# Patient Record
Sex: Male | Born: 1935 | Race: White | Hispanic: No | Marital: Married | Smoking: Former smoker
Health system: Southern US, Community
[De-identification: ages and names within clinical notes are randomized; demographics above are authoritative.]

## PROBLEM LIST (undated history)

## (undated) DIAGNOSIS — K219 Gastro-esophageal reflux disease without esophagitis: Secondary | ICD-10-CM

## (undated) DIAGNOSIS — E7800 Pure hypercholesterolemia, unspecified: Secondary | ICD-10-CM

## (undated) DIAGNOSIS — N429 Disorder of prostate, unspecified: Secondary | ICD-10-CM

## (undated) DIAGNOSIS — N400 Enlarged prostate without lower urinary tract symptoms: Secondary | ICD-10-CM

## (undated) DIAGNOSIS — M419 Scoliosis, unspecified: Secondary | ICD-10-CM

## (undated) DIAGNOSIS — B341 Enterovirus infection, unspecified: Secondary | ICD-10-CM

## (undated) DIAGNOSIS — R61 Generalized hyperhidrosis: Secondary | ICD-10-CM

## (undated) DIAGNOSIS — H409 Unspecified glaucoma: Secondary | ICD-10-CM

## (undated) DIAGNOSIS — C801 Malignant (primary) neoplasm, unspecified: Secondary | ICD-10-CM

## (undated) DIAGNOSIS — M1990 Unspecified osteoarthritis, unspecified site: Secondary | ICD-10-CM

## (undated) DIAGNOSIS — J189 Pneumonia, unspecified organism: Secondary | ICD-10-CM

## (undated) DIAGNOSIS — M549 Dorsalgia, unspecified: Secondary | ICD-10-CM

## (undated) DIAGNOSIS — M1711 Unilateral primary osteoarthritis, right knee: Secondary | ICD-10-CM

## (undated) HISTORY — DX: Unspecified osteoarthritis, unspecified site: M19.90

## (undated) HISTORY — DX: Benign prostatic hyperplasia without lower urinary tract symptoms: N40.0

## (undated) HISTORY — PX: TUMOR EXCISION: SHX421

## (undated) HISTORY — PX: KNEE ARTHROSCOPY W/ MENISCECTOMY: SHX1879

## (undated) HISTORY — DX: Enterovirus infection, unspecified: B34.1

## (undated) HISTORY — DX: Generalized hyperhidrosis: R61

## (undated) HISTORY — DX: Gastro-esophageal reflux disease without esophagitis: K21.9

## (undated) HISTORY — PX: EYE SURGERY: SHX253

## (undated) HISTORY — PX: TONSILLECTOMY: SUR1361

## (undated) HISTORY — DX: Pure hypercholesterolemia, unspecified: E78.00

---

## 1967-09-17 HISTORY — PX: APPENDECTOMY: SHX54

## 2014-04-21 ENCOUNTER — Encounter (INDEPENDENT_AMBULATORY_CARE_PROVIDER_SITE_OTHER): Payer: Self-pay | Admitting: *Deleted

## 2014-05-19 ENCOUNTER — Ambulatory Visit (INDEPENDENT_AMBULATORY_CARE_PROVIDER_SITE_OTHER): Payer: Self-pay | Admitting: Internal Medicine

## 2014-08-31 ENCOUNTER — Encounter (HOSPITAL_COMMUNITY): Payer: Self-pay | Admitting: Physician Assistant

## 2014-08-31 DIAGNOSIS — M549 Dorsalgia, unspecified: Secondary | ICD-10-CM | POA: Diagnosis present

## 2014-08-31 DIAGNOSIS — H409 Unspecified glaucoma: Secondary | ICD-10-CM | POA: Diagnosis present

## 2014-08-31 DIAGNOSIS — M1711 Unilateral primary osteoarthritis, right knee: Secondary | ICD-10-CM | POA: Diagnosis present

## 2014-08-31 DIAGNOSIS — N429 Disorder of prostate, unspecified: Secondary | ICD-10-CM | POA: Diagnosis present

## 2014-08-31 DIAGNOSIS — K219 Gastro-esophageal reflux disease without esophagitis: Secondary | ICD-10-CM | POA: Diagnosis present

## 2014-08-31 NOTE — H&P (Signed)
TOTAL KNEE ADMISSION H&P  Patient is being admitted for right total knee arthroplasty.  Subjective:  Chief Complaint:right knee pain.  HPI: Jeffrey Wheeler, 78 y.o. male, has a history of pain and functional disability in the right knee due to arthritis and has failed non-surgical conservative treatments for greater than 12 weeks to includeNSAID's and/or analgesics, corticosteriod injections, viscosupplementation injections, flexibility and strengthening excercises, supervised PT with diminished ADL's post treatment, use of assistive devices and activity modification.  Onset of symptoms was gradual, starting 10 years ago with gradually worsening course since that time. The patient noted prior procedures on the knee to include  arthroscopy and menisectomy on the right knee(s).  Patient currently rates pain in the right knee(s) at 10 out of 10 with activity. Patient has night pain, worsening of pain with activity and weight bearing, pain that interferes with activities of daily living, crepitus and joint swelling.  Patient has evidence of subchondral sclerosis, periarticular osteophytes and joint space narrowing by imaging studies. There is no active infection.  Patient Active Problem List   Diagnosis Date Noted  . Primary localized osteoarthritis of right knee   . Glaucoma   . Prostate disease   . Gastroesophageal reflux disease   . Back pain    Past Medical History  Diagnosis Date  . Back pain   . Gastroesophageal reflux disease   . Prostate disease   . Glaucoma   . Primary localized osteoarthritis of right knee   . Scoliosis     Past Surgical History  Procedure Laterality Date  . Tumor excision Right 40 yrs ago    calf  . Knee arthroscopy w/ meniscectomy Right   . Appendectomy  1969    No prescriptions prior to admission  No Known Allergies   No current facility-administered medications for this encounter. Current outpatient prescriptions: acetaminophen (TYLENOL) 325 MG tablet,  Take 650 mg by mouth every 6 (six) hours as needed., Disp: , Rfl: ;  aspirin EC 81 MG tablet, Take 81 mg by mouth daily., Disp: , Rfl: ;  doxazosin (CARDURA) 8 MG tablet, Take 8 mg by mouth daily., Disp: , Rfl: ;  etodolac (LODINE) 400 MG tablet, Take 400 mg by mouth 2 (two) times daily., Disp: , Rfl:  finasteride (PROSCAR) 5 MG tablet, Take 5 mg by mouth daily., Disp: , Rfl: ;  HYDROcodone-acetaminophen (NORCO/VICODIN) 5-325 MG per tablet, Take 1 tablet by mouth every 6 (six) hours as needed for moderate pain., Disp: , Rfl: ;  latanoprost (XALATAN) 0.005 % ophthalmic solution, Place 1 drop into both eyes at bedtime., Disp: , Rfl: ;  mirabegron ER (MYRBETRIQ) 25 MG TB24 tablet, Take 25 mg by mouth daily., Disp: , Rfl:  pantoprazole (PROTONIX) 40 MG tablet, Take 40 mg by mouth daily., Disp: , Rfl: ;  senna (SENOKOT) 8.6 MG tablet, Take 1 tablet by mouth daily., Disp: , Rfl: ;  dorzolamide-timolol (COSOPT) 22.3-6.8 MG/ML ophthalmic solution, , Disp: , Rfl: 12 History  Substance Use Topics  . Smoking status: Never Smoker   . Smokeless tobacco: Former Systems developer    Quit date: 08/31/2000  . Alcohol Use: 0.0 oz/week    0 Not specified per week    Family History  Problem Relation Age of Onset  . Diabetes    . CAD    . Hypertension    . CVA       Review of Systems  Constitutional: Negative.   HENT: Negative.   Eyes: Negative.   Gastrointestinal: Positive for nausea,  abdominal pain and constipation. Negative for diarrhea.       Left lower quadrant  Genitourinary: Negative for dysuria, urgency, frequency, hematuria and flank pain.  Musculoskeletal: Positive for back pain and joint pain.  Skin: Negative.   Neurological: Negative for dizziness, tingling, tremors, sensory change, speech change, focal weakness, seizures and loss of consciousness.  Endo/Heme/Allergies: Negative for environmental allergies and polydipsia. Does not bruise/bleed easily.  Psychiatric/Behavioral: Negative.      Objective:  Physical Exam  Constitutional: He is oriented to person, place, and time. He appears well-developed and well-nourished.  GI: Soft. There is tenderness.  Left lower quadrant pain  Musculoskeletal:  Examination of his right knee reveals pain medially and laterally, 1+ crepitation 1+ synovitis full range of motion knee is stable with normal patella tracking. Exam of the left knee reveals full range of motion without pain swelling weakness or instability. Vascular exam: pulses 2+ and symmetric.   Neurological: He is alert and oriented to person, place, and time.  Skin: Skin is warm and dry.  Psychiatric: He has a normal mood and affect. His behavior is normal.    Vital signs in last 24 hours: Pulse Rate:  [69] 69 (12/16 1500) BP: (155)/(88) 155/88 mmHg (12/16 1500) SpO2:  [96 %] 96 % (12/16 1500) Weight:  [94.802 kg (209 lb)] 94.802 kg (209 lb) (12/16 1500)  Labs:   Estimated body mass index is 31.79 kg/(m^2) as calculated from the following:   Height as of this encounter: 5\' 8"  (1.727 m).   Weight as of this encounter: 94.802 kg (209 lb).   Imaging Review Plain radiographs demonstrate severe degenerative joint disease of the right knee(s). The overall alignment issignificant varus. The bone quality appears to be good for age and reported activity level.  Assessment/Plan:  End stage arthritis, right knee  Active Problems:   Primary localized osteoarthritis of right knee   Glaucoma   Prostate disease   Gastroesophageal reflux disease   Back pain   The patient history, physical examination, clinical judgment of the provider and imaging studies are consistent with end stage degenerative joint disease of the right knee(s) and total knee arthroplasty is deemed medically necessary. The treatment options including medical management, injection therapy arthroscopy and arthroplasty were discussed at length. The risks and benefits of total knee arthroplasty were presented  and reviewed. The risks due to aseptic loosening, infection, stiffness, patella tracking problems, thromboembolic complications and other imponderables were discussed. The patient acknowledged the explanation, agreed to proceed with the plan and consent was signed. Patient is being admitted for inpatient treatment for surgery, pain control, PT, OT, prophylactic antibiotics, VTE prophylaxis, progressive ambulation and ADL's and discharge planning. The patient is planning to be discharged home with home health services  Lendy Dittrich A. Kaleen Mask Physician Assistant [facility] [phone]  08/31/2014, 3:26 PM

## 2014-09-02 ENCOUNTER — Encounter (HOSPITAL_COMMUNITY): Payer: Self-pay

## 2014-09-02 ENCOUNTER — Encounter (HOSPITAL_COMMUNITY)
Admission: RE | Admit: 2014-09-02 | Discharge: 2014-09-02 | Disposition: A | Discharge status: Home / Self Care | Payer: Medicare HMO | Source: Ambulatory Visit | Attending: Orthopedic Surgery | Admitting: Orthopedic Surgery

## 2014-09-02 DIAGNOSIS — Z01818 Encounter for other preprocedural examination: Secondary | ICD-10-CM | POA: Insufficient documentation

## 2014-09-02 LAB — APTT: aPTT: 31 seconds (ref 24–37)

## 2014-09-02 LAB — COMPREHENSIVE METABOLIC PANEL
ALT: 14 U/L (ref 0–53)
ANION GAP: 10 (ref 5–15)
AST: 16 U/L (ref 0–37)
Albumin: 3.4 g/dL — ABNORMAL LOW (ref 3.5–5.2)
Alkaline Phosphatase: 111 U/L (ref 39–117)
BUN: 18 mg/dL (ref 6–23)
CALCIUM: 9.7 mg/dL (ref 8.4–10.5)
CO2: 27 meq/L (ref 19–32)
Chloride: 101 mEq/L (ref 96–112)
Creatinine, Ser: 1.01 mg/dL (ref 0.50–1.35)
GFR calc Af Amer: 82 mL/min — ABNORMAL LOW (ref >90)
GFR, EST NON AFRICAN AMERICAN: 71 mL/min — AB (ref >90)
Glucose, Bld: 85 mg/dL (ref 70–99)
Potassium: 4.7 mEq/L (ref 3.7–5.3)
Sodium: 138 mEq/L (ref 137–147)
TOTAL PROTEIN: 6.6 g/dL (ref 6.0–8.3)
Total Bilirubin: 0.3 mg/dL (ref 0.3–1.2)

## 2014-09-02 LAB — CBC WITH DIFFERENTIAL/PLATELET
Basophils Absolute: 0 10*3/uL (ref 0.0–0.1)
Basophils Relative: 0 % (ref 0–1)
EOS ABS: 0.2 10*3/uL (ref 0.0–0.7)
EOS PCT: 3 % (ref 0–5)
HCT: 44.1 % (ref 39.0–52.0)
HEMOGLOBIN: 14.8 g/dL (ref 13.0–17.0)
LYMPHS ABS: 1.8 10*3/uL (ref 0.7–4.0)
Lymphocytes Relative: 22 % (ref 12–46)
MCH: 29.5 pg (ref 26.0–34.0)
MCHC: 33.6 g/dL (ref 30.0–36.0)
MCV: 88 fL (ref 78.0–100.0)
Monocytes Absolute: 0.7 10*3/uL (ref 0.1–1.0)
Monocytes Relative: 8 % (ref 3–12)
Neutro Abs: 5.3 10*3/uL (ref 1.7–7.7)
Neutrophils Relative %: 67 % (ref 43–77)
Platelets: 234 10*3/uL (ref 150–400)
RBC: 5.01 MIL/uL (ref 4.22–5.81)
RDW: 13.8 % (ref 11.5–15.5)
WBC: 8 10*3/uL (ref 4.0–10.5)

## 2014-09-02 LAB — URINALYSIS, ROUTINE W REFLEX MICROSCOPIC
Glucose, UA: NEGATIVE mg/dL
HGB URINE DIPSTICK: NEGATIVE
Ketones, ur: NEGATIVE mg/dL
Leukocytes, UA: NEGATIVE
Nitrite: NEGATIVE
PROTEIN: NEGATIVE mg/dL
SPECIFIC GRAVITY, URINE: 1.026 (ref 1.005–1.030)
Urobilinogen, UA: 0.2 mg/dL (ref 0.0–1.0)
pH: 5 (ref 5.0–8.0)

## 2014-09-02 LAB — TYPE AND SCREEN
ABO/RH(D): O POS
Antibody Screen: NEGATIVE

## 2014-09-02 LAB — SURGICAL PCR SCREEN
MRSA, PCR: NEGATIVE
Staphylococcus aureus: NEGATIVE

## 2014-09-02 LAB — PROTIME-INR
INR: 1 (ref 0.00–1.49)
Prothrombin Time: 13.3 seconds (ref 11.6–15.2)

## 2014-09-02 LAB — ABO/RH: ABO/RH(D): O POS

## 2014-09-02 NOTE — Pre-Procedure Instructions (Signed)
Birney Belshe  09/02/2014   Your procedure is scheduled on:  Monday, Dec. 28th   Report to [facility] Admitting at  8:30 AM.  Call this number if you have problems the morning of surgery: [phone]   Remember:   Do not eat food or drink liquids after midnight Sunday.   Take these medicines the morning of surgery with A SIP OF WATER: Cardura, Proscar, Hydrocodone, Protonix   Do not wear jewelry - no rings or watches.  Do not wear lotions or colognes.   You may NOT wear deodorant the morning of surgery.   Men may shave face and neck.   Do not bring valuables to the hospital.  [facility] is not responsible  for any belongings or valuables.               Contacts, dentures or bridgework may not be worn into surgery.  Leave suitcase in the car. After surgery it may be brought to your room.  For patients admitted to the hospital, discharge time is determined by your treatment team.               Name and phone number of your driver:    Special Instructions: "Preparing for Surgery" Instruction Sheet.   Please read over the following fact sheets that you were given: Pain Booklet, Coughing and Deep Breathing, Blood Transfusion Information, MRSA Information and Surgical Site Infection Prevention

## 2014-09-02 NOTE — Progress Notes (Signed)
Cardura is taken for his prostate per his wife.

## 2014-09-03 LAB — URINE CULTURE: Colony Count: 4000

## 2014-09-11 MED ORDER — CEFAZOLIN SODIUM-DEXTROSE 2-3 GM-% IV SOLR
2.0000 g | INTRAVENOUS | Status: AC
  Administered 2014-09-12: 2 g via INTRAVENOUS
  Filled 2014-09-11: qty 50

## 2014-09-11 MED ORDER — CHLORHEXIDINE GLUCONATE 4 % EX LIQD
60.0000 mL | Freq: Once | CUTANEOUS | Status: DC
  Filled 2014-09-11: qty 60

## 2014-09-11 MED ORDER — LACTATED RINGERS IV SOLN
INTRAVENOUS | Status: DC
  Administered 2014-09-12: 09:00:00 via INTRAVENOUS

## 2014-09-11 MED ORDER — POVIDONE-IODINE 7.5 % EX SOLN
Freq: Once | CUTANEOUS | Status: DC
  Filled 2014-09-11: qty 118

## 2014-09-12 ENCOUNTER — Encounter (HOSPITAL_COMMUNITY)
Admission: RE | Disposition: A | Discharge status: Home / Self Care | Payer: Self-pay | Source: Ambulatory Visit | Attending: Orthopedic Surgery

## 2014-09-12 ENCOUNTER — Inpatient Hospital Stay (HOSPITAL_COMMUNITY)
Admission: RE | Admit: 2014-09-12 | Discharge: 2014-09-14 | DRG: 470 | Disposition: A | Discharge status: Home / Self Care | Payer: Medicare HMO | Source: Ambulatory Visit | Attending: Orthopedic Surgery | Admitting: Orthopedic Surgery

## 2014-09-12 ENCOUNTER — Inpatient Hospital Stay (HOSPITAL_COMMUNITY): Payer: Medicare HMO | Admitting: Certified Registered Nurse Anesthetist

## 2014-09-12 ENCOUNTER — Encounter (HOSPITAL_COMMUNITY): Payer: Self-pay | Admitting: Certified Registered Nurse Anesthetist

## 2014-09-12 DIAGNOSIS — M1711 Unilateral primary osteoarthritis, right knee: Principal | ICD-10-CM | POA: Diagnosis present

## 2014-09-12 DIAGNOSIS — H409 Unspecified glaucoma: Secondary | ICD-10-CM | POA: Diagnosis present

## 2014-09-12 DIAGNOSIS — M1710 Unilateral primary osteoarthritis, unspecified knee: Secondary | ICD-10-CM | POA: Diagnosis present

## 2014-09-12 DIAGNOSIS — Z87891 Personal history of nicotine dependence: Secondary | ICD-10-CM | POA: Diagnosis not present

## 2014-09-12 DIAGNOSIS — N429 Disorder of prostate, unspecified: Secondary | ICD-10-CM | POA: Diagnosis present

## 2014-09-12 DIAGNOSIS — R1032 Left lower quadrant pain: Secondary | ICD-10-CM | POA: Diagnosis present

## 2014-09-12 DIAGNOSIS — M25561 Pain in right knee: Secondary | ICD-10-CM | POA: Diagnosis present

## 2014-09-12 DIAGNOSIS — M179 Osteoarthritis of knee, unspecified: Secondary | ICD-10-CM | POA: Diagnosis present

## 2014-09-12 DIAGNOSIS — K219 Gastro-esophageal reflux disease without esophagitis: Secondary | ICD-10-CM | POA: Diagnosis present

## 2014-09-12 DIAGNOSIS — R822 Biliuria: Secondary | ICD-10-CM | POA: Diagnosis present

## 2014-09-12 DIAGNOSIS — R198 Other specified symptoms and signs involving the digestive system and abdomen: Secondary | ICD-10-CM | POA: Diagnosis present

## 2014-09-12 DIAGNOSIS — M549 Dorsalgia, unspecified: Secondary | ICD-10-CM | POA: Diagnosis present

## 2014-09-12 HISTORY — DX: Gastro-esophageal reflux disease without esophagitis: K21.9

## 2014-09-12 HISTORY — DX: Unspecified glaucoma: H40.9

## 2014-09-12 HISTORY — DX: Dorsalgia, unspecified: M54.9

## 2014-09-12 HISTORY — DX: Disorder of prostate, unspecified: N42.9

## 2014-09-12 HISTORY — DX: Scoliosis, unspecified: M41.9

## 2014-09-12 HISTORY — DX: Unilateral primary osteoarthritis, right knee: M17.11

## 2014-09-12 HISTORY — PX: TOTAL KNEE ARTHROPLASTY: SHX125

## 2014-09-12 SURGERY — ARTHROPLASTY, KNEE, TOTAL
Anesthesia: Monitor Anesthesia Care | Site: Knee | Laterality: Right

## 2014-09-12 MED ORDER — ONDANSETRON HCL 4 MG/2ML IJ SOLN
INTRAMUSCULAR | Status: DC | PRN
  Administered 2014-09-12: 4 mg via INTRAVENOUS

## 2014-09-12 MED ORDER — MIDAZOLAM HCL 5 MG/5ML IJ SOLN
INTRAMUSCULAR | Status: DC | PRN
  Administered 2014-09-12 (×2): 1 mg via INTRAVENOUS

## 2014-09-12 MED ORDER — OXYCODONE HCL 5 MG PO TABS: 5.0000 mg | ORAL_TABLET | Freq: Once | ORAL | Status: DC | PRN

## 2014-09-12 MED ORDER — DORZOLAMIDE HCL-TIMOLOL MAL 2-0.5 % OP SOLN
1.0000 [drp] | Freq: Every day | OPHTHALMIC | Status: DC
  Administered 2014-09-12 – 2014-09-13 (×2): 1 [drp] via OPHTHALMIC
  Filled 2014-09-12: qty 10

## 2014-09-12 MED ORDER — ONDANSETRON HCL 4 MG/2ML IJ SOLN
INTRAMUSCULAR | Status: AC
  Filled 2014-09-12: qty 2

## 2014-09-12 MED ORDER — PANTOPRAZOLE SODIUM 40 MG PO TBEC
40.0000 mg | DELAYED_RELEASE_TABLET | Freq: Every day | ORAL | Status: DC
  Administered 2014-09-12 – 2014-09-14 (×3): 40 mg via ORAL
  Filled 2014-09-12 (×3): qty 1

## 2014-09-12 MED ORDER — CEFAZOLIN SODIUM-DEXTROSE 2-3 GM-% IV SOLR
INTRAVENOUS | Status: AC
  Filled 2014-09-12: qty 50

## 2014-09-12 MED ORDER — DIPHENHYDRAMINE HCL 12.5 MG/5ML PO ELIX: 12.5000 mg | ORAL_SOLUTION | ORAL | Status: DC | PRN

## 2014-09-12 MED ORDER — CEFAZOLIN SODIUM-DEXTROSE 2-3 GM-% IV SOLR
2.0000 g | Freq: Four times a day (QID) | INTRAVENOUS | Status: AC
  Administered 2014-09-12 (×2): 2 g via INTRAVENOUS
  Filled 2014-09-12: qty 50

## 2014-09-12 MED ORDER — FINASTERIDE 5 MG PO TABS
5.0000 mg | ORAL_TABLET | Freq: Every day | ORAL | Status: DC
  Administered 2014-09-12 – 2014-09-14 (×3): 5 mg via ORAL
  Filled 2014-09-12 (×3): qty 1

## 2014-09-12 MED ORDER — LACTATED RINGERS IV SOLN
INTRAVENOUS | Status: DC | PRN
  Administered 2014-09-12 (×3): via INTRAVENOUS

## 2014-09-12 MED ORDER — METOCLOPRAMIDE HCL 5 MG/ML IJ SOLN: 5.0000 mg | Freq: Three times a day (TID) | INTRAMUSCULAR | Status: DC | PRN

## 2014-09-12 MED ORDER — SENNA 8.6 MG PO TABS
1.0000 | ORAL_TABLET | Freq: Two times a day (BID) | ORAL | Status: DC
  Administered 2014-09-12 – 2014-09-13 (×3): 8.6 mg via ORAL
  Filled 2014-09-12 (×5): qty 1

## 2014-09-12 MED ORDER — FENTANYL CITRATE 0.05 MG/ML IJ SOLN
INTRAMUSCULAR | Status: DC | PRN
  Administered 2014-09-12 (×2): 50 ug via INTRAVENOUS
  Administered 2014-09-12: 25 ug via INTRAVENOUS
  Administered 2014-09-12 (×2): 50 ug via INTRAVENOUS
  Administered 2014-09-12: 25 ug via INTRAVENOUS

## 2014-09-12 MED ORDER — MIDAZOLAM HCL 2 MG/2ML IJ SOLN
INTRAMUSCULAR | Status: AC
  Filled 2014-09-12: qty 2

## 2014-09-12 MED ORDER — HYDROMORPHONE HCL 1 MG/ML IJ SOLN
INTRAMUSCULAR | Status: AC
  Filled 2014-09-12: qty 1

## 2014-09-12 MED ORDER — MIRABEGRON ER 25 MG PO TB24
25.0000 mg | ORAL_TABLET | Freq: Every day | ORAL | Status: DC
  Administered 2014-09-12 – 2014-09-14 (×3): 25 mg via ORAL
  Filled 2014-09-12 (×4): qty 1

## 2014-09-12 MED ORDER — ALUM & MAG HYDROXIDE-SIMETH 200-200-20 MG/5ML PO SUSP
30.0000 mL | ORAL | Status: DC | PRN
  Administered 2014-09-13: 30 mL via ORAL
  Filled 2014-09-12: qty 30

## 2014-09-12 MED ORDER — OXYCODONE HCL 5 MG PO TABS
ORAL_TABLET | ORAL | Status: AC
  Filled 2014-09-12: qty 2

## 2014-09-12 MED ORDER — LATANOPROST 0.005 % OP SOLN
1.0000 [drp] | Freq: Every day | OPHTHALMIC | Status: DC
  Administered 2014-09-12 – 2014-09-13 (×2): 1 [drp] via OPHTHALMIC
  Filled 2014-09-12: qty 2.5

## 2014-09-12 MED ORDER — EPHEDRINE SULFATE 50 MG/ML IJ SOLN
INTRAMUSCULAR | Status: AC
  Filled 2014-09-12: qty 1

## 2014-09-12 MED ORDER — MENTHOL 3 MG MT LOZG: 1.0000 | LOZENGE | OROMUCOSAL | Status: DC | PRN

## 2014-09-12 MED ORDER — ONDANSETRON HCL 4 MG PO TABS: 4.0000 mg | ORAL_TABLET | Freq: Four times a day (QID) | ORAL | Status: DC | PRN

## 2014-09-12 MED ORDER — ACETAMINOPHEN 325 MG PO TABS: 325.0000 mg | ORAL_TABLET | ORAL | Status: DC | PRN

## 2014-09-12 MED ORDER — PROPOFOL 10 MG/ML IV BOLUS
INTRAVENOUS | Status: DC | PRN
  Administered 2014-09-12 (×2): 20 mg via INTRAVENOUS
  Administered 2014-09-12: 300 mg via INTRAVENOUS
  Administered 2014-09-12: 20 mg via INTRAVENOUS

## 2014-09-12 MED ORDER — SODIUM CHLORIDE 0.9 % IR SOLN
Status: DC | PRN
  Administered 2014-09-12: 1000 mL

## 2014-09-12 MED ORDER — METOCLOPRAMIDE HCL 10 MG PO TABS: 5.0000 mg | ORAL_TABLET | Freq: Three times a day (TID) | ORAL | Status: DC | PRN

## 2014-09-12 MED ORDER — RIVAROXABAN 10 MG PO TABS
10.0000 mg | ORAL_TABLET | Freq: Every day | ORAL | Status: DC
  Administered 2014-09-13 – 2014-09-14 (×2): 10 mg via ORAL
  Filled 2014-09-12 (×3): qty 1

## 2014-09-12 MED ORDER — ONDANSETRON HCL 4 MG/2ML IJ SOLN: 4.0000 mg | Freq: Once | INTRAMUSCULAR | Status: DC | PRN

## 2014-09-12 MED ORDER — FENTANYL CITRATE 0.05 MG/ML IJ SOLN
INTRAMUSCULAR | Status: AC
  Filled 2014-09-12: qty 2

## 2014-09-12 MED ORDER — OXYCODONE HCL 5 MG/5ML PO SOLN: 5.0000 mg | Freq: Once | ORAL | Status: DC | PRN

## 2014-09-12 MED ORDER — PHENYLEPHRINE HCL 10 MG/ML IJ SOLN
INTRAMUSCULAR | Status: DC | PRN
  Administered 2014-09-12 (×2): 80 ug via INTRAVENOUS

## 2014-09-12 MED ORDER — OXYCODONE HCL 5 MG PO TABS
5.0000 mg | ORAL_TABLET | ORAL | Status: DC | PRN
  Administered 2014-09-12 – 2014-09-14 (×10): 10 mg via ORAL
  Filled 2014-09-12 (×9): qty 2

## 2014-09-12 MED ORDER — HYDROMORPHONE HCL 1 MG/ML IJ SOLN
0.2500 mg | INTRAMUSCULAR | Status: DC | PRN
  Administered 2014-09-12 (×4): 0.5 mg via INTRAVENOUS

## 2014-09-12 MED ORDER — EPHEDRINE SULFATE 50 MG/ML IJ SOLN
INTRAMUSCULAR | Status: DC | PRN
  Administered 2014-09-12 (×2): 10 mg via INTRAVENOUS
  Administered 2014-09-12: 5 mg via INTRAVENOUS

## 2014-09-12 MED ORDER — PROPOFOL 10 MG/ML IV BOLUS
INTRAVENOUS | Status: AC
  Filled 2014-09-12: qty 20

## 2014-09-12 MED ORDER — SODIUM CHLORIDE 0.9 % IR SOLN
Status: DC | PRN
  Administered 2014-09-12: 3000 mL

## 2014-09-12 MED ORDER — FENTANYL CITRATE 0.05 MG/ML IJ SOLN
INTRAMUSCULAR | Status: AC
  Filled 2014-09-12: qty 5

## 2014-09-12 MED ORDER — POTASSIUM CHLORIDE IN NACL 20-0.9 MEQ/L-% IV SOLN
INTRAVENOUS | Status: DC
  Administered 2014-09-13: 10:00:00 via INTRAVENOUS
  Filled 2014-09-12 (×8): qty 1000

## 2014-09-12 MED ORDER — BUPIVACAINE-EPINEPHRINE 0.25% -1:200000 IJ SOLN
INTRAMUSCULAR | Status: DC | PRN
  Administered 2014-09-12: 30 mL

## 2014-09-12 MED ORDER — DOXAZOSIN MESYLATE 8 MG PO TABS
8.0000 mg | ORAL_TABLET | Freq: Every day | ORAL | Status: DC
  Administered 2014-09-12 – 2014-09-14 (×3): 8 mg via ORAL
  Filled 2014-09-12 (×3): qty 1

## 2014-09-12 MED ORDER — ACETAMINOPHEN 160 MG/5ML PO SOLN
325.0000 mg | ORAL | Status: DC | PRN
  Filled 2014-09-12: qty 20.3

## 2014-09-12 MED ORDER — DEXAMETHASONE SODIUM PHOSPHATE 10 MG/ML IJ SOLN
10.0000 mg | Freq: Three times a day (TID) | INTRAMUSCULAR | Status: DC
  Administered 2014-09-12 – 2014-09-14 (×6): 10 mg via INTRAVENOUS
  Filled 2014-09-12 (×8): qty 1

## 2014-09-12 MED ORDER — ACETAMINOPHEN 325 MG PO TABS: 650.0000 mg | ORAL_TABLET | Freq: Four times a day (QID) | ORAL | Status: DC | PRN

## 2014-09-12 MED ORDER — HYDROMORPHONE HCL 1 MG/ML IJ SOLN
1.0000 mg | INTRAMUSCULAR | Status: DC | PRN
  Administered 2014-09-12 – 2014-09-13 (×3): 1 mg via INTRAVENOUS
  Filled 2014-09-12 (×3): qty 1

## 2014-09-12 MED ORDER — CELECOXIB 200 MG PO CAPS
200.0000 mg | ORAL_CAPSULE | Freq: Two times a day (BID) | ORAL | Status: DC
  Administered 2014-09-12 – 2014-09-14 (×4): 200 mg via ORAL
  Filled 2014-09-12 (×5): qty 1

## 2014-09-12 MED ORDER — PHENOL 1.4 % MT LIQD: 1.0000 | OROMUCOSAL | Status: DC | PRN

## 2014-09-12 MED ORDER — SODIUM CHLORIDE 0.9 % IJ SOLN
INTRAMUSCULAR | Status: AC
  Filled 2014-09-12: qty 10

## 2014-09-12 MED ORDER — BUPIVACAINE-EPINEPHRINE (PF) 0.25% -1:200000 IJ SOLN
INTRAMUSCULAR | Status: AC
  Filled 2014-09-12: qty 30

## 2014-09-12 MED ORDER — ACETAMINOPHEN 650 MG RE SUPP: 650.0000 mg | Freq: Four times a day (QID) | RECTAL | Status: DC | PRN

## 2014-09-12 MED ORDER — WHITE PETROLATUM GEL
Status: AC
  Filled 2014-09-12: qty 5

## 2014-09-12 MED ORDER — ONDANSETRON HCL 4 MG/2ML IJ SOLN
4.0000 mg | Freq: Four times a day (QID) | INTRAMUSCULAR | Status: DC | PRN
  Administered 2014-09-12: 4 mg via INTRAVENOUS

## 2014-09-12 SURGICAL SUPPLY — 66 items
BANDAGE ESMARK 6X9 LF (GAUZE/BANDAGES/DRESSINGS) ×1 IMPLANT
BENZOIN TINCTURE PRP APPL 2/3 (GAUZE/BANDAGES/DRESSINGS) ×2 IMPLANT
BLADE SAGITTAL 25.0X1.19X90 (BLADE) ×2 IMPLANT
BLADE SAW SGTL 11.0X1.19X90.0M (BLADE) IMPLANT
BLADE SAW SGTL 13.0X1.19X90.0M (BLADE) ×2 IMPLANT
BLADE SURG 10 STRL SS (BLADE) ×2 IMPLANT
BNDG ELASTIC 6X15 VLCR STRL LF (GAUZE/BANDAGES/DRESSINGS) ×2 IMPLANT
BNDG ESMARK 6X9 LF (GAUZE/BANDAGES/DRESSINGS) ×2
BOWL SMART MIX CTS (DISPOSABLE) ×2 IMPLANT
CAP KNEE TOTAL 3 SIGMA ×2 IMPLANT
CEMENT HV SMART SET (Cement) ×4 IMPLANT
COVER SURGICAL LIGHT HANDLE (MISCELLANEOUS) ×2 IMPLANT
CUFF TOURNIQUET SINGLE 34IN LL (TOURNIQUET CUFF) ×2 IMPLANT
CUFF TOURNIQUET SINGLE 44IN (TOURNIQUET CUFF) IMPLANT
DRAPE EXTREMITY T 121X128X90 (DRAPE) ×2 IMPLANT
DRAPE IMP U-DRAPE 54X76 (DRAPES) ×2 IMPLANT
DRAPE INCISE IOBAN 66X45 STRL (DRAPES) ×2 IMPLANT
DRAPE PROXIMA HALF (DRAPES) ×2 IMPLANT
DRAPE U-SHAPE 47X51 STRL (DRAPES) ×2 IMPLANT
DRSG AQUACEL AG ADV 3.5X14 (GAUZE/BANDAGES/DRESSINGS) ×2 IMPLANT
DRSG PAD ABDOMINAL 8X10 ST (GAUZE/BANDAGES/DRESSINGS) ×4 IMPLANT
DURAPREP 26ML APPLICATOR (WOUND CARE) ×4 IMPLANT
ELECT CAUTERY BLADE 6.4 (BLADE) ×2 IMPLANT
ELECT REM PT RETURN 9FT ADLT (ELECTROSURGICAL) ×2
ELECTRODE REM PT RTRN 9FT ADLT (ELECTROSURGICAL) ×1 IMPLANT
EVACUATOR 1/8 PVC DRAIN (DRAIN) ×2 IMPLANT
FACESHIELD WRAPAROUND (MASK) ×4 IMPLANT
GAUZE SPONGE 4X4 12PLY STRL (GAUZE/BANDAGES/DRESSINGS) ×2 IMPLANT
GLOVE BIO SURGEON STRL SZ7 (GLOVE) ×2 IMPLANT
GLOVE BIOGEL PI IND STRL 7.0 (GLOVE) ×1 IMPLANT
GLOVE BIOGEL PI IND STRL 7.5 (GLOVE) ×1 IMPLANT
GLOVE BIOGEL PI INDICATOR 7.0 (GLOVE) ×1
GLOVE BIOGEL PI INDICATOR 7.5 (GLOVE) ×1
GLOVE SS BIOGEL STRL SZ 7.5 (GLOVE) ×1 IMPLANT
GLOVE SUPERSENSE BIOGEL SZ 7.5 (GLOVE) ×1
GOWN STRL REUS W/ TWL LRG LVL3 (GOWN DISPOSABLE) ×2 IMPLANT
GOWN STRL REUS W/ TWL XL LVL3 (GOWN DISPOSABLE) ×2 IMPLANT
GOWN STRL REUS W/TWL LRG LVL3 (GOWN DISPOSABLE) ×2
GOWN STRL REUS W/TWL XL LVL3 (GOWN DISPOSABLE) ×2
HANDPIECE INTERPULSE COAX TIP (DISPOSABLE) ×1
HOOD PEEL AWAY FACE SHEILD DIS (HOOD) ×4 IMPLANT
IMMOBILIZER KNEE 22 UNIV (SOFTGOODS) ×2 IMPLANT
KIT BASIN OR (CUSTOM PROCEDURE TRAY) ×2 IMPLANT
KIT ROOM TURNOVER OR (KITS) ×2 IMPLANT
MANIFOLD NEPTUNE II (INSTRUMENTS) ×2 IMPLANT
MARKER SKIN DUAL TIP RULER LAB (MISCELLANEOUS) ×2 IMPLANT
NS IRRIG 1000ML POUR BTL (IV SOLUTION) ×2 IMPLANT
PACK TOTAL JOINT (CUSTOM PROCEDURE TRAY) ×2 IMPLANT
PACK UNIVERSAL I (CUSTOM PROCEDURE TRAY) ×2 IMPLANT
PAD ARMBOARD 7.5X6 YLW CONV (MISCELLANEOUS) ×4 IMPLANT
PADDING CAST COTTON 6X4 STRL (CAST SUPPLIES) ×2 IMPLANT
RUBBERBAND STERILE (MISCELLANEOUS) ×2 IMPLANT
SET HNDPC FAN SPRY TIP SCT (DISPOSABLE) ×1 IMPLANT
STRIP CLOSURE SKIN 1/2X4 (GAUZE/BANDAGES/DRESSINGS) ×4 IMPLANT
SUCTION FRAZIER TIP 10 FR DISP (SUCTIONS) ×2 IMPLANT
SUT ETHIBOND NAB CT1 #1 30IN (SUTURE) ×2 IMPLANT
SUT MNCRL AB 3-0 PS2 18 (SUTURE) ×2 IMPLANT
SUT VIC AB 0 CT1 27 (SUTURE) ×2
SUT VIC AB 0 CT1 27XBRD ANBCTR (SUTURE) ×2 IMPLANT
SUT VIC AB 2-0 CT1 27 (SUTURE) ×2
SUT VIC AB 2-0 CT1 TAPERPNT 27 (SUTURE) ×2 IMPLANT
SYR 30ML SLIP (SYRINGE) ×2 IMPLANT
TOWEL OR 17X24 6PK STRL BLUE (TOWEL DISPOSABLE) ×2 IMPLANT
TOWEL OR 17X26 10 PK STRL BLUE (TOWEL DISPOSABLE) ×2 IMPLANT
TRAY FOLEY CATH 16FR SILVER (SET/KITS/TRAYS/PACK) ×2 IMPLANT
WATER STERILE IRR 1000ML POUR (IV SOLUTION) ×2 IMPLANT

## 2014-09-12 NOTE — Progress Notes (Signed)
Utilization review completed.  

## 2014-09-12 NOTE — Transfer of Care (Signed)
Immediate Anesthesia Transfer of Care Note  Patient: Jeffrey Wheeler  Procedure(s) Performed: Procedure(s): RIGHT TOTAL KNEE ARTHROPLASTY (Right)  Patient Location: PACU  Anesthesia Type:General  Level of Consciousness: awake, alert , oriented and patient cooperative  Airway & Oxygen Therapy: Patient Spontanous Breathing and Patient connected to face mask oxygen  Post-op Assessment: Report given to PACU RN, Post -op Vital signs reviewed and stable and Patient moving all extremities  Post vital signs: Reviewed and stable  Complications: No apparent anesthesia complications

## 2014-09-12 NOTE — Anesthesia Postprocedure Evaluation (Signed)
  Anesthesia Post-op Note  Patient: Jeffrey Wheeler  Procedure(s) Performed: Procedure(s): RIGHT TOTAL KNEE ARTHROPLASTY (Right)  Patient Location: PACU  Anesthesia Type:General and GA combined with regional for post-op pain  Level of Consciousness: awake, alert  and oriented  Airway and Oxygen Therapy: Patient Spontanous Breathing and Patient connected to nasal cannula oxygen  Post-op Pain: none  Post-op Assessment: Post-op Vital signs reviewed, Patient's Cardiovascular Status Stable, Respiratory Function Stable and Patent Airway  Post-op Vital Signs: stable  Last Vitals:  Filed Vitals:   09/12/14 1500  BP: 150/92  Pulse: 67  Temp: 36.8 C  Resp: 17    Complications: No apparent anesthesia complications

## 2014-09-12 NOTE — Anesthesia Procedure Notes (Signed)
Anesthesia Regional Block:  Adductor canal block  Pre-Anesthetic Checklist: ,, timeout performed, Correct Patient, Correct Site, Correct Laterality, Correct Procedure, Correct Position, site marked, Risks and benefits discussed,  Surgical consent,  Pre-op evaluation,  At surgeon's request and post-op pain management  Laterality: Right  Prep: chloraprep       Needles:  Injection technique: Single-shot     Needle Length: 9cm 9 cm Needle Gauge: 22 and 22 G    Additional Needles:  Procedures: ultrasound guided (picture in chart) Adductor canal block Narrative:  Start time: 09/12/2014 10:05 AM End time: 09/12/2014 10:10 AM Injection made incrementally with aspirations every 5 mL.  Performed by: Personally   Additional Notes: 30 cc 0.5% Marcaine with 1:200 Epi injected easily

## 2014-09-12 NOTE — Interval H&P Note (Signed)
History and Physical Interval Note:  09/12/2014 10:27 AM  Jeffrey Wheeler  has presented today for surgery, with the diagnosis of PRIMARY LOCALIZED OA RIGHT KNEE  The various methods of treatment have been discussed with the patient and family. After consideration of risks, benefits and other options for treatment, the patient has consented to  Procedure(s): RIGHT TOTAL KNEE ARTHROPLASTY (Right) as a surgical intervention .  The patient's history has been reviewed, patient examined, no change in status, stable for surgery.  I have reviewed the patient's chart and labs.  Questions were answered to the patient's satisfaction.     Elsie Saas A

## 2014-09-12 NOTE — Op Note (Signed)
MRN:     [id number] DOB/AGE:    [birth year] / 78 y.o.       OPERATIVE REPORT    DATE OF PROCEDURE:  09/12/2014       PREOPERATIVE DIAGNOSIS:   PRIMARY LOCALIZED OA RIGHT KNEE      Estimated body mass index is 31.63 kg/(m^2) as calculated from the following:   Height as of this encounter: 5\' 8"  (1.727 m).   Weight as of this encounter: 94.348 kg (208 lb).                                                        POSTOPERATIVE DIAGNOSIS:   SAME                                                                     PROCEDURE:  Procedure(s): RIGHT TOTAL KNEE ARTHROPLASTY Using Depuy Sigma RP implants #4 Femur, #5Tibia, 12.6mm sigma RP bearing, 35 Patella     SURGEON: Alizon Schmeling A    ASSISTANT:  Kirstin Shepperson PA-C   (Present and scrubbed throughout the case, critical for assistance with exposure, retraction, instrumentation, and closure.)         ANESTHESIA: GET with Femoral Nerve Block  DRAINS: foley, 2 medium hemovac in knee   TOURNIQUET TIME: 40NUU   COMPLICATIONS:  None     SPECIMENS: None   INDICATIONS FOR PROCEDURE: The patient has  DJD RIGHT KNEE, varus deformities, XR shows bone on bone arthritis. Patient has failed all conservative measures including anti-inflammatory medicines, narcotics, attempts at  exercise and weight loss, cortisone injections and viscosupplementation.  Risks and benefits of surgery have been discussed, questions answered.   DESCRIPTION OF PROCEDURE: The patient identified by armband, received  right femoral nerve block and IV antibiotics, in the holding area at [facility]. Patient taken to the operating room, appropriate anesthetic  monitors were attached General endotracheal anesthesia induced with  the patient in supine position, Foley catheter was inserted. Tourniquet  applied high to the operative thigh. Lateral post and foot positioner  applied to the table, the lower extremity was then prepped and draped  in usual sterile fashion from  the ankle to the tourniquet. Time-out procedure was performed. The limb was wrapped with an Esmarch bandage and the tourniquet inflated to 365 mmHg. We began the operation by making the anterior midline incision starting at handbreadth above the patella going over the patella 1 cm medial to and  4 cm distal to the tibial tubercle. Small bleeders in the skin and the  subcutaneous tissue identified and cauterized. Transverse retinaculum was incised and reflected medially and a medial parapatellar arthrotomy was accomplished. the patella was everted and theprepatellar fat pad resected. The superficial medial collateral  ligament was then elevated from anterior to posterior along the proximal  flare of the tibia and anterior half of the menisci resected. The knee was hyperflexed exposing bone on bone arthritis. Peripheral and notch osteophytes as well as the cruciate ligaments were then resected. We continued to  work our way around posteriorly along the proximal tibia, and externally  rotated the tibia subluxing it out from underneath the femur. A McHale  retractor was placed through the notch and a lateral Hohmann retractor  placed, and we then drilled through the proximal tibia in line with the  axis of the tibia followed by an intramedullary guide rod and 2-degree  posterior slope cutting guide. The tibial cutting guide was pinned into place  allowing resection of 4 mm of bone medially and about 6 mm of bone  laterally because of her varus deformity. Satisfied with the tibial resection, we then  entered the distal femur 2 mm anterior to the PCL origin with the  intramedullary guide rod and applied the distal femoral cutting guide  set at 61mm, with 5 degrees of valgus. This was pinned along the  epicondylar axis. At this point, the distal femoral cut was accomplished without difficulty. We then sized for a #4 femoral component and pinned the guide in 3 degrees of external rotation.The chamfer cutting  guide was pinned into place. The anterior, posterior, and chamfer cuts were accomplished without difficulty followed by  the Sigma RP box cutting guide and the box cut. We also removed posterior osteophytes from the posterior femoral condyles. At this  time, the knee was brought into full extension. We checked our  extension and flexion gaps and found them symmetric at 12.53mm.  The patella thickness measured at 26 mm. We set the cutting guide at 17 and removed the posterior 9.5-10 mm  of the patella sized for 35 button and drilled the lollipop. The knee  was then once again hyperflexed exposing the proximal tibia. We sized for a #5 tibial base plate, applied the smokestack and the conical reamer followed by the the Delta fin keel punch. We then hammered into place the Sigma RP trial femoral component, inserted a 12.5-mm trial bearing, trial patellar button, and took the knee through range of motion from 0-130 degrees. No thumb pressure was required for patellar  tracking. At this point, all trial components were removed, a double batch of DePuy HV cement with  was mixed and applied to all bony metallic mating surfaces except for the posterior condyles of the femur itself. In order, we  hammered into place the tibial tray and removed excess cement, the femoral component and removed excess cement, a 12.5-mm Sigma RP bearing  was inserted, and the knee brought to full extension with compression.  The patellar button was clamped into place, and excess cement  removed. While the cement cured the wound was irrigated out with normal saline solution pulse lavage, and medium Hemovac drains were placed.. Ligament stability and patellar tracking were checked and found to be excellent. The tourniquet was then released and hemostasis was obtained with cautery. The parapatellar arthrotomy was closed with  #1 ethibond suture. The subcutaneous tissue with 0 and 2-0 undyed  Vicryl suture, and 4-0 Monocryl.. A dressing of  Xeroform,  4 x 4, dressing sponges, Webril, and Ace wrap applied. Needle and sponge count were correct times 2.The patient awakened, extubated, and taken to recovery room without difficulty. Vascular status was normal, pulses 2+ and symmetric.   Treyson Axel A 09/12/2014, 12:14 PM

## 2014-09-12 NOTE — Anesthesia Preprocedure Evaluation (Addendum)
Anesthesia Evaluation  Patient identified by MRN, date of birth, ID band Patient awake and Patient confused    Reviewed: Allergy & Precautions, H&P , NPO status , Patient's Chart, lab work & pertinent test results  Airway Mallampati: II  TM Distance: >3 FB Neck ROM: Full    Dental  (+) Teeth Intact, Dental Advisory Given   Pulmonary  breath sounds clear to auscultation        Cardiovascular Rhythm:Regular Rate:Normal     Neuro/Psych    GI/Hepatic   Endo/Other    Renal/GU      Musculoskeletal   Abdominal   Peds  Hematology   Anesthesia Other Findings   Reproductive/Obstetrics                             Anesthesia Physical Anesthesia Plan  ASA: II  Anesthesia Plan: MAC and Spinal   Post-op Pain Management:    Induction: Intravenous  Airway Management Planned: Natural Airway and Simple Face Mask  Additional Equipment:   Intra-op Plan:   Post-operative Plan:   Informed Consent: I have reviewed the patients History and Physical, chart, labs and discussed the procedure including the risks, benefits and alternatives for the proposed anesthesia with the patient or authorized representative who has indicated his/her understanding and acceptance.   Dental advisory given  Plan Discussed with: CRNA and Anesthesiologist  Anesthesia Plan Comments:         Anesthesia Quick Evaluation

## 2014-09-12 NOTE — Progress Notes (Signed)
Orthopedic Tech Progress Note Patient Details:  Jeffrey Wheeler 1936/05/06 [id number] CPM applied to RLE with appropriate settings. OHF applied to bed. Footsie roll provided.  CPM Right Knee CPM Right Knee: On Right Knee Flexion (Degrees): 90 Right Knee Extension (Degrees): 0   Asia R Thompson 09/12/2014, 1:38 PM

## 2014-09-13 ENCOUNTER — Inpatient Hospital Stay (HOSPITAL_COMMUNITY): Payer: Medicare HMO

## 2014-09-13 ENCOUNTER — Encounter (HOSPITAL_COMMUNITY): Payer: Self-pay | Admitting: Orthopedic Surgery

## 2014-09-13 DIAGNOSIS — R198 Other specified symptoms and signs involving the digestive system and abdomen: Secondary | ICD-10-CM | POA: Diagnosis present

## 2014-09-13 DIAGNOSIS — R1032 Left lower quadrant pain: Secondary | ICD-10-CM | POA: Diagnosis present

## 2014-09-13 LAB — CBC
HCT: 33.5 % — ABNORMAL LOW (ref 39.0–52.0)
Hemoglobin: 11.3 g/dL — ABNORMAL LOW (ref 13.0–17.0)
MCH: 29 pg (ref 26.0–34.0)
MCHC: 33.7 g/dL (ref 30.0–36.0)
MCV: 86.1 fL (ref 78.0–100.0)
Platelets: 160 10*3/uL (ref 150–400)
RBC: 3.89 MIL/uL — AB (ref 4.22–5.81)
RDW: 13.4 % (ref 11.5–15.5)
WBC: 8.7 10*3/uL (ref 4.0–10.5)

## 2014-09-13 LAB — COMPREHENSIVE METABOLIC PANEL
ALBUMIN: 2.8 g/dL — AB (ref 3.5–5.2)
ALT: 14 U/L (ref 0–53)
AST: 20 U/L (ref 0–37)
Alkaline Phosphatase: 62 U/L (ref 39–117)
Anion gap: 6 (ref 5–15)
BILIRUBIN TOTAL: 0.5 mg/dL (ref 0.3–1.2)
BUN: 11 mg/dL (ref 6–23)
CO2: 25 mmol/L (ref 19–32)
Calcium: 8.9 mg/dL (ref 8.4–10.5)
Chloride: 105 mEq/L (ref 96–112)
Creatinine, Ser: 0.81 mg/dL (ref 0.50–1.35)
GFR calc Af Amer: >90 mL/min (ref >90)
GFR, EST NON AFRICAN AMERICAN: 84 mL/min — AB (ref >90)
Glucose, Bld: 143 mg/dL — ABNORMAL HIGH (ref 70–99)
POTASSIUM: 4.1 mmol/L (ref 3.5–5.1)
SODIUM: 136 mmol/L (ref 135–145)
Total Protein: 5.2 g/dL — ABNORMAL LOW (ref 6.0–8.3)

## 2014-09-13 MED ORDER — POLYETHYLENE GLYCOL 3350 17 G PO PACK
17.0000 g | PACK | Freq: Two times a day (BID) | ORAL | Status: DC
  Administered 2014-09-13 – 2014-09-14 (×2): 17 g via ORAL
  Filled 2014-09-13 (×3): qty 1

## 2014-09-13 NOTE — Discharge Instructions (Signed)
Information on my medicine - XARELTO® (Rivaroxaban) ° °This medication education was reviewed with me or my healthcare representative as part of my discharge preparation.  The pharmacist that spoke with me during my hospital stay was:  Ethleen Lormand Stillinger, RPH ° °Why was Xarelto® prescribed for you? °Xarelto® was prescribed for you to reduce the risk of blood clots forming after orthopedic surgery. The medical term for these abnormal blood clots is venous thromboembolism (VTE). ° °What do you need to know about xarelto® ? °Take your Xarelto® ONCE DAILY at the same time every day. °You may take it either with or without food. ° °If you have difficulty swallowing the tablet whole, you may crush it and mix in applesauce just prior to taking your dose. ° °Take Xarelto® exactly as prescribed by your doctor and DO NOT stop taking Xarelto® without talking to the doctor who prescribed the medication.  Stopping without other VTE prevention medication to take the place of Xarelto® may increase your risk of developing a clot. ° °After discharge, you should have regular check-up appointments with your healthcare provider that is prescribing your Xarelto®.   ° °What do you do if you miss a dose? °If you miss a dose, take it as soon as you remember on the same day then continue your regularly scheduled once daily regimen the next day. Do not take two doses of Xarelto® on the same day.  ° °Important Safety Information °A possible side effect of Xarelto® is bleeding. You should call your healthcare provider right away if you experience any of the following: °? Bleeding from an injury or your nose that does not stop. °? Unusual colored urine (red or dark brown) or unusual colored stools (red or black). °? Unusual bruising for unknown reasons. °? A serious fall or if you hit your head (even if there is no bleeding). ° °Some medicines may interact with Xarelto® and might increase your risk of bleeding while on Xarelto®. To  help avoid this, consult your healthcare provider or pharmacist prior to using any new prescription or non-prescription medications, including herbals, vitamins, non-steroidal anti-inflammatory drugs (NSAIDs) and supplements. ° °This website has more information on Xarelto®: www.xarelto.com. ° ° ° °

## 2014-09-13 NOTE — Progress Notes (Signed)
Orthopedic Tech Progress Note Patient Details:  Jeffrey Wheeler 15-Jan-1936 [id number] On cpm at 7:30 pm RLE 0-60 Patient ID: Jeffrey Wheeler, male   DOB: 1936-02-02, 78 y.o.   MRN: [id number]   Jeffrey Wheeler 09/13/2014, 7:30 PM

## 2014-09-13 NOTE — Evaluation (Signed)
Physical Therapy Evaluation Patient Details Name: Jeffrey Wheeler MRN: [id number] DOB: 26-Jul-1936 Today's Date: 09/13/2014   History of Present Illness  Pt. was 78 y.o. male who underwent R TKA secondary to OA. Pt. PMH: arthroscopy, menisectomy of R knee, glaucoma, scoliosis, calf tumor  Clinical Impression  Pt is s/p TKA resulting in the deficits listed below (see PT Problem List). Pt tolerated OOB mobility well for first time. Pt will benefit from skilled PT to increase their independence and safety with mobility to allow discharge to the venue listed below.      Follow Up Recommendations Home health PT;Supervision/Assistance - 24 hour    Equipment Recommendations  None recommended by PT    Recommendations for Other Services       Precautions / Restrictions Precautions Precautions: Knee Restrictions Weight Bearing Restrictions: Yes RLE Weight Bearing: Weight bearing as tolerated      Mobility  Bed Mobility Overal bed mobility: Needs Assistance Bed Mobility: Supine to Sit     Supine to sit: Min guard     General bed mobility comments: pt used bed rail and HOB elevated  Transfers Overall transfer level: Needs assistance Equipment used: Rolling walker (2 wheeled) Transfers: Sit to/from Stand Sit to Stand: Min assist         General transfer comment: v/c's to push up from bed  Ambulation/Gait Ambulation/Gait assistance: Min assist Ambulation Distance (Feet): 60 Feet Assistive device: Rolling walker (2 wheeled) Gait Pattern/deviations: Step-to pattern;Decreased step length - right;Decreased stance time - right;Antalgic Gait velocity: slow   General Gait Details: v/c's for walker mangement and to increase R LE Wbing with knee extension and decreased bilat UE WBing  Stairs            Wheelchair Mobility    Modified Rankin (Stroke Patients Only)       Balance Overall balance assessment:  (needs RW for standing due to recent surgery)                                            Pertinent Vitals/Pain Pain Assessment: 0-10 Pain Score: 7  Pain Location: R knee Pain Intervention(s): Monitored during session    Home Living Family/patient expects to be discharged to:: Private residence Living Arrangements: Spouse/significant other Available Help at Discharge: Family;Available 24 hours/day Type of Home: House Home Access: Ramped entrance     Home Layout: One level Home Equipment: Walker - 2 wheels;Cane - single point;Grab bars - toilet;Grab bars - tub/shower;Hand held shower head      Prior Function Level of Independence: Independent               Hand Dominance   Dominant Hand: Right    Extremity/Trunk Assessment   Upper Extremity Assessment: Defer to OT evaluation           Lower Extremity Assessment: RLE deficits/detail RLE Deficits / Details: knee rom limited by pain, able to initiate quad set    Cervical / Trunk Assessment: Normal  Communication   Communication: No difficulties  Cognition Arousal/Alertness: Awake/alert Behavior During Therapy: WFL for tasks assessed/performed Overall Cognitive Status: Within Functional Limits for tasks assessed                      General Comments      Exercises Total Joint Exercises Ankle Circles/Pumps: AROM;Both;10 reps;Supine Quad Sets: AROM;Right;10 reps;Supine Knee Flexion: AROM;Right;10 reps;Supine  Goniometric ROM: 40 deg knee flexion      Assessment/Plan    PT Assessment Patient needs continued PT services  PT Diagnosis Difficulty walking;Abnormality of gait;Generalized weakness;Acute pain   PT Problem List Decreased strength;Decreased range of motion;Decreased activity tolerance;Decreased balance;Decreased mobility  PT Treatment Interventions DME instruction;Gait training;Functional mobility training;Therapeutic activities;Therapeutic exercise   PT Goals (Current goals can be found in the Care Plan section) Acute Rehab PT  Goals Patient Stated Goal: go home PT Goal Formulation: With patient Time For Goal Achievement: 09/20/14 Potential to Achieve Goals: Good    Frequency 7X/week   Barriers to discharge        Co-evaluation               End of Session Equipment Utilized During Treatment: Gait belt Activity Tolerance: Patient tolerated treatment well Patient left: in chair;with call bell/phone within reach;with family/visitor present Nurse Communication: Mobility status         Time: 6950-7225 PT Time Calculation (min) (ACUTE ONLY): 23 min   Charges:   PT Evaluation $Initial PT Evaluation Tier I: 1 Procedure PT Treatments $Gait Training: 8-22 mins   PT G CodesKingsley Callander 09/13/2014, 1:41 PM  Kittie Plater, PT, DPT Pager #: [phone] Office #: [phone]

## 2014-09-13 NOTE — Evaluation (Signed)
Occupational Therapy Evaluation Patient Details Name: Jeffrey Wheeler MRN: [id number] DOB: 07-12-1936 Today's Date: 09/13/2014    History of Present Illness Pt. was 78 y.o. male who underwent R TKA secondary to OA. Pt. PMH: arthroscopy, menisectomy of R knee, glaucoma, scoliosis, calf tumor   Clinical Impression   Pt. Was able to perform ADLs with A and will need further OT to maximize performance with use of AE. Pt. Will need further OT to maximize functional transfers. Pt. Is motivated to d/c home with wife who is an Therapist, sports. OT to follow.    Follow Up Recommendations  No OT follow up    Equipment Recommendations       Recommendations for Other Services       Precautions / Restrictions Precautions Precautions: Knee Restrictions Weight Bearing Restrictions: Yes RLE Weight Bearing: Weight bearing as tolerated      Mobility Bed Mobility Overal bed mobility: Needs Assistance Bed Mobility: Supine to Sit     Supine to sit: Min assist        Transfers Overall transfer level: Needs assistance Equipment used: Rolling walker (2 wheeled) Transfers: Stand Pivot Transfers   Stand pivot transfers: Min assist;Mod assist            Balance                                            ADL Overall ADL's : Needs assistance/impaired Eating/Feeding: Independent   Grooming: Wash/dry hands;Wash/dry face;Oral care;Brushing hair;Set up;Sitting   Upper Body Bathing: Set up;Sitting   Lower Body Bathing: Set up;Moderate assistance;Sit to/from stand   Upper Body Dressing : Set up;Sitting   Lower Body Dressing: Moderate assistance;Maximal assistance;Sit to/from stand   Toilet Transfer: Minimal assistance;Moderate assistance;Stand-pivot           Functional mobility during ADLs: Moderate assistance General ADL Comments: Pt. needs ed. on AE for LE ADLs     Vision                     Perception     Praxis      Pertinent Vitals/Pain Pain  Assessment: 0-10 Pain Score: 2  Pain Location:  (knee) Pain Descriptors / Indicators: Constant Pain Intervention(s): Monitored during session     Hand Dominance Right   Extremity/Trunk Assessment Upper Extremity Assessment Upper Extremity Assessment: Overall WFL for tasks assessed           Communication Communication Communication: No difficulties   Cognition Arousal/Alertness: Awake/alert Behavior During Therapy: WFL for tasks assessed/performed Overall Cognitive Status: Within Functional Limits for tasks assessed                     General Comments       Exercises       Shoulder Instructions      Home Living Family/patient expects to be discharged to:: Private residence Living Arrangements: Spouse/significant other Available Help at Discharge: Family;Available 24 hours/day Type of Home: House Home Access: Ramped entrance     Home Layout: One level     Bathroom Shower/Tub: Walk-in shower;Curtain   Bathroom Toilet: Handicapped height Bathroom Accessibility: Yes How Accessible: Accessible via walker Home Equipment: [place] - 2 wheels;Cane - single point;Grab bars - toilet;Grab bars - tub/shower;Hand held shower head          Prior Functioning/Environment Level of Independence: Independent  OT Diagnosis: Acute pain   OT Problem List: Decreased activity tolerance;Decreased knowledge of use of DME or AE   OT Treatment/Interventions: Self-care/ADL training;DME and/or AE instruction;Therapeutic activities    OT Goals(Current goals can be found in the care plan section) Acute Rehab OT Goals Patient Stated Goal: go home OT Goal Formulation: With patient Time For Goal Achievement: 09/26/15 Potential to Achieve Goals: Good ADL Goals Pt Will Perform Lower Body Bathing: with min assist;sit to/from stand;with adaptive equipment Pt Will Perform Lower Body Dressing: with min assist;with adaptive equipment;sit to/from stand Pt Will  Transfer to Toilet: with supervision;ambulating;regular height toilet;grab bars Pt Will Perform Toileting - Clothing Manipulation and hygiene: with min assist;sit to/from stand Pt Will Perform Tub/Shower Transfer: Shower transfer;grab bars;rolling walker;shower seat  OT Frequency: Min 2X/week   Barriers to D/C:            Co-evaluation              End of Session Equipment Utilized During Treatment: Gait belt;Rolling walker CPM Right Knee CPM Right Knee: On Right Knee Flexion (Degrees): 90 Right Knee Extension (Degrees): 0  Activity Tolerance: Patient tolerated treatment well Patient left: in chair;with call bell/phone within reach   Time: 0825-0906 OT Time Calculation (min): 41 min Charges:  OT General Charges $OT Visit: 1 Procedure OT Evaluation $Initial OT Evaluation Tier I: 1 Procedure OT Treatments $Self Care/Home Management : 23-37 mins G-Codes:    Irwin Toran 09/16/14, 9:05 AM

## 2014-09-13 NOTE — Progress Notes (Signed)
09/13/14 Spoke with patient about HHC, wife stated patient had been set up by MD office. Zola Button at MD office, patient set up with [facility]. Contacted Tim with [facility], he verified that [facility] would not be able to work with patient. Contacted several [facility] agencies in patient's area, soonest could set up [place] will be 09/19/13 with [facility]. Faxed HHPT order, demographics, H and P, op note and PT note to [place] at [phone].Received confirmation. Left message at office for Eliseo Gum PA about delay in start date for HHPT. Informed patient and his wife about change in [facility] agency. They are agreeable to the change in agency and start date.  [employer] providing CPM, patient already has rolling walker and 3N1. Will continue to follow. Fuller Plan RN, BSN, CCM

## 2014-09-13 NOTE — Progress Notes (Signed)
Physical therapy Treatment  Clinical impression: Pt progressing well. Pt with improved R knee flexion and ambulation tolerance this date. V/c's for safe walker management but suspect pt to be able to d/c home once medically stable.    09/13/14 1426  PT Visit Information  Last PT Received On 09/13/14  Assistance Needed +1  History of Present Illness Pt. was 78 y.o. male who underwent R TKA secondary to OA. Pt. PMH: arthroscopy, menisectomy of R knee, glaucoma, scoliosis, calf tumor  PT Time Calculation  PT Start Time (ACUTE ONLY) 1624  PT Stop Time (ACUTE ONLY) 1647  PT Time Calculation (min) (ACUTE ONLY) 23 min  Subjective Data  Subjective pt in CPM upon arrival  Precautions  Precautions Knee  Restrictions  Weight Bearing Restrictions Yes  RLE Weight Bearing WBAT  Pain Assessment  Pain Assessment Faces  Faces Pain Scale 4  Pain Intervention(s) Monitored during session  Cognition  Arousal/Alertness Awake/alert  Behavior During Therapy WFL for tasks assessed/performed  Overall Cognitive Status Within Functional Limits for tasks assessed  Bed Mobility  Overal bed mobility Modified Independent  Bed Mobility Supine to Sit;Sit to Supine  Supine to sit Min guard  Sit to supine Min guard  General bed mobility comments pt used trapeze, instructed on long sit and using elbows to assist trunk elevation  Transfers  Overall transfer level Needs assistance  Equipment used Rolling walker (2 wheeled)  Transfers Sit to/from Stand  Sit to Stand Min guard  General transfer comment v/c's to push up from bed  Ambulation/Gait  Ambulation/Gait assistance Min guard  Ambulation Distance (Feet) 100 Feet  Assistive device Rolling walker (2 wheeled)  Gait Pattern/deviations Step-to pattern  Gait velocity slow  General Gait Details v/c's to stay inside walker and complete step to gait pattern. pt with tendency to push walker far out in front and inc wbing on UEs  Exercises  Exercises Total Joint   Total Joint Exercises  Heel Slides AROM;Right;10 reps;Seated  Long Arc Quad AROM;Right;10 reps;Seated  PT - End of Session  Equipment Utilized During Treatment Gait belt  Activity Tolerance Patient tolerated treatment well  Patient left in bed;in CPM;with call bell/phone within reach;with family/visitor present  Nurse Communication Mobility status  PT - Assessment/Plan  PT Plan Current plan remains appropriate  PT Frequency (ACUTE ONLY) 7X/week  Follow Up Recommendations Home health PT;Supervision/Assistance - 24 hour  PT equipment None recommended by PT  PT Goal Progression  Progress towards PT goals Progressing toward goals  PT General Charges  $$ ACUTE PT VISIT 1 Procedure  PT Treatments  $Gait Training 8-22 mins  $Therapeutic Exercise 8-22 mins    Kittie Plater, PT, DPT Pager #: [phone] Office #: [phone]

## 2014-09-13 NOTE — Progress Notes (Addendum)
Subjective: Patients knee is doing well.  Continues to struggle with left sided abdominal pain.  Also has left flank swelling   Objective: Vital signs in last 24 hours: Temp:  [98 F (36.7 C)-98.4 F (36.9 C)] 98.4 F (36.9 C) (12/29 0615) Pulse Rate:  [64-92] 69 (12/29 0615) Resp:  [11-18] 18 (12/29 0615) BP: (123-188)/(59-106) 132/68 mmHg (12/29 0615) SpO2:  [93 %-100 %] 98 % (12/29 0615)  Intake/Output from previous day: 12/28 0701 - 12/29 0700 In: 4100 [P.O.:900; I.V.:3200] Out: 1975 [Urine:1350; Drains:575; Blood:50] Intake/Output this shift: Total I/O In: -  Out: 70 [Urine:70]   Recent Labs  09/13/14 0515  HGB 11.3*    Recent Labs  09/13/14 0515  WBC 8.7  RBC 3.89*  HCT 33.5*  PLT 160    Recent Labs  09/13/14 0515  NA 136  K 4.1  CL 105  CO2 25  BUN 11  CREATININE 0.81  GLUCOSE 143*  CALCIUM 8.9   No results for input(s): LABPT, INR in the last 72 hours.  ABD soft Neurovascular intact Sensation intact distally Intact pulses distally Incision: moderate drainage  Left sided abdominal tenderness and left flank swelling that is remarkable  Assessment/ Principal Problem:   Primary localized osteoarthritis of right knee Active Problems:   Glaucoma   Prostate disease   Gastroesophageal reflux disease   Back pain   Bilirubinuria   DJD (degenerative joint disease) of knee   Abdominal pain, left lower quadrant   Abdominal fullness in left flank   Plan: KUB and follow up Continue PT Plan on DC to home tomorrow   [place] J 09/13/2014, 10:15 AM

## 2014-09-14 LAB — URINALYSIS, ROUTINE W REFLEX MICROSCOPIC
Bilirubin Urine: NEGATIVE
Glucose, UA: NEGATIVE mg/dL
HGB URINE DIPSTICK: NEGATIVE
KETONES UR: NEGATIVE mg/dL
Leukocytes, UA: NEGATIVE
NITRITE: NEGATIVE
PH: 5.5 (ref 5.0–8.0)
PROTEIN: NEGATIVE mg/dL
Specific Gravity, Urine: 1.027 (ref 1.005–1.030)
Urobilinogen, UA: 0.2 mg/dL (ref 0.0–1.0)

## 2014-09-14 LAB — COMPREHENSIVE METABOLIC PANEL
ALT: 16 U/L (ref 0–53)
ANION GAP: 8 (ref 5–15)
AST: 27 U/L (ref 0–37)
Albumin: 2.9 g/dL — ABNORMAL LOW (ref 3.5–5.2)
Alkaline Phosphatase: 58 U/L (ref 39–117)
BILIRUBIN TOTAL: 0.3 mg/dL (ref 0.3–1.2)
BUN: 16 mg/dL (ref 6–23)
CALCIUM: 9.3 mg/dL (ref 8.4–10.5)
CO2: 25 mmol/L (ref 19–32)
CREATININE: 0.78 mg/dL (ref 0.50–1.35)
Chloride: 105 mEq/L (ref 96–112)
GFR calc Af Amer: >90 mL/min (ref >90)
GFR, EST NON AFRICAN AMERICAN: 85 mL/min — AB (ref >90)
Glucose, Bld: 137 mg/dL — ABNORMAL HIGH (ref 70–99)
Potassium: 4.3 mmol/L (ref 3.5–5.1)
Sodium: 138 mmol/L (ref 135–145)
Total Protein: 5.3 g/dL — ABNORMAL LOW (ref 6.0–8.3)

## 2014-09-14 LAB — CBC WITH DIFFERENTIAL/PLATELET
BASOS PCT: 0 % (ref 0–1)
Basophils Absolute: 0 10*3/uL (ref 0.0–0.1)
EOS PCT: 0 % (ref 0–5)
Eosinophils Absolute: 0 10*3/uL (ref 0.0–0.7)
HCT: 28.1 % — ABNORMAL LOW (ref 39.0–52.0)
Hemoglobin: 9.8 g/dL — ABNORMAL LOW (ref 13.0–17.0)
LYMPHS ABS: 0.9 10*3/uL (ref 0.7–4.0)
Lymphocytes Relative: 6 % — ABNORMAL LOW (ref 12–46)
MCH: 30.7 pg (ref 26.0–34.0)
MCHC: 34.9 g/dL (ref 30.0–36.0)
MCV: 88.1 fL (ref 78.0–100.0)
MONO ABS: 1.1 10*3/uL — AB (ref 0.1–1.0)
Monocytes Relative: 8 % (ref 3–12)
Neutro Abs: 12.9 10*3/uL — ABNORMAL HIGH (ref 1.7–7.7)
Neutrophils Relative %: 86 % — ABNORMAL HIGH (ref 43–77)
PLATELETS: 157 10*3/uL (ref 150–400)
RBC: 3.19 MIL/uL — ABNORMAL LOW (ref 4.22–5.81)
RDW: 13.7 % (ref 11.5–15.5)
WBC: 15 10*3/uL — ABNORMAL HIGH (ref 4.0–10.5)

## 2014-09-14 LAB — CBC
HCT: 29.4 % — ABNORMAL LOW (ref 39.0–52.0)
Hemoglobin: 10.1 g/dL — ABNORMAL LOW (ref 13.0–17.0)
MCH: 30.1 pg (ref 26.0–34.0)
MCHC: 34.4 g/dL (ref 30.0–36.0)
MCV: 87.8 fL (ref 78.0–100.0)
PLATELETS: 162 10*3/uL (ref 150–400)
RBC: 3.35 MIL/uL — AB (ref 4.22–5.81)
RDW: 13.6 % (ref 11.5–15.5)
WBC: 14.4 10*3/uL — ABNORMAL HIGH (ref 4.0–10.5)

## 2014-09-14 MED ORDER — POLYETHYLENE GLYCOL 3350 17 G PO PACK: PACK | ORAL | Status: DC

## 2014-09-14 MED ORDER — CELECOXIB 200 MG PO CAPS: 200.0000 mg | ORAL_CAPSULE | Freq: Every day | ORAL | Status: DC

## 2014-09-14 MED ORDER — BISACODYL 10 MG RE SUPP
10.0000 mg | Freq: Once | RECTAL | Status: AC
  Administered 2014-09-14: 10 mg via RECTAL
  Filled 2014-09-14: qty 1

## 2014-09-14 MED ORDER — RIVAROXABAN 10 MG PO TABS: 10.0000 mg | ORAL_TABLET | Freq: Every day | ORAL | Status: DC

## 2014-09-14 MED ORDER — OXYCODONE HCL 5 MG PO TABS: ORAL_TABLET | ORAL | Status: DC

## 2014-09-14 NOTE — Progress Notes (Signed)
Occupational Therapy Treatment Patient Details Name: Jeffrey Wheeler MRN: [id number] DOB: [birth year] Today's Date: 09/14/2014    History of present illness Pt. was 78 y.o. male who underwent R TKA secondary to OA. Pt. PMH: arthroscopy, menisectomy of R knee, glaucoma, scoliosis, calf tumor   OT comments  Pt making excellent progress. Completed all education regarding ADL, compensatory techniques and use of DME for ADL. OT signing off. Thanks  Follow Up Recommendations  No OT follow up;Supervision - Intermittent    Equipment Recommendations  None recommended by OT    Recommendations for Other Services      Precautions / Restrictions Precautions Precautions: Knee Restrictions RLE Weight Bearing: Weight bearing as tolerated       Mobility Bed Mobility Overal bed mobility: Modified Independent                Transfers Overall transfer level: Needs assistance Equipment used: Rolling walker (2 wheeled) Transfers: Sit to/from [employer] Transfers Sit to Stand: Supervision Stand pivot transfers: Supervision            Balance                                   ADL               Lower Body Bathing: Minimal assistance;Sit to/from stand       Lower Body Dressing: Minimal assistance;Sit to/from stand   Toilet Transfer: Supervision/safety;Ambulation;RW             General ADL Comments: Educated pt on transfer technique for walk in shower with use of 3 in 1. discussed use of 3 in 1 and encouraged pt to incorporate use of RLE in ADL tasks. Pt able to return demonstrate. Ambulated to bathroom and completed toilet transfer with S only.       Vision                     Perception     Praxis      Cognition   Behavior During Therapy: WFL for tasks assessed/performed Overall Cognitive Status: Within Functional Limits for tasks assessed                       Extremity/Trunk Assessment                Exercises     Shoulder Instructions       General Comments      Pertinent Vitals/ Pain       Pain Assessment: 0-10 Pain Score: 3  Pain Location: R knee Pain Descriptors / Indicators: Aching;Constant Pain Intervention(s): Limited activity within patient's tolerance;Monitored during session;Repositioned  Home Living                                          Prior Functioning/Environment              Frequency       Progress Toward Goals  OT Goals(current goals can now be found in the care plan section)  Progress towards OT goals: Goals met/education completed, patient discharged from OT  Acute Rehab OT Goals Patient Stated Goal: go home OT Goal Formulation: With patient Time For Goal Achievement: 09/26/15 Potential to Achieve Goals: Good ADL Goals Pt Will Perform Lower Body Bathing: with min  assist;sit to/from stand;with adaptive equipment Pt Will Perform Lower Body Dressing: with min assist;with adaptive equipment;sit to/from stand Pt Will Transfer to Toilet: with supervision;ambulating;regular height toilet;grab bars Pt Will Perform Toileting - Clothing Manipulation and hygiene: with min assist;sit to/from stand Pt Will Perform Tub/Shower Transfer: Shower transfer;grab bars;rolling walker;shower seat  Plan Discharge plan remains appropriate    Co-evaluation                 End of Session Equipment Utilized During Treatment: Gait belt;Rolling walker   Activity Tolerance Patient tolerated treatment well   Patient Left in bed;with call bell/phone within reach;with family/visitor present   Nurse Communication Mobility status        Time: 9774-1423 OT Time Calculation (min): 25 min  Charges: OT General Charges $OT Visit: 1 Procedure OT Treatments $Self Care/Home Management : 23-37 mins  Andrina Locken,HILLARY 09/14/2014, 12:49 PM   [facility], OTR/L  [phone] 09/14/2014

## 2014-09-14 NOTE — Discharge Summary (Signed)
Patient ID: Jeffrey Wheeler MRN: [id number] DOB/AGE: September 07, 1936 78 y.o.  Admit date: 09/12/2014 Discharge date: 09/14/2014  Admission Diagnoses:  Principal Problem:   Primary localized osteoarthritis of right knee Active Problems:   Glaucoma   Prostate disease   Gastroesophageal reflux disease   Back pain   Bilirubinuria   DJD (degenerative joint disease) of knee   Abdominal pain, left lower quadrant   Abdominal fullness in left flank   Discharge Diagnoses:  Same  Past Medical History  Diagnosis Date  . Back pain   . Gastroesophageal reflux disease   . Prostate disease   . Glaucoma   . Primary localized osteoarthritis of right knee   . Scoliosis     Surgeries: Procedure(s): RIGHT TOTAL KNEE ARTHROPLASTY on 09/12/2014   Consultants:    Discharged Condition: Improved  Hospital Course: Jeffrey Wheeler is an 78 y.o. male who was admitted 09/12/2014 for operative treatment ofPrimary localized osteoarthritis of right knee. Patient has severe unremitting pain that affects sleep, daily activities, and work/hobbies. After pre-op clearance the patient was taken to the operating room on 09/12/2014 and underwent  Procedure(s): RIGHT TOTAL KNEE ARTHROPLASTY.    Patient was given perioperative antibiotics: Anti-infectives    Start     Dose/Rate Route Frequency Ordered Stop   09/12/14 1645  ceFAZolin (ANCEF) IVPB 2 g/50 mL premix     2 g100 mL/hr over 30 Minutes Intravenous Every 6 hours 09/12/14 1640 09/12/14 2145   09/12/14 1620  ceFAZolin (ANCEF) 2-3 GM-% IVPB SOLR    Comments:  Reynolds, Lauren   : cabinet override      09/12/14 1620 09/13/14 0429   09/12/14 0600  ceFAZolin (ANCEF) IVPB 2 g/50 mL premix     2 g100 mL/hr over 30 Minutes Intravenous On call to O.R. 09/11/14 1412 09/12/14 1057       Patient was given sequential compression devices, early ambulation, and chemoprophylaxis to prevent DVT.  KUB and H Pylori screen was ordered for intermittant chronic abdominal  pain  Patient benefited maximally from hospital stay and there were no complications.    Recent vital signs: Patient Vitals for the past 24 hrs:  BP Temp Temp src Pulse Resp SpO2  09/14/14 0615 (!) 158/71 mmHg 98.1 F (36.7 C) Oral 89 18 97 %  09/13/14 2130 120/61 mmHg 98.2 F (36.8 C) Oral 87 18 95 %  09/13/14 1400 121/69 mmHg 98 F (36.7 C) - 89 16 99 %     Recent laboratory studies:  Recent Labs  09/13/14 0515 09/14/14 0632  WBC 8.7 14.4*  HGB 11.3* 10.1*  HCT 33.5* 29.4*  PLT 160 162  NA 136 138  K 4.1 4.3  CL 105 105  CO2 25 25  BUN 11 16  CREATININE 0.81 0.78  GLUCOSE 143* 137*  CALCIUM 8.9 9.3     Discharge Medications:     Medication List    STOP taking these medications        aspirin EC 81 MG tablet     etodolac 400 MG tablet  Commonly known as:  LODINE     HYDROcodone-acetaminophen 5-325 MG per tablet  Commonly known as:  NORCO/VICODIN      TAKE these medications        acetaminophen 325 MG tablet  Commonly known as:  TYLENOL  Take 650 mg by mouth every 6 (six) hours as needed.     celecoxib 200 MG capsule  Commonly known as:  CELEBREX  Take 1 capsule (200 mg  total) by mouth daily.     dorzolamide-timolol 22.3-6.8 MG/ML ophthalmic solution  Commonly known as:  COSOPT  Place 1 drop into both eyes at bedtime.     doxazosin 8 MG tablet  Commonly known as:  CARDURA  Take 8 mg by mouth daily.     finasteride 5 MG tablet  Commonly known as:  PROSCAR  Take 5 mg by mouth daily.     latanoprost 0.005 % ophthalmic solution  Commonly known as:  XALATAN  Place 1 drop into both eyes at bedtime.     mirabegron ER 25 MG Tb24 tablet  Commonly known as:  MYRBETRIQ  Take 25 mg by mouth daily.     oxyCODONE 5 MG immediate release tablet  Commonly known as:  Oxy IR/ROXICODONE  1-2 tablets every 4-6 hrs as needed for pain     pantoprazole 40 MG tablet  Commonly known as:  PROTONIX  Take 40 mg by mouth daily.     polyethylene glycol packet   Commonly known as:  MIRALAX / GLYCOLAX  Use twice a day until bowel movement  LAXITIVE.  Restart if two days since last bowel movement     rivaroxaban 10 MG Tabs tablet  Commonly known as:  XARELTO  Take 1 tablet (10 mg total) by mouth daily with breakfast.     senna 8.6 MG tablet  Commonly known as:  SENOKOT  Take 1 tablet by mouth daily as needed for constipation.        Diagnostic Studies: Dg Abd 1 View  09/13/2014   CLINICAL DATA:  Abdominal fullness in the left flank region. Generalized abdominal pain and nausea. Vomiting yesterday. Recent knee surgery.  EXAM: ABDOMEN - 1 VIEW  COMPARISON:  None.  FINDINGS: Bowel gas pattern is normal without evidence of ileus, obstruction or free air. Stool volume is normal. No worrisome calcifications. Multiple phleboliths in the pelvis. Curvature in degenerative change of the spine.  IMPRESSION: No acute or significant radiographic finding.   Electronically Signed   By: Nelson Chimes M.D.   On: 09/13/2014 11:05    Disposition: Final discharge disposition not confirmed      Discharge Instructions    CPM    Complete by:  As directed   Continuous passive motion machine (CPM):      Use the CPM from 0 to 90 for 6 hours per day.       You may break it up into 2 or 3 sessions per day.      Use CPM for 2 weeks or until you are told to stop.     Call MD / Call 911    Complete by:  As directed   If you experience chest pain or shortness of breath, CALL 911 and be transported to the hospital emergency room.  If you develope a fever above 101 F, pus (white drainage) or increased drainage or redness at the wound, or calf pain, call your surgeon's office.     Change dressing    Complete by:  As directed   DO NOT REMOVE BANDAGE OVER SURGICAL INCISION.  [place] WHOLE LEG INCLUDING OVER THE WATERPROOF BANDAGE WITH SOAP AND WATER EVERY DAY.     Constipation Prevention    Complete by:  As directed   Drink plenty of fluids.  Prune juice may be helpful.  You  may use a stool softener, such as Colace (over the counter) 100 mg twice a day.  Use MiraLax (over the counter) for constipation  as needed.     Diet - low sodium heart healthy    Complete by:  As directed      Discharge instructions    Complete by:  As directed   Total Knee Replacement Care After Refer to this sheet in the next few weeks. These discharge instructions provide you with general information on caring for yourself after you leave the hospital. Your caregiver may also give you specific instructions. Your treatment has been planned according to the most current medical practices available, but unavoidable complications sometimes occur. If you have any problems or questions after discharge, please call your caregiver. Regaining a near full range of motion of your knee within the first 3 to 6 weeks after surgery is critical. [place] may resume a normal diet and activities as directed.  Perform exercises as directed.  Place gray foam block, curve side up under heel at all times except when in CPM or when walking.  DO NOT modify, tear, cut, or change in any way the gray foam block. You will receive physical therapy daily  Take showers instead of baths until informed otherwise.  You may shower on when there is no drainage from the drain holes.  Please wash whole leg including aquacel dressing with soap and water  It is OK to take over-the-counter tylenol in addition to the oxycodone for pain, discomfort, or fever. Oxycodone is VERY constipating.  Please take stool softener twice a day and laxatives daily until bowels are regular Eat a well-balanced diet.  Avoid lifting or driving until you are instructed otherwise.  Make an appointment to see your caregiver for stitches (suture) or staple removal as directed.  If you have been sent home with a continuous passive motion machine (CPM machine), 0-90 degrees 6 hrs a day   2 hrs a shift SEEK MEDICAL CARE IF: You have swelling  of your calf or leg.  You develop shortness of breath or chest pain.  You have redness, swelling, or increasing pain in the wound.  There is pus or any unusual drainage coming from the surgical site.  You notice a bad smell coming from the surgical site or dressing.  The surgical site breaks open after sutures or staples have been removed.  There is persistent bleeding from the suture or staple line.  You are getting worse or are not improving.  You have any other questions or concerns.  SEEK IMMEDIATE MEDICAL CARE IF:  You have a fever.  You develop a rash.  You have difficulty breathing.  You develop any reaction or side effects to medicines given.  Your knee motion is decreasing rather than improving.  MAKE SURE YOU:  Understand these instructions.  Will watch your condition.  Will get help right away if you are not doing well or get worse.     Do not put a pillow under the knee. Place it under the heel.    Complete by:  As directed   Place gray foam block, curve side up under heel at all times except when in CPM or when walking.  DO NOT modify, tear, cut, or change in any way the gray foam block.     Increase activity slowly as tolerated    Complete by:  As directed      TED hose    Complete by:  As directed   Use stockings (TED hose) for 2 weeks on both leg(s).  You may remove them at night for sleeping.  Follow-up Information    Follow up with Lorn Junes, MD On 09/27/2014.   Specialty:  Orthopedic Surgery   Why:  appt time at the [facility] office at 3:20 pm   Contact information:   [address] [place] [address] [phone]        Signed: Linda Hedges 09/14/2014, 9:54 AM

## 2014-09-14 NOTE — Progress Notes (Addendum)
Physical Therapy Treatment Patient Details Name: Jeffrey Wheeler MRN: [id number] DOB: 04-23-1936 Today's Date: 09/14/2014    History of Present Illness Pt. was 78 y.o. male who underwent R TKA secondary to OA. Pt. PMH: arthroscopy, menisectomy of R knee, glaucoma, scoliosis, calf tumor    PT Comments    Pt mobilizing well today. Reviewed HEP handout with pt and wife. Pt safe from mobility standpoint to D/C home today. Reviewed car transfer technique with pt and wife.   Follow Up Recommendations  Home health PT;Supervision/Assistance - 24 hour     Equipment Recommendations  None recommended by PT    Recommendations for Other Services       Precautions / Restrictions Precautions Precautions: Knee Restrictions Weight Bearing Restrictions: Yes RLE Weight Bearing: Weight bearing as tolerated    Mobility  Bed Mobility Overal bed mobility: Modified Independent             General bed mobility comments: pt up in chair  Transfers Overall transfer level: Modified independent Equipment used: Rolling walker (2 wheeled) Transfers: Sit to/from Stand Sit to Stand: Supervision Stand pivot transfers: Supervision       General transfer comment: performed sit to stand from recliner and from lower surface toilet without difficulty; use of UEs to lower descent   Ambulation/Gait Ambulation/Gait assistance: Supervision Ambulation Distance (Feet): 150 Feet Assistive device: Rolling walker (2 wheeled) Gait Pattern/deviations: Step-to pattern;Step-through pattern;Decreased step length - left;Decreased stance time - right;Antalgic   Gait velocity interpretation: Below normal speed for age/gender General Gait Details: initially ambulating with antalgic gt; multimodal cues for step through gt and RW safety    Stairs            Wheelchair Mobility    Modified Rankin (Stroke Patients Only)       Balance Overall balance assessment: No apparent balance deficits (not formally  assessed)                                  Cognition Arousal/Alertness: Awake/alert Behavior During Therapy: WFL for tasks assessed/performed Overall Cognitive Status: Within Functional Limits for tasks assessed                      Exercises Total Joint Exercises Ankle Circles/Pumps: AROM;Both;10 reps;Seated Short Arc Quad: AROM;Strengthening;Right;10 reps;Seated;Other (comment) (hold 5 sec count) Heel Slides: AROM;Right;10 reps;Seated Hip ABduction/ADduction: Strengthening;AAROM;Right;10 reps;Seated Straight Leg Raises: AROM;Right;10 reps;Seated Long Arc Quad: AROM;Right;10 reps;Seated Goniometric ROM: 66 degrees of flexion    General Comments General comments (skin integrity, edema, etc.): reviewed HEP for TE with pt and wife       Pertinent Vitals/Pain Pain Assessment: 0-10 Pain Score: 4  Pain Location: Rt knee with movement  Pain Descriptors / Indicators: Aching;Sore Pain Intervention(s): Monitored during session;Premedicated before session;Repositioned;Ice applied    Home Living                      Prior Function            PT Goals (current goals can now be found in the care plan section) Acute Rehab PT Goals Patient Stated Goal: home today PT Goal Formulation: With patient Time For Goal Achievement: 09/20/14 Potential to Achieve Goals: Good Progress towards PT goals: Progressing toward goals    Frequency  7X/week    PT Plan Current plan remains appropriate    Co-evaluation  End of Session Equipment Utilized During Treatment: Gait belt Activity Tolerance: Patient tolerated treatment well Patient left: in chair;with call bell/phone within reach;with family/visitor present     Time: 2297-9892 PT Time Calculation (min) (ACUTE ONLY): 28 min  Charges:  $Gait Training: 8-22 mins $Therapeutic Exercise: 8-22 mins                    G CodesElie Confer [place], [place] 09/14/2014, 2:01 PM

## 2014-09-14 NOTE — Progress Notes (Signed)
CARE MANAGEMENT NOTE 09/14/2014  Patient:  Jeffrey Wheeler, Jeffrey Wheeler   Account Number:  [id number]  Date Initiated:  09/13/2014  Documentation initiated by:  [facility]  Subjective/Objective Assessment:   s/p rt TKA     Action/Plan:   PT/OT evals-recommended HHPT   Anticipated DC Date:  09/14/2014   Anticipated DC Plan:  [place]  CM consult      [facility] Choice  [place]   Choice offered to / List presented to:  C-1 Patient   DME arranged  CPM      DME agency  [employer]     [place] arranged  HH-2 PT      [place] agency  [facility]   Status of service:  Completed, signed off Medicare Important Message given?   (If response is "NO", the following Medicare IM given date fields will be blank) Date Medicare IM given:   Medicare IM given by:   Date Additional Medicare IM given:   Additional Medicare IM given by:    Discharge Disposition:  [place]  Per UR Regulation:  Reviewed for med. necessity/level of care/duration of stay  If discussed at [place] of Stay Meetings, dates discussed:    Comments:  09/14/14 Faxed Face to Face and d/c summary to [facility] at [place], received confirmation. Fuller Plan RN, BSN, CCM  09/13/14 Spoke with patient about HHC, wife stated patient had been set up by MD office. Zola Button at MD office, patient set up with [facility]. Contacted Tim with [facility], he verified that [facility] would not be able to work with patient. Contacted several [place] agencies in patient's area, soonest could set up [place] will be 09/19/13 with [facility]. Faxed HHPT order, demographics, H and P, op note and PT note to [place] at [phone].Received confirmation. Left message at office for Eliseo Gum PA about delay in start date for HHPT. Informed patient and his wife about change in [facility] agency. They are agreeable to the change in agency and start date.  [employer] providing CPM, patient already has rolling walker and 3N1. Will continue to follow. Fuller Plan RN, BSN, CCM

## 2014-09-15 LAB — H. PYLORI ANTIBODY, IGG: H Pylori IgG: <0.4 {ISR}

## 2014-12-07 DIAGNOSIS — H4011X2 Primary open-angle glaucoma, moderate stage: Secondary | ICD-10-CM | POA: Diagnosis not present

## 2015-01-20 DIAGNOSIS — M47896 Other spondylosis, lumbar region: Secondary | ICD-10-CM | POA: Diagnosis not present

## 2015-01-20 DIAGNOSIS — M4806 Spinal stenosis, lumbar region: Secondary | ICD-10-CM | POA: Diagnosis not present

## 2015-01-20 DIAGNOSIS — M545 Low back pain: Secondary | ICD-10-CM | POA: Diagnosis not present

## 2015-04-03 DIAGNOSIS — M1990 Unspecified osteoarthritis, unspecified site: Secondary | ICD-10-CM | POA: Diagnosis not present

## 2015-04-03 DIAGNOSIS — Z125 Encounter for screening for malignant neoplasm of prostate: Secondary | ICD-10-CM | POA: Diagnosis not present

## 2015-04-03 DIAGNOSIS — Z79899 Other long term (current) drug therapy: Secondary | ICD-10-CM | POA: Diagnosis not present

## 2015-04-03 DIAGNOSIS — K219 Gastro-esophageal reflux disease without esophagitis: Secondary | ICD-10-CM | POA: Diagnosis not present

## 2015-04-03 DIAGNOSIS — E785 Hyperlipidemia, unspecified: Secondary | ICD-10-CM | POA: Diagnosis not present

## 2015-04-14 DIAGNOSIS — Z23 Encounter for immunization: Secondary | ICD-10-CM | POA: Diagnosis not present

## 2015-04-14 DIAGNOSIS — R001 Bradycardia, unspecified: Secondary | ICD-10-CM | POA: Diagnosis not present

## 2015-04-14 DIAGNOSIS — Z6833 Body mass index (BMI) 33.0-33.9, adult: Secondary | ICD-10-CM | POA: Diagnosis not present

## 2015-04-14 DIAGNOSIS — M1990 Unspecified osteoarthritis, unspecified site: Secondary | ICD-10-CM | POA: Diagnosis not present

## 2015-04-14 DIAGNOSIS — N400 Enlarged prostate without lower urinary tract symptoms: Secondary | ICD-10-CM | POA: Diagnosis not present

## 2015-05-01 DIAGNOSIS — M5136 Other intervertebral disc degeneration, lumbar region: Secondary | ICD-10-CM | POA: Diagnosis not present

## 2015-05-08 DIAGNOSIS — H1089 Other conjunctivitis: Secondary | ICD-10-CM | POA: Diagnosis not present

## 2015-05-08 DIAGNOSIS — H4011X2 Primary open-angle glaucoma, moderate stage: Secondary | ICD-10-CM | POA: Diagnosis not present

## 2015-07-21 DIAGNOSIS — L570 Actinic keratosis: Secondary | ICD-10-CM | POA: Diagnosis not present

## 2016-04-04 ENCOUNTER — Other Ambulatory Visit (HOSPITAL_COMMUNITY): Payer: Self-pay | Admitting: Internal Medicine

## 2016-04-04 DIAGNOSIS — J329 Chronic sinusitis, unspecified: Secondary | ICD-10-CM

## 2016-04-11 ENCOUNTER — Ambulatory Visit (HOSPITAL_COMMUNITY)
Admission: RE | Admit: 2016-04-11 | Discharge: 2016-04-11 | Disposition: A | Discharge status: Home / Self Care | Payer: Medicare Other | Source: Ambulatory Visit | Attending: Internal Medicine | Admitting: Internal Medicine

## 2016-04-11 DIAGNOSIS — J320 Chronic maxillary sinusitis: Secondary | ICD-10-CM | POA: Insufficient documentation

## 2016-04-11 DIAGNOSIS — J329 Chronic sinusitis, unspecified: Secondary | ICD-10-CM | POA: Diagnosis present

## 2016-04-11 DIAGNOSIS — R609 Edema, unspecified: Secondary | ICD-10-CM | POA: Insufficient documentation

## 2016-04-11 DIAGNOSIS — J3489 Other specified disorders of nose and nasal sinuses: Secondary | ICD-10-CM | POA: Insufficient documentation

## 2016-04-11 DIAGNOSIS — J342 Deviated nasal septum: Secondary | ICD-10-CM | POA: Insufficient documentation

## 2019-02-05 ENCOUNTER — Other Ambulatory Visit: Payer: Self-pay | Admitting: Neurological Surgery

## 2019-02-05 DIAGNOSIS — M48062 Spinal stenosis, lumbar region with neurogenic claudication: Secondary | ICD-10-CM

## 2019-02-06 ENCOUNTER — Other Ambulatory Visit: Payer: Self-pay

## 2019-02-06 ENCOUNTER — Ambulatory Visit
Admission: RE | Admit: 2019-02-06 | Discharge: 2019-02-06 | Disposition: A | Discharge status: Home / Self Care | Payer: Medicare HMO | Source: Ambulatory Visit | Attending: Neurological Surgery | Admitting: Neurological Surgery

## 2019-02-06 DIAGNOSIS — M48062 Spinal stenosis, lumbar region with neurogenic claudication: Secondary | ICD-10-CM

## 2019-11-23 DIAGNOSIS — R350 Frequency of micturition: Secondary | ICD-10-CM | POA: Diagnosis not present

## 2019-11-23 DIAGNOSIS — N401 Enlarged prostate with lower urinary tract symptoms: Secondary | ICD-10-CM | POA: Diagnosis not present

## 2019-11-23 DIAGNOSIS — H6123 Impacted cerumen, bilateral: Secondary | ICD-10-CM | POA: Diagnosis not present

## 2019-12-06 DIAGNOSIS — L570 Actinic keratosis: Secondary | ICD-10-CM | POA: Diagnosis not present

## 2020-01-11 DIAGNOSIS — H401132 Primary open-angle glaucoma, bilateral, moderate stage: Secondary | ICD-10-CM | POA: Diagnosis not present

## 2020-01-13 DIAGNOSIS — Z6830 Body mass index (BMI) 30.0-30.9, adult: Secondary | ICD-10-CM | POA: Diagnosis not present

## 2020-01-13 DIAGNOSIS — M4156 Other secondary scoliosis, lumbar region: Secondary | ICD-10-CM | POA: Diagnosis not present

## 2020-01-13 DIAGNOSIS — R030 Elevated blood-pressure reading, without diagnosis of hypertension: Secondary | ICD-10-CM | POA: Diagnosis not present

## 2020-01-19 ENCOUNTER — Other Ambulatory Visit: Payer: Self-pay | Admitting: Neurological Surgery

## 2020-01-19 DIAGNOSIS — M4156 Other secondary scoliosis, lumbar region: Secondary | ICD-10-CM

## 2020-01-19 DIAGNOSIS — M4186 Other forms of scoliosis, lumbar region: Secondary | ICD-10-CM

## 2020-02-08 DIAGNOSIS — H6123 Impacted cerumen, bilateral: Secondary | ICD-10-CM | POA: Diagnosis not present

## 2020-02-10 DIAGNOSIS — B0052 Herpesviral keratitis: Secondary | ICD-10-CM | POA: Diagnosis not present

## 2020-02-23 ENCOUNTER — Other Ambulatory Visit: Payer: Medicare HMO

## 2020-02-24 ENCOUNTER — Other Ambulatory Visit: Payer: Medicare PPO

## 2020-02-24 ENCOUNTER — Other Ambulatory Visit: Payer: Self-pay

## 2020-02-24 ENCOUNTER — Ambulatory Visit
Admission: RE | Admit: 2020-02-24 | Discharge: 2020-02-24 | Disposition: A | Discharge status: Home / Self Care | Payer: Medicare PPO | Source: Ambulatory Visit | Attending: Neurological Surgery | Admitting: Neurological Surgery

## 2020-02-24 DIAGNOSIS — M48061 Spinal stenosis, lumbar region without neurogenic claudication: Secondary | ICD-10-CM | POA: Diagnosis not present

## 2020-02-24 DIAGNOSIS — M4186 Other forms of scoliosis, lumbar region: Secondary | ICD-10-CM

## 2020-02-24 DIAGNOSIS — M4156 Other secondary scoliosis, lumbar region: Secondary | ICD-10-CM

## 2020-02-25 DIAGNOSIS — M48062 Spinal stenosis, lumbar region with neurogenic claudication: Secondary | ICD-10-CM | POA: Diagnosis not present

## 2020-04-14 DIAGNOSIS — K219 Gastro-esophageal reflux disease without esophagitis: Secondary | ICD-10-CM | POA: Diagnosis not present

## 2020-04-14 DIAGNOSIS — R32 Unspecified urinary incontinence: Secondary | ICD-10-CM | POA: Diagnosis not present

## 2020-04-14 DIAGNOSIS — G8929 Other chronic pain: Secondary | ICD-10-CM | POA: Diagnosis not present

## 2020-04-14 DIAGNOSIS — N529 Male erectile dysfunction, unspecified: Secondary | ICD-10-CM | POA: Diagnosis not present

## 2020-04-14 DIAGNOSIS — G3184 Mild cognitive impairment, so stated: Secondary | ICD-10-CM | POA: Diagnosis not present

## 2020-04-14 DIAGNOSIS — H04129 Dry eye syndrome of unspecified lacrimal gland: Secondary | ICD-10-CM | POA: Diagnosis not present

## 2020-04-14 DIAGNOSIS — H409 Unspecified glaucoma: Secondary | ICD-10-CM | POA: Diagnosis not present

## 2020-04-14 DIAGNOSIS — Z9181 History of falling: Secondary | ICD-10-CM | POA: Diagnosis not present

## 2020-04-14 DIAGNOSIS — I10 Essential (primary) hypertension: Secondary | ICD-10-CM | POA: Diagnosis not present

## 2020-05-08 DIAGNOSIS — M79671 Pain in right foot: Secondary | ICD-10-CM | POA: Diagnosis not present

## 2020-05-12 DIAGNOSIS — H6123 Impacted cerumen, bilateral: Secondary | ICD-10-CM | POA: Diagnosis not present

## 2020-05-16 DIAGNOSIS — D485 Neoplasm of uncertain behavior of skin: Secondary | ICD-10-CM | POA: Diagnosis not present

## 2020-05-16 DIAGNOSIS — L570 Actinic keratosis: Secondary | ICD-10-CM | POA: Diagnosis not present

## 2020-05-16 DIAGNOSIS — L988 Other specified disorders of the skin and subcutaneous tissue: Secondary | ICD-10-CM | POA: Diagnosis not present

## 2020-05-17 DIAGNOSIS — M5410 Radiculopathy, site unspecified: Secondary | ICD-10-CM | POA: Diagnosis not present

## 2020-05-18 ENCOUNTER — Other Ambulatory Visit: Payer: Self-pay | Admitting: Internal Medicine

## 2020-05-18 ENCOUNTER — Other Ambulatory Visit (HOSPITAL_COMMUNITY): Payer: Self-pay | Admitting: Internal Medicine

## 2020-05-18 ENCOUNTER — Ambulatory Visit (HOSPITAL_COMMUNITY)
Admission: RE | Admit: 2020-05-18 | Discharge: 2020-05-18 | Disposition: A | Discharge status: Home / Self Care | Payer: Medicare PPO | Source: Ambulatory Visit | Attending: Internal Medicine | Admitting: Internal Medicine

## 2020-05-18 ENCOUNTER — Other Ambulatory Visit: Payer: Self-pay

## 2020-05-18 DIAGNOSIS — R519 Headache, unspecified: Secondary | ICD-10-CM | POA: Diagnosis not present

## 2020-05-31 DIAGNOSIS — M5481 Occipital neuralgia: Secondary | ICD-10-CM | POA: Diagnosis not present

## 2020-06-19 ENCOUNTER — Encounter: Payer: Self-pay | Admitting: Neurology

## 2020-06-27 DIAGNOSIS — Z125 Encounter for screening for malignant neoplasm of prostate: Secondary | ICD-10-CM | POA: Diagnosis not present

## 2020-06-27 DIAGNOSIS — E785 Hyperlipidemia, unspecified: Secondary | ICD-10-CM | POA: Diagnosis not present

## 2020-06-27 DIAGNOSIS — Z79899 Other long term (current) drug therapy: Secondary | ICD-10-CM | POA: Diagnosis not present

## 2020-06-27 DIAGNOSIS — K219 Gastro-esophageal reflux disease without esophagitis: Secondary | ICD-10-CM | POA: Diagnosis not present

## 2020-06-27 DIAGNOSIS — M1990 Unspecified osteoarthritis, unspecified site: Secondary | ICD-10-CM | POA: Diagnosis not present

## 2020-06-27 DIAGNOSIS — R7301 Impaired fasting glucose: Secondary | ICD-10-CM | POA: Diagnosis not present

## 2020-07-04 DIAGNOSIS — R7301 Impaired fasting glucose: Secondary | ICD-10-CM | POA: Diagnosis not present

## 2020-07-04 DIAGNOSIS — M5481 Occipital neuralgia: Secondary | ICD-10-CM | POA: Diagnosis not present

## 2020-07-04 DIAGNOSIS — Z0001 Encounter for general adult medical examination with abnormal findings: Secondary | ICD-10-CM | POA: Diagnosis not present

## 2020-07-04 DIAGNOSIS — Z0000 Encounter for general adult medical examination without abnormal findings: Secondary | ICD-10-CM | POA: Diagnosis not present

## 2020-07-04 DIAGNOSIS — M48061 Spinal stenosis, lumbar region without neurogenic claudication: Secondary | ICD-10-CM | POA: Diagnosis not present

## 2020-07-04 DIAGNOSIS — N400 Enlarged prostate without lower urinary tract symptoms: Secondary | ICD-10-CM | POA: Diagnosis not present

## 2020-07-11 DIAGNOSIS — H401132 Primary open-angle glaucoma, bilateral, moderate stage: Secondary | ICD-10-CM | POA: Diagnosis not present

## 2020-07-26 ENCOUNTER — Ambulatory Visit: Payer: Medicare PPO | Admitting: Neurology

## 2020-08-02 ENCOUNTER — Encounter: Payer: Self-pay | Admitting: *Deleted

## 2020-08-04 ENCOUNTER — Encounter: Payer: Self-pay | Admitting: Neurology

## 2020-08-04 ENCOUNTER — Ambulatory Visit: Payer: Medicare PPO | Admitting: Neurology

## 2020-08-04 VITALS — BP 101/59 | HR 61 | Ht 69.0 in | Wt 212.0 lb

## 2020-08-04 DIAGNOSIS — R4189 Other symptoms and signs involving cognitive functions and awareness: Secondary | ICD-10-CM

## 2020-08-04 DIAGNOSIS — N3941 Urge incontinence: Secondary | ICD-10-CM

## 2020-08-04 DIAGNOSIS — M48061 Spinal stenosis, lumbar region without neurogenic claudication: Secondary | ICD-10-CM

## 2020-08-04 DIAGNOSIS — G629 Polyneuropathy, unspecified: Secondary | ICD-10-CM

## 2020-08-04 DIAGNOSIS — G912 (Idiopathic) normal pressure hydrocephalus: Secondary | ICD-10-CM | POA: Diagnosis not present

## 2020-08-04 DIAGNOSIS — R269 Unspecified abnormalities of gait and mobility: Secondary | ICD-10-CM

## 2020-08-04 NOTE — Progress Notes (Signed)
[facility]  PATIENT: Jeffrey Wheeler DOB: October 30, 1935  REFERRING DOCTOR OR PCP:   Asencion Noble, MD SOURCE: Patient, notes from primary care, imaging and lab reports, images personally reviewed.  _________________________________   HISTORICAL  CHIEF COMPLAINT:  Chief Complaint  Patient presents with   New Patient (Initial Visit)    RM 68, with wife. Trouble with gait. Worsening.    HISTORY OF PRESENT ILLNESS:  I had the pleasure seeing your patient, Jeffrey Wheeler, at [facility] for a neurologic consultation regarding his progressive gait difficulties.  Jeffrey Wheeler is an 84 year old man with progressive gait difficulty.   He has noted more trouble with gait and balance.    He does not feel lightheaded when he stands.   However, he has noted difficulty with his first step and takes a while to walk.   On a good day, he can walk 100 feet without stopping and longer if he takes breaks with sitting.   He has trouble picking up his feet and his stride is very reduced.    He has stumbled some but no falls.  He has urinary frequency,  urgency and occasional incontinence.  He feels that this has progressed over the last year.   The urolift procedure did not help much-maybe a [place] bit of course but not now.     His wife notes he has mild cognitive skills.  Memory is worse.  He has apathy.   He takes a longer time to process.      He has LBP that does not radiate.   LBP is worse with standing.  It also can be worse when he walks.  Images and reports personally reviewed:  CT scan of the head 05/18/2020: There is ventriculomegaly out of proportion to the extent of atrophy, potentially worrisome for normal pressure hydrocephalus.  Additionally, there are extensive hypodense foci in the white matter consistent with moderate chronic microvascular ischemic change.  No acute findings.  MRI of the lumbar spine from 02/24/2020: There are severe degenerative changes at  every level and also scoliosis.  There is edema with in the endplates at Y6-A6.  Compared to the MRI from 02/06/2019 the current MRI is essentially unchanged.  There is foraminal narrowing with potential for exiting nerve root compression to the right at L1-L2, bilaterally at L3-L4.  At L3-L4, there closely compression of the traversing L4 nerve root and at L4-L5 there can be compression of the traversing left L5 nerve root.  On the sagittal views, there also appears to be severe foraminal narrowing at T10-T11 to the left that could affect the T10 nerve root  REVIEW OF SYSTEMS: Constitutional: No fevers, chills, sweats, or change in appetite Eyes: No visual changes, double vision, eye pain Ear, nose and throat: No hearing loss, ear pain, nasal congestion, sore throat Cardiovascular: No chest pain, palpitations Respiratory: No shortness of breath at rest or with exertion.   No wheezes GastrointestinaI: No nausea, vomiting, diarrhea, abdominal pain, fecal incontinence Genitourinary: No dysuria, urinary retention or frequency.  No nocturia. Musculoskeletal: No neck pain, back pain Integumentary: No rash, pruritus, skin lesions Neurological: as above Psychiatric: No depression at this time.  No anxiety Endocrine: No palpitations, diaphoresis, change in appetite, change in weigh or increased thirst Hematologic/Lymphatic: No anemia, purpura, petechiae. Allergic/Immunologic: No itchy/runny eyes, nasal congestion, recent allergic reactions, rashes  ALLERGIES: No Known Allergies  HOME MEDICATIONS:  Current Outpatient Medications:    acetaminophen (TYLENOL) 325 MG tablet, Take 650 mg by  mouth every 6 (six) hours as needed., Disp: , Rfl:    ACYCLOVIR PO, Take by mouth., Disp: , Rfl:    dorzolamide-timolol (COSOPT) 22.3-6.8 MG/ML ophthalmic solution, Place 1 drop into both eyes at bedtime. , Disp: , Rfl: 12   doxazosin (CARDURA) 8 MG tablet, Take 8 mg by mouth daily., Disp: , Rfl:     gabapentin (NEURONTIN) 100 MG capsule, Take 100 mg by mouth 2 (two) times daily., Disp: , Rfl:    latanoprost (XALATAN) 0.005 % ophthalmic solution, Place 1 drop into both eyes at bedtime., Disp: , Rfl:    mirabegron ER (MYRBETRIQ) 25 MG TB24 tablet, Take 25 mg by mouth daily., Disp: , Rfl:    pantoprazole (PROTONIX) 40 MG tablet, Take 40 mg by mouth daily., Disp: , Rfl:    polyethylene glycol (MIRALAX / GLYCOLAX) packet, Use twice a day until bowel movement  LAXITIVE.  Restart if two days since last bowel movement, Disp: 60 each, Rfl: 0   senna (SENOKOT) 8.6 MG tablet, Take 1 tablet by mouth daily as needed for constipation. , Disp: , Rfl:    traMADol (ULTRAM) 50 MG tablet, Take 50 mg by mouth., Disp: , Rfl:   PAST MEDICAL HISTORY: Past Medical History:  Diagnosis Date   Back pain    BPH (benign prostatic hyperplasia)    Coxsackie viruses    age 84   Gastroesophageal reflux disease    Generalized hyperhidrosis    GERD (gastroesophageal reflux disease)    Glaucoma    High cholesterol    Osteoarthritis    Primary localized osteoarthritis of right knee    Prostate disease    Scoliosis     PAST SURGICAL HISTORY: Past Surgical History:  Procedure Laterality Date   APPENDECTOMY  1969   EYE SURGERY     laser surgery for glaucoma   KNEE ARTHROSCOPY W/ MENISCECTOMY Right    TONSILLECTOMY     TOTAL KNEE ARTHROPLASTY Right 09/12/2014   Procedure: RIGHT TOTAL KNEE ARTHROPLASTY;  Surgeon: Nilda Simmer, MD;  Location: [facility] OR;  Service: Orthopedics;  Laterality: Right;   TUMOR EXCISION Right 40 yrs ago   calf    FAMILY HISTORY: Family History  Problem Relation Age of Onset   Diabetes Other    CAD Other    Hypertension Other    CVA Other    Multiple myeloma Mother    Cirrhosis Father     SOCIAL HISTORY:  Social History   Socioeconomic History   Marital status: Married    Spouse name: Not on file   Number of children: Not on file   Years  of education: Not on file   Highest education level: Not on file  Occupational History   Not on file  Tobacco Use   Smoking status: Former Smoker   Smokeless tobacco: Former Neurosurgeon    Quit date: 08/31/2000  Substance and Sexual Activity   Alcohol use: Yes    Alcohol/week: 3.0 standard drinks    Types: 3 Shots of liquor per week   Drug use: No   Sexual activity: Not on file  Other Topics Concern   Not on file  Social History Narrative   Not on file   Social Determinants of Health   Financial Resource Strain:    Difficulty of Paying Living Expenses: Not on file  Food Insecurity:    Worried About Running Out of Food in the Last Year: Not on file   Ran Out of Food in the Last  Year: Not on file  Transportation Needs:    Lack of Transportation (Medical): Not on file   Lack of Transportation (Non-Medical): Not on file  Physical Activity:    Days of Exercise per Week: Not on file   Minutes of Exercise per Session: Not on file  Stress:    Feeling of Stress : Not on file  Social Connections:    Frequency of Communication with Friends and Family: Not on file   Frequency of Social Gatherings with Friends and Family: Not on file   Attends Religious Services: Not on file   Active Member of Clubs or Organizations: Not on file   Attends Archivist Meetings: Not on file   Marital Status: Not on file  Intimate Partner Violence:    Fear of Current or Ex-Partner: Not on file   Emotionally Abused: Not on file   Physically Abused: Not on file   Sexually Abused: Not on file     PHYSICAL EXAM  Vitals:   08/04/20 0944  BP: (!) 101/59  Pulse: 61  Weight: 212 lb (96.2 kg)  Height: $Remove'5\' 9"'BAXAMGu$  (1.753 m)    Body mass index is 31.31 kg/m.   General: The patient is well-developed and well-nourished and in no acute distress  HEENT:  Head is [place]/AT.  Sclera are anicteric.    Neck: No carotid bruits are noted.  The neck is nontender.  Cardiovascular: The  heart has a regular rate and rhythm with a normal S1 and S2. There were no murmurs, gallops or rubs.    Skin/Extremities: Extremities are without rash.  Miimal ankle edema.  Musculoskeletal:  Back is non-tender with reduced ROM  Neurologic Exam  Mental status: The patient is alert and oriented x 3 at the time of the examination. The patient has apparent normal recent and remote memory, with an apparently normal attention span and concentration ability.   Speech is normal.  Cranial nerves: Extraocular movements are full except 50% upgaze. . There is good facial sensation to soft touch bilaterally.Facial strength is normal.  Trapezius and sternocleidomastoid strength is normal. No dysarthria is noted.  The tongue is midline, and the patient has symmetric elevation of the soft palate. No obvious hearing deficits are noted.  Motor:  Muscle bulk is normal.   Tone is normal. Strength is  5 / 5 in all 4 extremities.   Sensory: Sensory testing is intact to pinprick, soft touch and vibration sensation in arms but reduced vibration sensation in .  Coordination: Cerebellar testing reveals good finger-nose-finger and heel-to-shin bilaterally.  Gait and station: Station is wide.   Gait is shuffling with small stride and wide base.   He cannot tandem walk.   Turns 180 degrees in 9 steps.  Has retropulsion.  l. Romberg is negative.   Reflexes: Deep tendon reflexes are symmetric and normal bilaterally.   Plantar responses are flexor.      ASSESSMENT AND PLAN  Normal pressure hydrocephalus (HCC) - Plan: DG FL GUIDED LUMBAR PUNCTURE  Polyneuropathy - Plan: Multiple Myeloma Panel (SPEP&IFE w/QIG), Vitamin B12, Sjogren's syndrome antibods(ssa + ssb), Rheumatoid factor  Gait disturbance - Plan: DG FL GUIDED LUMBAR PUNCTURE  Urge incontinence  Cognitive change - Plan: Multiple Myeloma Panel (SPEP&IFE w/QIG), Vitamin B12  Degenerative lumbar spinal stenosis - Plan: Rheumatoid factor   In summary,  Jeffrey Wheeler is an 84 year old man who has had progressive gait disturbance, urinary dysfunction and cognitive change over the last year or longer.  On exam, he has  a magnetic gait.  Additionally, he had numbness at the ankles, more severe at the toes consistent with a polyneuropathy.  There was mild weakness at the toes.  CT scan is potentially worrisome for normal pressure hydrocephalus showing ventriculomegaly that is out of proportion to the extent of generalized cortical atrophy.  I feel his gait disturbance is multifactorial.  I am most concerned that normal pressure hydrocephalus is playing the dominant role in the dysfunction.  He appears to have a polyneuropathy which likely plays some role but he did not have a positive Romberg sign and balance was just slightly affected.  His arthritic changes and chronic microvascular ischemic change in the brain are probably also contributing to the gait disturbance.  Normal pressure hydrocephalus could also explain the urinary dysfunction and the subtle cognitive changes over the last year.  Therefore, we will obtain a high-volume lumbar puncture and see if he gets a temporary improvement, especially with the gait and bladder function.  If this occurs, we will refer him to neurosurgery for a VP or similar shunt.  I am going to also check some blood work for treatable causes of polyneuropathy.  If this worsens we will consider NCV/EMG.  He will return to see me in 4 months or sooner depending on the results of the studies.  They will call if he has new or worsening symptoms.   Jeffrey Wheeler A. Felecia Shelling, MD, [facility] 23/36/1224, 4:97 AM Certified in Neurology, Clinical Neurophysiology, Sleep Medicine and Neuroimaging  [facility] [address] [place], [place] [address] [phone]

## 2020-08-05 LAB — VITAMIN B12: Vitamin B-12: 196 pg/mL — ABNORMAL LOW (ref 232–1245)

## 2020-08-05 LAB — MULTIPLE MYELOMA PANEL, SERUM

## 2020-08-08 ENCOUNTER — Other Ambulatory Visit: Payer: Self-pay

## 2020-08-08 ENCOUNTER — Ambulatory Visit
Admission: RE | Admit: 2020-08-08 | Discharge: 2020-08-08 | Disposition: A | Discharge status: Home / Self Care | Payer: Medicare PPO | Source: Ambulatory Visit | Attending: Neurology | Admitting: Neurology

## 2020-08-08 DIAGNOSIS — R269 Unspecified abnormalities of gait and mobility: Secondary | ICD-10-CM | POA: Diagnosis not present

## 2020-08-08 DIAGNOSIS — G912 (Idiopathic) normal pressure hydrocephalus: Secondary | ICD-10-CM | POA: Diagnosis not present

## 2020-08-08 LAB — MULTIPLE MYELOMA PANEL, SERUM
Albumin SerPl Elph-Mcnc: 3.7 g/dL (ref 2.9–4.4)
Albumin/Glob SerPl: 1.4 (ref 0.7–1.7)
Alpha 1: 0.2 g/dL (ref 0.0–0.4)
Alpha2 Glob SerPl Elph-Mcnc: 0.8 g/dL (ref 0.4–1.0)
B-Globulin SerPl Elph-Mcnc: 0.9 g/dL (ref 0.7–1.3)
Gamma Glob SerPl Elph-Mcnc: 0.8 g/dL (ref 0.4–1.8)
Globulin, Total: 2.7 g/dL (ref 2.2–3.9)
IgA/Immunoglobulin A, Serum: 153 mg/dL (ref 61–437)
IgM (Immunoglobulin M), Srm: 125 mg/dL (ref 15–143)
Total Protein: 6.4 g/dL (ref 6.0–8.5)

## 2020-08-08 LAB — CSF CELL COUNT WITH DIFFERENTIAL
Lymphs, CSF: 92 % — ABNORMAL HIGH (ref 40–80)
Monocyte/Macrophage: 6 % — ABNORMAL LOW (ref 15–45)
RBC Count, CSF: 3 cells/uL — ABNORMAL HIGH
Segmented Neutrophils-CSF: 2 % (ref 0–6)
WBC, CSF: 5 cells/uL (ref 0–5)

## 2020-08-08 LAB — SJOGREN'S SYNDROME ANTIBODS(SSA + SSB)
ENA SSA (RO) Ab: <0.2 AI (ref 0.0–0.9)
ENA SSB (LA) Ab: <0.2 AI (ref 0.0–0.9)

## 2020-08-08 LAB — GLUCOSE, CSF: Glucose, CSF: 59 mg/dL (ref 40–80)

## 2020-08-08 LAB — RHEUMATOID FACTOR: Rhuematoid fact SerPl-aCnc: <10 IU/mL (ref 0.0–13.9)

## 2020-08-08 LAB — PROTEIN, CSF: Total Protein, CSF: 56 mg/dL (ref 15–60)

## 2020-08-08 NOTE — Discharge Instructions (Signed)

## 2020-08-09 ENCOUNTER — Telehealth: Payer: Self-pay | Admitting: Neurology

## 2020-08-09 NOTE — Telephone Encounter (Signed)
He had a high-volume lumbar puncture yesterday.  So far, he has not noted much difference in gait though he did only wake up once last night to use the bathroom (was going 3 or 4 times every night).  I have asked him and his wife to monitor his progress over the next couple days and we will talk to him on Monday to see if there has been a benefit.

## 2020-08-14 ENCOUNTER — Telehealth: Payer: Self-pay | Admitting: Neurology

## 2020-08-14 DIAGNOSIS — G912 (Idiopathic) normal pressure hydrocephalus: Secondary | ICD-10-CM

## 2020-08-14 DIAGNOSIS — N3941 Urge incontinence: Secondary | ICD-10-CM

## 2020-08-14 DIAGNOSIS — R269 Unspecified abnormalities of gait and mobility: Secondary | ICD-10-CM

## 2020-08-14 NOTE — Telephone Encounter (Signed)
Jeffrey Wheeler had a high-volume lumbar puncture last week.  2 days later, he felt he was walking better than he had in the previous year.  He was able to take steps without a walker or cane.  Additionally, the bladder improved.    He notes that the gait and bladder symptoms have started to return over the last 2 days though he is still better today than he was before the lumbar puncture.  We discussed referral to neurosurgery for a shunt for the normal pressure hydrocephalus.  He would like for [place] to go ahead and make the referral.

## 2020-08-14 NOTE — Telephone Encounter (Signed)
Phone rep worked Special educational needs teacher, pt's wife left a vm this morning asking for a call from RN so pt can relay himself how he is doing after the LP.  Wife is asking that they be called anytime this afternoon until 10:00 tomorrow morning.

## 2020-08-14 NOTE — Telephone Encounter (Signed)
Called pt back. Reports he only felt good for 2-3 days post LP. Now can only walk 30-40 ft without getting out of breathe. Feels fine but concerned about walking.

## 2020-08-15 DIAGNOSIS — L988 Other specified disorders of the skin and subcutaneous tissue: Secondary | ICD-10-CM | POA: Diagnosis not present

## 2020-08-15 DIAGNOSIS — C44612 Basal cell carcinoma of skin of right upper limb, including shoulder: Secondary | ICD-10-CM | POA: Diagnosis not present

## 2020-08-15 DIAGNOSIS — L570 Actinic keratosis: Secondary | ICD-10-CM | POA: Diagnosis not present

## 2020-08-15 DIAGNOSIS — H6122 Impacted cerumen, left ear: Secondary | ICD-10-CM | POA: Diagnosis not present

## 2020-08-15 DIAGNOSIS — L209 Atopic dermatitis, unspecified: Secondary | ICD-10-CM | POA: Diagnosis not present

## 2020-08-15 DIAGNOSIS — L820 Inflamed seborrheic keratosis: Secondary | ICD-10-CM | POA: Diagnosis not present

## 2020-08-15 DIAGNOSIS — D485 Neoplasm of uncertain behavior of skin: Secondary | ICD-10-CM | POA: Diagnosis not present

## 2020-08-17 DIAGNOSIS — G912 (Idiopathic) normal pressure hydrocephalus: Secondary | ICD-10-CM | POA: Diagnosis not present

## 2020-08-21 ENCOUNTER — Other Ambulatory Visit: Payer: Self-pay | Admitting: Neurological Surgery

## 2020-08-22 ENCOUNTER — Encounter (HOSPITAL_COMMUNITY): Payer: Self-pay | Admitting: Neurological Surgery

## 2020-08-22 ENCOUNTER — Other Ambulatory Visit: Payer: Self-pay

## 2020-08-22 NOTE — Progress Notes (Signed)
Spoke with pt's wife, Letta Median for pre-op call. DPR on file and she states pt is very hard of hearing. She states pt does not have a cardiac history, HTN or Diabetes.  Pt will need Covid test on arrival. They live in [place] and too far to come to do the test prior to surgery.

## 2020-08-23 ENCOUNTER — Encounter (HOSPITAL_COMMUNITY): Payer: Self-pay | Admitting: Neurological Surgery

## 2020-08-23 ENCOUNTER — Inpatient Hospital Stay (HOSPITAL_COMMUNITY): Payer: Medicare PPO | Admitting: Certified Registered"

## 2020-08-23 ENCOUNTER — Encounter (HOSPITAL_COMMUNITY)
Admission: RE | Disposition: A | Discharge status: Home / Self Care | Payer: Self-pay | Source: Home / Self Care | Attending: Neurological Surgery

## 2020-08-23 ENCOUNTER — Inpatient Hospital Stay (HOSPITAL_COMMUNITY)
Admission: RE | Admit: 2020-08-23 | Discharge: 2020-08-24 | DRG: 033 | Disposition: A | Discharge status: Home / Self Care | Payer: Medicare PPO | Attending: Neurological Surgery | Admitting: Neurological Surgery

## 2020-08-23 ENCOUNTER — Other Ambulatory Visit: Payer: Self-pay

## 2020-08-23 ENCOUNTER — Inpatient Hospital Stay (HOSPITAL_COMMUNITY): Payer: Medicare PPO

## 2020-08-23 DIAGNOSIS — Z20822 Contact with and (suspected) exposure to covid-19: Secondary | ICD-10-CM | POA: Diagnosis not present

## 2020-08-23 DIAGNOSIS — Z807 Family history of other malignant neoplasms of lymphoid, hematopoietic and related tissues: Secondary | ICD-10-CM

## 2020-08-23 DIAGNOSIS — G919 Hydrocephalus, unspecified: Secondary | ICD-10-CM

## 2020-08-23 DIAGNOSIS — H409 Unspecified glaucoma: Secondary | ICD-10-CM | POA: Diagnosis not present

## 2020-08-23 DIAGNOSIS — E7800 Pure hypercholesterolemia, unspecified: Secondary | ICD-10-CM | POA: Diagnosis present

## 2020-08-23 DIAGNOSIS — R9082 White matter disease, unspecified: Secondary | ICD-10-CM | POA: Diagnosis not present

## 2020-08-23 DIAGNOSIS — G912 (Idiopathic) normal pressure hydrocephalus: Principal | ICD-10-CM | POA: Diagnosis present

## 2020-08-23 DIAGNOSIS — I672 Cerebral atherosclerosis: Secondary | ICD-10-CM | POA: Diagnosis not present

## 2020-08-23 DIAGNOSIS — Z85828 Personal history of other malignant neoplasm of skin: Secondary | ICD-10-CM | POA: Diagnosis not present

## 2020-08-23 DIAGNOSIS — G9389 Other specified disorders of brain: Secondary | ICD-10-CM | POA: Diagnosis not present

## 2020-08-23 DIAGNOSIS — C44622 Squamous cell carcinoma of skin of right upper limb, including shoulder: Secondary | ICD-10-CM | POA: Diagnosis not present

## 2020-08-23 DIAGNOSIS — Z87891 Personal history of nicotine dependence: Secondary | ICD-10-CM

## 2020-08-23 DIAGNOSIS — M1990 Unspecified osteoarthritis, unspecified site: Secondary | ICD-10-CM | POA: Diagnosis present

## 2020-08-23 DIAGNOSIS — M419 Scoliosis, unspecified: Secondary | ICD-10-CM | POA: Diagnosis present

## 2020-08-23 DIAGNOSIS — Z9889 Other specified postprocedural states: Secondary | ICD-10-CM | POA: Diagnosis not present

## 2020-08-23 DIAGNOSIS — N400 Enlarged prostate without lower urinary tract symptoms: Secondary | ICD-10-CM | POA: Diagnosis not present

## 2020-08-23 DIAGNOSIS — K219 Gastro-esophageal reflux disease without esophagitis: Secondary | ICD-10-CM | POA: Diagnosis not present

## 2020-08-23 DIAGNOSIS — Z982 Presence of cerebrospinal fluid drainage device: Secondary | ICD-10-CM | POA: Diagnosis not present

## 2020-08-23 DIAGNOSIS — D1801 Hemangioma of skin and subcutaneous tissue: Secondary | ICD-10-CM | POA: Diagnosis not present

## 2020-08-23 HISTORY — DX: Pneumonia, unspecified organism: J18.9

## 2020-08-23 HISTORY — DX: Malignant (primary) neoplasm, unspecified: C80.1

## 2020-08-23 HISTORY — PX: VENTRICULOPERITONEAL SHUNT: SHX204

## 2020-08-23 LAB — CBC
HCT: 39 % (ref 39.0–52.0)
Hemoglobin: 14.2 g/dL (ref 13.0–17.0)
MCH: 31.3 pg (ref 26.0–34.0)
MCHC: 36.4 g/dL — ABNORMAL HIGH (ref 30.0–36.0)
MCV: 86.1 fL (ref 80.0–100.0)
Platelets: 251 10*3/uL (ref 150–400)
RBC: 4.53 MIL/uL (ref 4.22–5.81)
RDW: 14.4 % (ref 11.5–15.5)
WBC: 14.2 10*3/uL — ABNORMAL HIGH (ref 4.0–10.5)
nRBC: 0 % (ref 0.0–0.2)

## 2020-08-23 LAB — CREATININE, SERUM
Creatinine, Ser: 0.92 mg/dL (ref 0.61–1.24)
GFR, Estimated: >60 mL/min (ref >60)

## 2020-08-23 LAB — SARS CORONAVIRUS 2 BY RT PCR (HOSPITAL ORDER, PERFORMED IN ~~LOC~~ HOSPITAL LAB): SARS Coronavirus 2: NEGATIVE

## 2020-08-23 SURGERY — SHUNT INSERTION VENTRICULAR-PERITONEAL
Anesthesia: General | Site: Head | Laterality: Right

## 2020-08-23 MED ORDER — BACITRACIN ZINC 500 UNIT/GM EX OINT
TOPICAL_OINTMENT | CUTANEOUS | Status: DC | PRN
  Administered 2020-08-23: 1 via TOPICAL

## 2020-08-23 MED ORDER — FENTANYL CITRATE (PF) 100 MCG/2ML IJ SOLN
INTRAMUSCULAR | Status: DC | PRN
  Administered 2020-08-23: 50 ug via INTRAVENOUS
  Administered 2020-08-23: 100 ug via INTRAVENOUS

## 2020-08-23 MED ORDER — OXYCODONE HCL 5 MG/5ML PO SOLN
5.0000 mg | Freq: Once | ORAL | Status: AC | PRN
  Administered 2020-08-23: 5 mg via ORAL

## 2020-08-23 MED ORDER — LACTATED RINGERS IV SOLN: INTRAVENOUS | Status: DC

## 2020-08-23 MED ORDER — CHLORHEXIDINE GLUCONATE CLOTH 2 % EX PADS: 6.0000 | MEDICATED_PAD | Freq: Once | CUTANEOUS | Status: DC

## 2020-08-23 MED ORDER — PROPOFOL 10 MG/ML IV BOLUS
INTRAVENOUS | Status: DC | PRN
  Administered 2020-08-23: 30 mg via INTRAVENOUS
  Administered 2020-08-23: 140 mg via INTRAVENOUS

## 2020-08-23 MED ORDER — GABAPENTIN 300 MG PO CAPS
300.0000 mg | ORAL_CAPSULE | Freq: Two times a day (BID) | ORAL | Status: DC
  Administered 2020-08-23 – 2020-08-24 (×2): 300 mg via ORAL
  Filled 2020-08-23 (×2): qty 1

## 2020-08-23 MED ORDER — LIDOCAINE HCL (PF) 2 % IJ SOLN
INTRAMUSCULAR | Status: AC
  Filled 2020-08-23: qty 5

## 2020-08-23 MED ORDER — ACETAMINOPHEN 650 MG RE SUPP: 650.0000 mg | RECTAL | Status: DC | PRN

## 2020-08-23 MED ORDER — HYDROMORPHONE HCL 1 MG/ML IJ SOLN: 0.5000 mg | INTRAMUSCULAR | Status: DC | PRN

## 2020-08-23 MED ORDER — ONDANSETRON HCL 4 MG/2ML IJ SOLN: 4.0000 mg | Freq: Once | INTRAMUSCULAR | Status: DC | PRN

## 2020-08-23 MED ORDER — PANTOPRAZOLE SODIUM 40 MG PO TBEC
40.0000 mg | DELAYED_RELEASE_TABLET | Freq: Two times a day (BID) | ORAL | Status: DC
  Administered 2020-08-24: 40 mg via ORAL
  Filled 2020-08-23: qty 1

## 2020-08-23 MED ORDER — 0.9 % SODIUM CHLORIDE (POUR BTL) OPTIME
TOPICAL | Status: DC | PRN
  Administered 2020-08-23: 1000 mL

## 2020-08-23 MED ORDER — HYDROCODONE-ACETAMINOPHEN 5-325 MG PO TABS
1.0000 | ORAL_TABLET | ORAL | Status: DC | PRN
  Administered 2020-08-23 – 2020-08-24 (×2): 1 via ORAL
  Filled 2020-08-23 (×2): qty 1

## 2020-08-23 MED ORDER — DOCUSATE SODIUM 100 MG PO CAPS
300.0000 mg | ORAL_CAPSULE | Freq: Every day | ORAL | Status: DC
  Administered 2020-08-23: 300 mg via ORAL
  Filled 2020-08-23: qty 3

## 2020-08-23 MED ORDER — PHENYLEPHRINE HCL-NACL 10-0.9 MG/250ML-% IV SOLN
INTRAVENOUS | Status: DC | PRN
  Administered 2020-08-23: 25 ug/min via INTRAVENOUS

## 2020-08-23 MED ORDER — CEFAZOLIN SODIUM-DEXTROSE 2-4 GM/100ML-% IV SOLN
2.0000 g | Freq: Three times a day (TID) | INTRAVENOUS | Status: AC
  Administered 2020-08-23 – 2020-08-24 (×2): 2 g via INTRAVENOUS
  Filled 2020-08-23 (×2): qty 100

## 2020-08-23 MED ORDER — LIDOCAINE-EPINEPHRINE 1 %-1:100000 IJ SOLN
INTRAMUSCULAR | Status: AC
  Filled 2020-08-23: qty 1

## 2020-08-23 MED ORDER — FENTANYL CITRATE (PF) 250 MCG/5ML IJ SOLN
INTRAMUSCULAR | Status: AC
  Filled 2020-08-23: qty 5

## 2020-08-23 MED ORDER — DEXAMETHASONE SODIUM PHOSPHATE 10 MG/ML IJ SOLN
INTRAMUSCULAR | Status: DC | PRN
  Administered 2020-08-23 (×2): 10 mg via INTRAVENOUS

## 2020-08-23 MED ORDER — THROMBIN 5000 UNITS EX SOLR
CUTANEOUS | Status: AC
  Filled 2020-08-23: qty 5000

## 2020-08-23 MED ORDER — ONDANSETRON HCL 4 MG/2ML IJ SOLN
INTRAMUSCULAR | Status: AC
  Filled 2020-08-23: qty 2

## 2020-08-23 MED ORDER — HEPARIN SODIUM (PORCINE) 5000 UNIT/ML IJ SOLN: 5000.0000 [IU] | Freq: Three times a day (TID) | INTRAMUSCULAR | Status: DC

## 2020-08-23 MED ORDER — CEFAZOLIN SODIUM-DEXTROSE 2-4 GM/100ML-% IV SOLN
2.0000 g | INTRAVENOUS | Status: DC
  Filled 2020-08-23: qty 100

## 2020-08-23 MED ORDER — PROMETHAZINE HCL 12.5 MG PO TABS
12.5000 mg | ORAL_TABLET | ORAL | Status: DC | PRN
  Filled 2020-08-23: qty 2

## 2020-08-23 MED ORDER — LATANOPROST 0.005 % OP SOLN
1.0000 [drp] | Freq: Every day | OPHTHALMIC | Status: DC
  Administered 2020-08-23: 1 [drp] via OPHTHALMIC
  Filled 2020-08-23: qty 2.5

## 2020-08-23 MED ORDER — ACETAMINOPHEN 325 MG PO TABS: 650.0000 mg | ORAL_TABLET | ORAL | Status: DC | PRN

## 2020-08-23 MED ORDER — FENTANYL CITRATE (PF) 100 MCG/2ML IJ SOLN
25.0000 ug | INTRAMUSCULAR | Status: DC | PRN
  Administered 2020-08-23: 25 ug via INTRAVENOUS

## 2020-08-23 MED ORDER — PROPOFOL 10 MG/ML IV BOLUS
INTRAVENOUS | Status: AC
  Filled 2020-08-23: qty 20

## 2020-08-23 MED ORDER — FENTANYL CITRATE (PF) 100 MCG/2ML IJ SOLN
INTRAMUSCULAR | Status: AC
  Filled 2020-08-23: qty 2

## 2020-08-23 MED ORDER — MIRABEGRON ER 25 MG PO TB24
25.0000 mg | ORAL_TABLET | Freq: Every day | ORAL | Status: DC
  Administered 2020-08-23: 25 mg via ORAL
  Filled 2020-08-23: qty 1

## 2020-08-23 MED ORDER — VITAMIN B-12 1000 MCG PO TABS
1000.0000 ug | ORAL_TABLET | Freq: Every day | ORAL | Status: DC
  Administered 2020-08-23: 1000 ug via ORAL
  Filled 2020-08-23: qty 1

## 2020-08-23 MED ORDER — CHLORHEXIDINE GLUCONATE 0.12 % MT SOLN
15.0000 mL | Freq: Once | OROMUCOSAL | Status: DC
  Filled 2020-08-23: qty 15

## 2020-08-23 MED ORDER — DOXAZOSIN MESYLATE 8 MG PO TABS
8.0000 mg | ORAL_TABLET | Freq: Every day | ORAL | Status: DC
  Administered 2020-08-23: 8 mg via ORAL
  Filled 2020-08-23: qty 1

## 2020-08-23 MED ORDER — DEXAMETHASONE SODIUM PHOSPHATE 10 MG/ML IJ SOLN
INTRAMUSCULAR | Status: AC
  Filled 2020-08-23: qty 1

## 2020-08-23 MED ORDER — ROCURONIUM BROMIDE 10 MG/ML (PF) SYRINGE
PREFILLED_SYRINGE | INTRAVENOUS | Status: DC | PRN
  Administered 2020-08-23: 40 mg via INTRAVENOUS

## 2020-08-23 MED ORDER — CEFAZOLIN SODIUM-DEXTROSE 2-3 GM-%(50ML) IV SOLR
INTRAVENOUS | Status: DC | PRN
  Administered 2020-08-23: 2 g via INTRAVENOUS

## 2020-08-23 MED ORDER — SUCCINYLCHOLINE CHLORIDE 20 MG/ML IJ SOLN
INTRAMUSCULAR | Status: DC | PRN
  Administered 2020-08-23: 100 mg via INTRAVENOUS

## 2020-08-23 MED ORDER — OXYCODONE HCL 5 MG PO TABS: 5.0000 mg | ORAL_TABLET | Freq: Once | ORAL | Status: AC | PRN

## 2020-08-23 MED ORDER — BACITRACIN ZINC 500 UNIT/GM EX OINT
TOPICAL_OINTMENT | CUTANEOUS | Status: AC
  Filled 2020-08-23: qty 28.35

## 2020-08-23 MED ORDER — ORAL CARE MOUTH RINSE: 15.0000 mL | Freq: Once | OROMUCOSAL | Status: DC

## 2020-08-23 MED ORDER — VALACYCLOVIR HCL 500 MG PO TABS
1000.0000 mg | ORAL_TABLET | Freq: Every day | ORAL | Status: DC
  Administered 2020-08-23: 1000 mg via ORAL
  Filled 2020-08-23: qty 2

## 2020-08-23 MED ORDER — OXYCODONE HCL 5 MG/5ML PO SOLN
ORAL | Status: AC
  Filled 2020-08-23: qty 5

## 2020-08-23 MED ORDER — ROCURONIUM BROMIDE 10 MG/ML (PF) SYRINGE
PREFILLED_SYRINGE | INTRAVENOUS | Status: AC
  Filled 2020-08-23: qty 10

## 2020-08-23 MED ORDER — ONDANSETRON HCL 4 MG PO TABS: 4.0000 mg | ORAL_TABLET | ORAL | Status: DC | PRN

## 2020-08-23 MED ORDER — LIDOCAINE 2% (20 MG/ML) 5 ML SYRINGE
INTRAMUSCULAR | Status: DC | PRN
  Administered 2020-08-23: 60 mg via INTRAVENOUS

## 2020-08-23 MED ORDER — ONDANSETRON HCL 4 MG/2ML IJ SOLN: 4.0000 mg | INTRAMUSCULAR | Status: DC | PRN

## 2020-08-23 MED ORDER — EPHEDRINE SULFATE 50 MG/ML IJ SOLN
INTRAMUSCULAR | Status: DC | PRN
  Administered 2020-08-23: 10 mg via INTRAVENOUS

## 2020-08-23 MED ORDER — LIDOCAINE-EPINEPHRINE 1 %-1:100000 IJ SOLN
INTRAMUSCULAR | Status: DC | PRN
  Administered 2020-08-23: 10 mL

## 2020-08-23 MED ORDER — POLYETHYLENE GLYCOL 3350 17 G PO PACK: 17.0000 g | PACK | Freq: Every day | ORAL | Status: DC | PRN

## 2020-08-23 MED ORDER — SENNA 8.6 MG PO TABS
2.0000 | ORAL_TABLET | Freq: Every day | ORAL | Status: DC
  Administered 2020-08-23: 17.2 mg via ORAL
  Filled 2020-08-23: qty 2

## 2020-08-23 MED ORDER — ROCURONIUM BROMIDE 10 MG/ML (PF) SYRINGE: PREFILLED_SYRINGE | INTRAVENOUS | Status: DC | PRN

## 2020-08-23 MED ORDER — LABETALOL HCL 5 MG/ML IV SOLN: 10.0000 mg | INTRAVENOUS | Status: DC | PRN

## 2020-08-23 MED ORDER — DORZOLAMIDE HCL-TIMOLOL MAL 2-0.5 % OP SOLN
1.0000 [drp] | Freq: Two times a day (BID) | OPHTHALMIC | Status: DC
  Administered 2020-08-23 – 2020-08-24 (×2): 1 [drp] via OPHTHALMIC
  Filled 2020-08-23: qty 10

## 2020-08-23 MED ORDER — THROMBIN 5000 UNITS EX SOLR
OROMUCOSAL | Status: DC | PRN
  Administered 2020-08-23: 5 mL via TOPICAL

## 2020-08-23 MED ORDER — SUCCINYLCHOLINE CHLORIDE 200 MG/10ML IV SOSY
PREFILLED_SYRINGE | INTRAVENOUS | Status: AC
  Filled 2020-08-23: qty 10

## 2020-08-23 MED ORDER — ONDANSETRON HCL 4 MG/2ML IJ SOLN
INTRAMUSCULAR | Status: DC | PRN
  Administered 2020-08-23: 4 mg via INTRAVENOUS

## 2020-08-23 SURGICAL SUPPLY — 65 items
BLADE CLIPPER SURG (BLADE) ×2 IMPLANT
BLADE SURG 11 STRL SS (BLADE) ×4 IMPLANT
BOOT SUTURE AID YELLOW STND (SUTURE) ×2 IMPLANT
BUR ACORN 9.0 PRECISION (BURR) ×2 IMPLANT
CANISTER SUCT 3000ML PPV (MISCELLANEOUS) ×2 IMPLANT
CLIP RANEY DISP (INSTRUMENTS) IMPLANT
COVER WAND RF STERILE (DRAPES) IMPLANT
DECANTER SPIKE VIAL GLASS SM (MISCELLANEOUS) IMPLANT
DERMABOND ADVANCED (GAUZE/BANDAGES/DRESSINGS) ×1
DERMABOND ADVANCED .7 DNX12 (GAUZE/BANDAGES/DRESSINGS) ×1 IMPLANT
DRAPE HALF SHEET 40X57 (DRAPES) ×2 IMPLANT
DRAPE INCISE IOBAN 66X45 STRL (DRAPES) ×2 IMPLANT
DRAPE ORTHO SPLIT 77X108 STRL (DRAPES) ×2
DRAPE SURG ORHT 6 SPLT 77X108 (DRAPES) ×2 IMPLANT
DURAPREP 26ML APPLICATOR (WOUND CARE) ×4 IMPLANT
ELECT REM PT RETURN 9FT ADLT (ELECTROSURGICAL) ×2
ELECTRODE REM PT RTRN 9FT ADLT (ELECTROSURGICAL) ×1 IMPLANT
GAUZE 4X4 16PLY RFD (DISPOSABLE) IMPLANT
GLOVE BIO SURGEON STRL SZ 6.5 (GLOVE) IMPLANT
GLOVE BIO SURGEON STRL SZ7.5 (GLOVE) ×4 IMPLANT
GLOVE BIOGEL PI IND STRL 6.5 (GLOVE) IMPLANT
GLOVE BIOGEL PI IND STRL 7.0 (GLOVE) ×1 IMPLANT
GLOVE BIOGEL PI INDICATOR 6.5 (GLOVE)
GLOVE BIOGEL PI INDICATOR 7.0 (GLOVE) ×1
GLOVE EXAM NITRILE XL STR (GLOVE) IMPLANT
GLOVE INDICATOR 7.5 STRL GRN (GLOVE) ×2 IMPLANT
GLOVE SURG SS PI 6.0 STRL IVOR (GLOVE) ×4 IMPLANT
GLOVE SURG UNDER POLY LF SZ6.5 (GLOVE) ×2 IMPLANT
GOWN STRL REUS W/ TWL LRG LVL3 (GOWN DISPOSABLE) ×2 IMPLANT
GOWN STRL REUS W/ TWL XL LVL3 (GOWN DISPOSABLE) IMPLANT
GOWN STRL REUS W/TWL 2XL LVL3 (GOWN DISPOSABLE) IMPLANT
GOWN STRL REUS W/TWL LRG LVL3 (GOWN DISPOSABLE) ×2
GOWN STRL REUS W/TWL XL LVL3 (GOWN DISPOSABLE)
HEMOSTAT POWDER KIT SURGIFOAM (HEMOSTASIS) ×2 IMPLANT
HEMOSTAT SURGICEL 2X14 (HEMOSTASIS) IMPLANT
KIT BASIN OR (CUSTOM PROCEDURE TRAY) ×2 IMPLANT
KIT TURNOVER KIT B (KITS) ×2 IMPLANT
MARKER SKIN DUAL TIP RULER LAB (MISCELLANEOUS) ×2 IMPLANT
NEEDLE HYPO 22GX1.5 SAFETY (NEEDLE) ×2 IMPLANT
NS IRRIG 1000ML POUR BTL (IV SOLUTION) ×2 IMPLANT
PACK LAMINECTOMY NEURO (CUSTOM PROCEDURE TRAY) ×2 IMPLANT
PAD ARMBOARD 7.5X6 YLW CONV (MISCELLANEOUS) ×6 IMPLANT
PASSER CATH 65CM DISP (NEUROSURGERY SUPPLIES) ×2 IMPLANT
SHEATH PERITONEAL INTRO 61 (SHEATH) IMPLANT
SPONGE INTESTINAL PEANUT (DISPOSABLE) ×2 IMPLANT
SPONGE LAP 4X18 RFD (DISPOSABLE) IMPLANT
STAPLER SKIN PROX WIDE 3.9 (STAPLE) ×2 IMPLANT
STRIP CLOSURE SKIN 1/2X4 (GAUZE/BANDAGES/DRESSINGS) ×2 IMPLANT
SUT ETHILON 3 0 FSL (SUTURE) IMPLANT
SUT MON AB 3-0 SH 27 (SUTURE) ×2
SUT MON AB 3-0 SH27 (SUTURE) ×2 IMPLANT
SUT NURALON 4 0 TR CR/8 (SUTURE) IMPLANT
SUT SILK 0 TIES 10X30 (SUTURE) IMPLANT
SUT SILK 2 0 TIES 10X30 (SUTURE) ×2 IMPLANT
SUT SILK 3 0 SH 30 (SUTURE) IMPLANT
SUT VIC AB 2-0 CP2 18 (SUTURE) ×2 IMPLANT
SUT VIC AB 2-0 CT2 18 VCP726D (SUTURE) ×2 IMPLANT
SUT VIC AB 3-0 SH 8-18 (SUTURE) ×4 IMPLANT
SYR CONTROL 10ML LL (SYRINGE) ×2 IMPLANT
TOWEL GREEN STERILE (TOWEL DISPOSABLE) ×2 IMPLANT
TOWEL GREEN STERILE FF (TOWEL DISPOSABLE) ×2 IMPLANT
TUBE CONNECTING 12X1/4 (SUCTIONS) ×2 IMPLANT
UNDERPAD 30X36 HEAVY ABSORB (UNDERPADS AND DIAPERS) IMPLANT
VALVE PROGRAM CERTAS SM ANTI (Valve) ×2 IMPLANT
WATER STERILE IRR 1000ML POUR (IV SOLUTION) ×2 IMPLANT

## 2020-08-23 NOTE — H&P (Signed)
Surgical H&P Update  HPI: 84 y.o. man with a history of progressive magnetic gait, urinary incontinence, and confusion suspicious for NPH. Imaging confirmed the presence of ventriculomegaly. He had a positive result to a high volume LP and is here today for placement of a R VPS. No changes in health since I last saw him, no new symptoms or concerns. Still having symptoms and wishes to proceed with surgery.  PMHx:  Past Medical History:  Diagnosis Date  . Back pain   . BPH (benign prostatic hyperplasia)   . Cancer ([place])    basal cell skin cancer  . Coxsackie viruses    age 7  . Gastroesophageal reflux disease   . Generalized hyperhidrosis   . GERD (gastroesophageal reflux disease)   . Glaucoma   . High cholesterol   . Osteoarthritis   . Pneumonia    aspiration pneumonia  . Primary localized osteoarthritis of right knee   . Prostate disease   . Scoliosis    FamHx:  Family History  Problem Relation Age of Onset  . Diabetes Other   . CAD Other   . Hypertension Other   . CVA Other   . Multiple myeloma Mother   . Cirrhosis Father    SocHx:  reports that he has quit smoking. He quit smokeless tobacco use about 19 years ago. He reports current alcohol use of about 3.0 standard drinks of alcohol per week. He reports that he does not use drugs.  Physical Exam: AOx3, PERRL, EOMI, FS, strength 5/5x4, magnetic shuffling gait  Assesment/Plan: 84 y.o. man with NPH, here for R VPS placement. Risks, benefits, and alternatives discussed and the patient would like to continue with surgery.  -OR today -4N post-op  Judith Part, MD 08/23/20 12:29 PM

## 2020-08-23 NOTE — Brief Op Note (Signed)
08/23/2020  2:59 PM  PATIENT:  Jeffrey Wheeler  84 y.o. male  PRE-OPERATIVE DIAGNOSIS:  Normal pressure hydrocephalus  POST-OPERATIVE DIAGNOSIS:  Normal pressure hydrocephalus  PROCEDURE:  Procedure(s): Right ventriculoperitoneal shunt placement (Right)  SURGEON:  Surgeon(s) and Role:    * Judith Part, MD - Primary  PHYSICIAN ASSISTANT:   ASSISTANTS: none   ANESTHESIA:   general  EBL:  25cc  BLOOD ADMINISTERED:none  DRAINS: none   LOCAL MEDICATIONS USED:  LIDOCAINE   SPECIMEN:  No Specimen  DISPOSITION OF SPECIMEN:  N/A  COUNTS:  YES  TOURNIQUET:  * No tourniquets in log *  DICTATION: .Note written in EPIC  PLAN OF CARE: Admit to inpatient   PATIENT DISPOSITION:  PACU - hemodynamically stable.   Delay start of Pharmacological VTE agent (>24hrs) due to surgical blood loss or risk of bleeding: yes

## 2020-08-23 NOTE — Anesthesia Procedure Notes (Signed)
Procedure Name: Intubation Date/Time: 08/23/2020 12:55 PM Performed by: Lavell Luster, CRNA Pre-anesthesia Checklist: Patient identified, Emergency Drugs available, Suction available, Patient being monitored and Timeout performed Patient Re-evaluated:Patient Re-evaluated prior to induction Oxygen Delivery Method: Circle system utilized Preoxygenation: Pre-oxygenation with 100% oxygen Induction Type: IV induction Ventilation: Mask ventilation without difficulty Laryngoscope Size: Mac, 4 and Glidescope Grade View: Grade I Tube size: 7.5 mm Number of attempts: 2 Airway Equipment and Method: Stylet and Video-laryngoscopy Placement Confirmation: ETT inserted through vocal cords under direct vision,  positive ETCO2 and breath sounds checked- equal and bilateral Secured at: 22 cm Tube secured with: Tape Dental Injury: Teeth and Oropharynx as per pre-operative assessment  Difficulty Due To: Difficult Airway- due to reduced neck mobility, Difficult Airway- due to anterior larynx and Difficult Airway- due to immobile epiglottis Future Recommendations: Recommend- induction with short-acting agent, and alternative techniques readily available

## 2020-08-23 NOTE — Discharge Instructions (Signed)
Discharge Instructions  No restriction in activities, slowly increase your activity back to normal.   Your scalp and neck incisions are closed with absorbable sutures and your abdomen is closed with absorbable sutures covered by glue. These will naturally fall off over the next 4-6 weeks. If they become bothersome or cause discomfort, apply some antibiotic ointment like bacitracin or neosporin on the sutures. This will soften them up and usually makes them more comfortable while they dissolve.  Okay to shower on the day of discharge. Be gentle when cleaning your incision. Use regular soap and water. If that is uncomfortable, try using baby shampoo. Do not submerge the wound under water for 2 weeks after surgery.  Follow up with Dr. Zada Finders in 2 weeks after discharge. If you do not already have a discharge appointment, please call his office at [phone] to schedule a follow up appointment. If you have any concerns or questions, please call the office and let us know.

## 2020-08-23 NOTE — Anesthesia Postprocedure Evaluation (Signed)
Anesthesia Post Note  Patient: Jeffrey Wheeler  Procedure(s) Performed: Right ventriculoperitoneal shunt placement (Right Head)     Patient location during evaluation: PACU Anesthesia Type: General Level of consciousness: awake and alert Pain management: pain level controlled Vital Signs Assessment: post-procedure vital signs reviewed and stable Respiratory status: spontaneous breathing, nonlabored ventilation, respiratory function stable and patient connected to nasal cannula oxygen Cardiovascular status: blood pressure returned to baseline and stable Postop Assessment: no apparent nausea or vomiting Anesthetic complications: no   No complications documented.  Last Vitals:  Vitals:   08/23/20 1630 08/23/20 1650  BP:  (!) 159/82  Pulse: 65 63  Resp: 15 12  Temp:    SpO2: 97% 99%    Last Pain:  Vitals:   08/23/20 1530  TempSrc:   PainSc: 0-No pain                 Barnet Glasgow

## 2020-08-23 NOTE — Op Note (Signed)
PATIENT: Jeffrey Wheeler  DAY OF SURGERY: 08/23/20   PRE-OPERATIVE DIAGNOSIS:  Normal pressure hydrocephalus   POST-OPERATIVE DIAGNOSIS:  Normal pressure hydrocephalus   PROCEDURE:  Right ventriculoperitoneal shunt placement   SURGEON:  Surgeon(s) and Role:    Judith Part, MD - Primary   ANESTHESIA: ETGA   BRIEF HISTORY: This is an 84 year old man who presented with signs and symptoms of NPH with a good response to high volume LP. I therefore recommended shunt placement. This was discussed with the patient and his wife as well as risks, benefits, and alternatives and wished to proceed with surgery.   OPERATIVE DETAIL: The patient was taken to the operating room and placed on the OR table in the supine position. A formal time out was performed with two patient identifiers and confirmed the operative site. Anesthesia was induced by the anesthesia team. The head was turned contralaterally and a bump was placed under the ipsilateral shoulder to assist with tunneling. The operative sites were marked, hair was clipped with surgical clippers, and the area was then prepped and draped in a sterile fashion along the entire length of the shunt system. A curvilinear incision was placed on the right scalp over Kocher's point. A burr hole was placed, dura coagulated, and a ventricular catheter was inserted using standard landmarks until CSF was obtained then soft passed to a depth of 7cm with good flow of clear CSF. A pocket was made for the valve and a relay incision was created behind the right ear. Using a tunneler, the distal catheter was then passed subcutaneously from the scalp incision to the previously marked abdominal incision. The abdominal incision was opened and a small laparotomy was created. Intra-peritoneal location was confirmed by visualizing small bowel. The shunt system was flushed then connected to the proximal catheter. After spontaneous flow was seen through the distal catheter, it was  then placed intraperitoneal without resistance. With the shunt system in place and functioning, all incisions were copiously irrigated and then closed in layers. All instrument and sponge counts were correct. The patient was then returned to anesthesia for emergence. No apparent complications at the completion of the procedure.  IMPLANTS: Certas shunt valve, set to performance level 4   EBL:  76mL   DRAINS: none   SPECIMENS: none   Judith Part, MD 08/23/20 12:31 PM

## 2020-08-23 NOTE — OR Nursing (Signed)
Pt is awake,alert and oriented.Pt and/or family verbalized understanding of poc and discharge instructions. Reviewed admission and on going care with receiving RN. Pt is in NAD at this time and is ready to be transferred to floor. Will con't to monitor until pt is transferred. Belongings on bed with patient  

## 2020-08-23 NOTE — Discharge Summary (Signed)
Discharge Summary  Date of Admission: 08/23/2020  Date of Discharge: 08/24/2020  Attending Physician: Emelda Brothers, MD  Hospital Course: Patient was admitted following an uncomplicated right ventriculoperitoneal shunt placement for NPH. He was recovered in PACU and transferred to 4NP post-op. Post-op CTH and shunt series showed hardware in place without complicating feature. His hospital course was uncomplicated and the patient was discharged home on 08/24/2020. He will follow up in clinic with me in 2 weeks.  Neurologic exam at discharge:  AOx3, PERRL, EOMI, FS, TM Strength 5/5 x4, SILTx4  Discharge diagnosis: Normal pressure hydrocephalus  Judith Part, MD 08/23/20 3:08 PM

## 2020-08-23 NOTE — Transfer of Care (Signed)
Immediate Anesthesia Transfer of Care Note  Patient: Jeffrey Wheeler  Procedure(s) Performed: Right ventriculoperitoneal shunt placement (Right Head)  Patient Location: PACU  Anesthesia Type:General  Level of Consciousness: drowsy, patient cooperative and responds to stimulation  Airway & Oxygen Therapy: Patient Spontanous Breathing  Post-op Assessment: Report given to RN and Post -op Vital signs reviewed and stable  Post vital signs: Reviewed and stable  Last Vitals:  Vitals Value Taken Time  BP 154/84 08/23/20 1504  Temp    Pulse 72 08/23/20 1505  Resp 22 08/23/20 1505  SpO2 96 % 08/23/20 1505  Vitals shown include unvalidated device data.  Last Pain:  Vitals:   08/23/20 0937  TempSrc:   PainSc: 0-No pain         Complications: No complications documented.

## 2020-08-23 NOTE — Anesthesia Preprocedure Evaluation (Addendum)
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   GI/Hepatic Neg liver ROS, GERD  Medicated and Controlled,  Endo/Other   Obesity   Renal/GU negative Renal ROS     Musculoskeletal  (+) Arthritis ,  Scoliosis    Abdominal   Peds  Hematology negative hematology ROS (+)   Anesthesia Other Findings Covid test negative    Reproductive/Obstetrics                           Anesthesia Physical Anesthesia Plan  ASA: III  Anesthesia Plan: General   Post-op Pain Management:    Induction: Intravenous  PONV Risk Score and Plan: 3 and Treatment may vary due to age or medical condition, Ondansetron and Dexamethasone  Airway Management Planned: Oral ETT  Additional Equipment: None  Intra-op Plan:   Post-operative Plan: Extubation in OR  Informed Consent: I have reviewed the patients History and Physical, chart, labs and discussed the procedure including the risks, benefits and alternatives for the proposed anesthesia with the patient or authorized representative who has indicated his/her understanding and acceptance.     Dental advisory given  Plan Discussed with: CRNA and Anesthesiologist  Anesthesia Plan Comments:        Anesthesia Quick Evaluation

## 2020-08-24 ENCOUNTER — Encounter (HOSPITAL_COMMUNITY): Payer: Self-pay | Admitting: Neurological Surgery

## 2020-08-24 DIAGNOSIS — C44622 Squamous cell carcinoma of skin of right upper limb, including shoulder: Secondary | ICD-10-CM | POA: Diagnosis not present

## 2020-08-24 DIAGNOSIS — D1801 Hemangioma of skin and subcutaneous tissue: Secondary | ICD-10-CM | POA: Diagnosis not present

## 2020-08-24 MED ORDER — ASPIRIN EC 81 MG PO TBEC: 81.0000 mg | DELAYED_RELEASE_TABLET | Freq: Every day | ORAL | 11 refills | Status: AC

## 2020-08-24 MED ORDER — HYDROCODONE-ACETAMINOPHEN 5-325 MG PO TABS: 1.0000 | ORAL_TABLET | ORAL | 0 refills | Status: DC | PRN

## 2020-08-24 NOTE — TOC Transition Note (Signed)
Transition of Care [facility]) - CM/SW Discharge Note   Patient Details  Name: Geovanni Rahming MRN: [id number] Date of Birth: 09-11-1936  Transition of Care [facility] CM/SW Contact:  Pollie Friar, RN Phone Number: 08/24/2020, 12:12 PM   Clinical Narrative:    Pt discharging home with outpatient therapy. Pt asked to attend [facility] in [place]. Orders faxed to [place] and they will contact the patient for first appointment.  Pt has supervision at home and transport home.    Final next level of care: OP Rehab Barriers to Discharge: No Barriers Identified   Patient Goals and CMS Choice     Choice offered to / list presented to : Patient  Discharge Placement                       Discharge Plan and Services                                     Social Determinants of Health (SDOH) Interventions     Readmission Risk Interventions No flowsheet data found.

## 2020-08-24 NOTE — Evaluation (Signed)
Occupational Therapy Evaluation Patient Details Name: Jeffrey Wheeler MRN: [id number] DOB: 1936/02/04 Today's Date: 08/24/2020    History of Present Illness 84 y.o. man with a history of progressive magnetic gait, urinary incontinence, and confusion suspicious for NPH. Imaging confirmed the presence of ventriculomegaly. He had a positive result to a high volume LP and underwent R VP shunt placement on 08/23/2020.   Clinical Impression   PTA patient was living with his wife in a private residence and was ambulating in home and short distances in community with use of walking stick. Patient was requiring occasional assist for LB ADLs from wife 2/2 chronic back pain and unsteadiness. Patient currently presents near baseline level of function requiring Min guard for functional transfers and short-distance functional mobility in hospital room with use of RW. Patient would benefit from continued acute OT services to address deficits in static/dynamic standing balance and safety with use of RW in home and community dwellings to decrease risk of falls.     Follow Up Recommendations  No OT follow up;Supervision/Assistance - 24 hour    Equipment Recommendations  None recommended by OT (Patient has all necessary DME)    Recommendations for Other Services       Precautions / Restrictions Precautions Precautions: Fall Precaution Comments: HOB>30 degrees Restrictions Weight Bearing Restrictions: No      Mobility Bed Mobility Overal bed mobility: Needs Assistance Bed Mobility: Supine to Sit     Supine to sit: Supervision     General bed mobility comments: Patient seated in recliner upon entry.    Transfers Overall transfer level: Needs assistance Equipment used: None;Rolling walker (2 wheeled) Transfers: Sit to/from Stand Sit to Stand: Min guard;Supervision         General transfer comment: Min guard from low recliner and commode with use of grab bar.    Balance Overall balance  assessment: Needs assistance Sitting-balance support: No upper extremity supported;Feet supported Sitting balance-Leahy Scale: Good Sitting balance - Comments: supervision to don undergarments at edge of bed   Standing balance support: Single extremity supported;Bilateral upper extremity supported Standing balance-Leahy Scale: Poor Standing balance comment: Reliant on DME to maintain balance.                           ADL either performed or assessed with clinical judgement   ADL Overall ADL's : Needs assistance/impaired Eating/Feeding: Independent   Grooming: Min guard;Standing Grooming Details (indicate cue type and reason): 3/3 grooming tasks standing at sink level with Min guard for steadying.         Upper Body Dressing : Set up;Sitting   Lower Body Dressing: Minimal assistance;Sit to/from stand   Toilet Transfer: Designer, television/film set Details (indicate cue type and reason): For steadying         Functional mobility during ADLs: Min guard;Supervision/safety General ADL Comments: Min guard with walking stick and S with use of RW for short-distance mobility in hosptial room.     Vision Baseline Vision/History: Wears glasses Wears Glasses: Reading only Vision Assessment?: No apparent visual deficits     Perception     Praxis      Pertinent Vitals/Pain Pain Assessment: 0-10 Pain Score: 5  Faces Pain Scale: Hurts Gott more Pain Location: abdomen, low back (chronic), and grimacing with general UE AROM assessment. Pain Descriptors / Indicators: Grimacing;Guarding Pain Intervention(s): Monitored during session;Premedicated before session;Repositioned     Hand Dominance Right   Extremity/Trunk Assessment Upper Extremity Assessment Upper Extremity  Assessment: Overall WFL for tasks assessed   Lower Extremity Assessment Lower Extremity Assessment: Overall WFL for tasks assessed   Cervical / Trunk Assessment Cervical / Trunk Assessment:  Kyphotic   Communication Communication Communication: No difficulties   Cognition Arousal/Alertness: Awake/alert Behavior During Therapy: WFL for tasks assessed/performed Overall Cognitive Status: Impaired/Different from baseline Area of Impairment: Safety/judgement                     Memory: Decreased short-term memory   Safety/Judgement: Decreased awareness of deficits Awareness: Emergent   General Comments: Cues for safety with RW and activity pacing. Patient understands extent of procedure and precautions.   General Comments  Significant bruising to scalp with incision open to room air. No active bleeding noted.    Exercises     Shoulder Instructions      Home Living Family/patient expects to be discharged to:: Private residence Living Arrangements: Spouse/significant other Available Help at Discharge: Family;Available 24 hours/day Type of Home: House Home Access: Ramped entrance     Home Layout: Laundry or work area in basement;Two level Alternate Level Stairs-Number of Steps: pt does not utilize basement   Bathroom Shower/Tub: Walk-in shower;Tub/shower unit   Bathroom Toilet: Standard     Home Equipment: Environmental consultant - 2 wheels;Walker - standard;Bedside commode;Shower seat;Grab bars - toilet;Grab bars - tub/shower (walking stick)          Prior Functioning/Environment Level of Independence: Needs assistance  Gait / Transfers Assistance Needed: Use of walking stick in home and community dwellings. Chronic back pain making long-distance ambulation difficult. ADL's / Homemaking Assistance Needed: PRN assist from wife 2/2 unsteadiness   Comments: pt reports ambulating household and limited community distances with use of walking stick        OT Problem List: Pain;Impaired balance (sitting and/or standing);Decreased safety awareness;Decreased knowledge of use of DME or AE      OT Treatment/Interventions:      OT Goals(Current goals can be found in the  care plan section) Acute Rehab OT Goals Patient Stated Goal: To return home. OT Goal Formulation: With patient Time For Goal Achievement: 09/07/20 Potential to Achieve Goals: Good  OT Frequency:     Barriers to D/C:            Co-evaluation              AM-PAC OT "6 Clicks" Daily Activity     Outcome Measure Help from another person eating meals?: None Help from another person taking care of personal grooming?: A Capaldi Help from another person toileting, which includes using toliet, bedpan, or urinal?: A Dickerson Help from another person bathing (including washing, rinsing, drying)?: A Hohman Help from another person to put on and taking off regular upper body clothing?: A Cassara Help from another person to put on and taking off regular lower body clothing?: A Severin 6 Click Score: 19   End of Session Equipment Utilized During Treatment: Gait belt;Rolling walker Nurse Communication: Mobility status  Activity Tolerance: Patient tolerated treatment well Patient left: in chair;with call bell/phone within reach;with chair alarm set;with family/visitor present  OT Visit Diagnosis: Unsteadiness on feet (R26.81);Pain Pain - part of body:  (Head and low back)                Time: 4580-9983 OT Time Calculation (min): 25 min Charges:  OT General Charges $OT Visit: 1 Visit OT Evaluation $OT Eval Moderate Complexity: 1 Mod OT Treatments $Self Care/Home Management : 8-22 mins  Krystyna Cleckley  H. OTR/L Supplemental OT, Department of rehab services [phone]  Mairany Bruno R H. 08/24/2020, 10:28 AM

## 2020-08-24 NOTE — Progress Notes (Signed)
Neurosurgery Service Progress Note  Subjective: No acute events overnight. 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 CTH and shunt series show catheter in position without complicating feature.  -discharge home today  Judith Part  08/24/20 8:35 AM

## 2020-08-24 NOTE — Evaluation (Signed)
Physical Therapy Evaluation Patient Details Name: Jeffrey Wheeler MRN: [id number] DOB: Jun 05, 1936 Today's Date: 08/24/2020   History of Present Illness  84 y.o. man with a history of progressive magnetic gait, urinary incontinence, and confusion suspicious for NPH. Imaging confirmed the presence of ventriculomegaly. He had a positive result to a high volume LP and underwent R VP shunt placement on 08/23/2020.  Clinical Impression  Pt presents to PT with deficits in functional mobility, gait, balance, endurance, and cognition. Pt currently requires UE support to improve stability during standing and gait activities. Pt with improved stability with BUE support of RW compared to that of walking stick. Pt demonstrates limited tolerance for ambulation at this time, but reports this to be baseline. Pt will benefit from continued acute PT POC as well as outpatient PT at the time of discharge to aide in improving stability in gait. Pt has no current DME needs, PT encourages the pt to utilize RW for all OOB activity upon return home.    Follow Up Recommendations Outpatient PT;Supervision/Assistance - 24 hour    Equipment Recommendations  None recommended by PT (pt owns necessary DME)    Recommendations for Other Services       Precautions / Restrictions Precautions Precautions: Fall Precaution Comments: HOB>30 degrees Restrictions Weight Bearing Restrictions: No      Mobility  Bed Mobility Overal bed mobility: Needs Assistance Bed Mobility: Supine to Sit     Supine to sit: Supervision          Transfers Overall transfer level: Needs assistance Equipment used: None;Rolling walker (2 wheeled) Transfers: Sit to/from Stand Sit to Stand: Min guard;Supervision         General transfer comment: minG without device, supervision with RW  Ambulation/Gait Ambulation/Gait assistance: Min guard;Supervision Gait Distance (Feet): 150 Feet (70' RW, 40' walking stick, 40' RW) Assistive  device: Rolling walker (2 wheeled) (walking stick) Gait Pattern/deviations: Step-to pattern;Wide base of support;Trunk flexed Gait velocity: reduced Gait velocity interpretation: <1.8 ft/sec, indicate of risk for recurrent falls General Gait Details: pt with short step-to gait, increased trunk flexion and widened BOS, no significant losses of balance noted. Pt demonstrates improved stability with BUE support of RW compared to use of walking stick  Stairs            Wheelchair Mobility    Modified Rankin (Stroke Patients Only)       Balance Overall balance assessment: Needs assistance Sitting-balance support: No upper extremity supported;Feet supported Sitting balance-Leahy Scale: Good Sitting balance - Comments: supervision to don undergarments at edge of bed   Standing balance support: Single extremity supported;Bilateral upper extremity supported Standing balance-Leahy Scale: Poor Standing balance comment: reliant on UE support of walking stick or RW                             Pertinent Vitals/Pain Pain Assessment: Faces Faces Pain Scale: Hurts Starling more Pain Location: abdomen Pain Descriptors / Indicators: Grimacing Pain Intervention(s): Monitored during session    Home Living Family/patient expects to be discharged to:: Private residence Living Arrangements: Spouse/significant other Available Help at Discharge: Family;Available 24 hours/day Type of Home: House Home Access: Ramped entrance     Home Layout: Laundry or work area in basement;Two level Home Equipment: Walker - 2 wheels;Walker - standard (walking stick)      Prior Function Level of Independence: Independent with assistive device(s)         Comments: pt reports ambulating household and  limited community distances with use of walking stick     Hand Dominance   Dominant Hand: Right    Extremity/Trunk Assessment   Upper Extremity Assessment Upper Extremity Assessment: Overall  WFL for tasks assessed    Lower Extremity Assessment Lower Extremity Assessment: Overall WFL for tasks assessed    Cervical / Trunk Assessment Cervical / Trunk Assessment: Kyphotic  Communication   Communication: No difficulties  Cognition Arousal/Alertness: Awake/alert Behavior During Therapy: WFL for tasks assessed/performed Overall Cognitive Status: Impaired/Different from baseline Area of Impairment: Memory;Safety/judgement;Awareness                     Memory: Decreased short-term memory   Safety/Judgement: Decreased awareness of deficits Awareness: Emergent          General Comments General comments (skin integrity, edema, etc.): VSS on RA, pt acknowledges some changes in cognitive function due to NPH    Exercises     Assessment/Plan    PT Assessment Patient needs continued PT services  PT Problem List Decreased activity tolerance;Decreased balance;Decreased mobility;Decreased cognition;Decreased knowledge of use of DME;Decreased safety awareness;Decreased knowledge of precautions       PT Treatment Interventions DME instruction;Gait training;Stair training;Functional mobility training;Therapeutic activities;Balance training;Cognitive remediation;Neuromuscular re-education;Patient/family education;Therapeutic exercise    PT Goals (Current goals can be found in the Care Plan section)  Acute Rehab PT Goals Patient Stated Goal: To go home PT Goal Formulation: With patient Time For Goal Achievement: 09/07/20 Potential to Achieve Goals: Good    Frequency Min 3X/week   Barriers to discharge        Co-evaluation               AM-PAC PT "6 Clicks" Mobility  Outcome Measure Help needed turning from your back to your side while in a flat bed without using bedrails?: A Gillies Help needed moving from lying on your back to sitting on the side of a flat bed without using bedrails?: A Bond Help needed moving to and from a bed to a chair (including a  wheelchair)?: A Dobek Help needed standing up from a chair using your arms (e.g., wheelchair or bedside chair)?: A Rahming Help needed to walk in hospital room?: A Swavely Help needed climbing 3-5 steps with a railing? : A Boeve 6 Click Score: 18    End of Session   Activity Tolerance: Patient tolerated treatment well Patient left: in chair;with call bell/phone within reach;with chair alarm set Nurse Communication: Mobility status PT Visit Diagnosis: Unsteadiness on feet (R26.81);Other abnormalities of gait and mobility (R26.89)    Time: 0350-0938 PT Time Calculation (min) (ACUTE ONLY): 31 min   Charges:   PT Evaluation $PT Eval Low Complexity: 1 Low PT Treatments $Gait Training: 8-22 mins        Zenaida Niece, PT, DPT Acute Rehabilitation Pager: [phone]   Zenaida Niece 08/24/2020, 9:13 AM

## 2020-09-05 DIAGNOSIS — R262 Difficulty in walking, not elsewhere classified: Secondary | ICD-10-CM | POA: Diagnosis not present

## 2020-09-08 DIAGNOSIS — R262 Difficulty in walking, not elsewhere classified: Secondary | ICD-10-CM | POA: Diagnosis not present

## 2020-09-11 DIAGNOSIS — R262 Difficulty in walking, not elsewhere classified: Secondary | ICD-10-CM | POA: Diagnosis not present

## 2020-09-13 DIAGNOSIS — R262 Difficulty in walking, not elsewhere classified: Secondary | ICD-10-CM | POA: Diagnosis not present

## 2020-09-20 DIAGNOSIS — R262 Difficulty in walking, not elsewhere classified: Secondary | ICD-10-CM | POA: Diagnosis not present

## 2020-09-22 DIAGNOSIS — R262 Difficulty in walking, not elsewhere classified: Secondary | ICD-10-CM | POA: Diagnosis not present

## 2020-09-25 DIAGNOSIS — R262 Difficulty in walking, not elsewhere classified: Secondary | ICD-10-CM | POA: Diagnosis not present

## 2020-09-27 DIAGNOSIS — R262 Difficulty in walking, not elsewhere classified: Secondary | ICD-10-CM | POA: Diagnosis not present

## 2020-10-02 DIAGNOSIS — L988 Other specified disorders of the skin and subcutaneous tissue: Secondary | ICD-10-CM | POA: Diagnosis not present

## 2020-10-02 DIAGNOSIS — L570 Actinic keratosis: Secondary | ICD-10-CM | POA: Diagnosis not present

## 2020-10-02 DIAGNOSIS — C44622 Squamous cell carcinoma of skin of right upper limb, including shoulder: Secondary | ICD-10-CM | POA: Diagnosis not present

## 2020-10-03 DIAGNOSIS — R262 Difficulty in walking, not elsewhere classified: Secondary | ICD-10-CM | POA: Diagnosis not present

## 2020-10-05 DIAGNOSIS — G912 (Idiopathic) normal pressure hydrocephalus: Secondary | ICD-10-CM | POA: Diagnosis not present

## 2020-10-05 DIAGNOSIS — M5481 Occipital neuralgia: Secondary | ICD-10-CM | POA: Diagnosis not present

## 2020-10-06 DIAGNOSIS — R262 Difficulty in walking, not elsewhere classified: Secondary | ICD-10-CM | POA: Diagnosis not present

## 2020-10-09 DIAGNOSIS — R262 Difficulty in walking, not elsewhere classified: Secondary | ICD-10-CM | POA: Diagnosis not present

## 2020-10-11 DIAGNOSIS — R262 Difficulty in walking, not elsewhere classified: Secondary | ICD-10-CM | POA: Diagnosis not present

## 2020-10-11 DIAGNOSIS — C44622 Squamous cell carcinoma of skin of right upper limb, including shoulder: Secondary | ICD-10-CM | POA: Diagnosis not present

## 2020-10-16 DIAGNOSIS — R262 Difficulty in walking, not elsewhere classified: Secondary | ICD-10-CM | POA: Diagnosis not present

## 2020-10-20 DIAGNOSIS — R262 Difficulty in walking, not elsewhere classified: Secondary | ICD-10-CM | POA: Diagnosis not present

## 2020-10-23 DIAGNOSIS — R262 Difficulty in walking, not elsewhere classified: Secondary | ICD-10-CM | POA: Diagnosis not present

## 2020-10-27 DIAGNOSIS — R262 Difficulty in walking, not elsewhere classified: Secondary | ICD-10-CM | POA: Diagnosis not present

## 2020-11-14 DIAGNOSIS — H6123 Impacted cerumen, bilateral: Secondary | ICD-10-CM | POA: Diagnosis not present

## 2020-11-16 ENCOUNTER — Other Ambulatory Visit: Payer: Self-pay | Admitting: Neurological Surgery

## 2020-11-16 ENCOUNTER — Ambulatory Visit
Admission: RE | Admit: 2020-11-16 | Discharge: 2020-11-16 | Disposition: A | Discharge status: Home / Self Care | Payer: Medicare PPO | Source: Ambulatory Visit | Attending: Neurological Surgery | Admitting: Neurological Surgery

## 2020-11-16 DIAGNOSIS — G912 (Idiopathic) normal pressure hydrocephalus: Secondary | ICD-10-CM

## 2020-11-16 DIAGNOSIS — G4486 Cervicogenic headache: Secondary | ICD-10-CM | POA: Diagnosis not present

## 2020-11-16 DIAGNOSIS — R519 Headache, unspecified: Secondary | ICD-10-CM | POA: Diagnosis not present

## 2020-11-16 DIAGNOSIS — I6782 Cerebral ischemia: Secondary | ICD-10-CM | POA: Diagnosis not present

## 2020-11-22 DIAGNOSIS — G912 (Idiopathic) normal pressure hydrocephalus: Secondary | ICD-10-CM | POA: Diagnosis not present

## 2020-11-23 DIAGNOSIS — N401 Enlarged prostate with lower urinary tract symptoms: Secondary | ICD-10-CM | POA: Diagnosis not present

## 2020-11-30 DIAGNOSIS — L570 Actinic keratosis: Secondary | ICD-10-CM | POA: Diagnosis not present

## 2020-12-05 ENCOUNTER — Ambulatory Visit: Payer: Medicare PPO | Admitting: Neurology

## 2020-12-05 ENCOUNTER — Encounter: Payer: Self-pay | Admitting: Neurology

## 2020-12-05 VITALS — BP 125/80 | HR 56 | Ht 69.0 in | Wt 226.0 lb

## 2020-12-05 DIAGNOSIS — R269 Unspecified abnormalities of gait and mobility: Secondary | ICD-10-CM

## 2020-12-05 DIAGNOSIS — G912 (Idiopathic) normal pressure hydrocephalus: Secondary | ICD-10-CM

## 2020-12-05 DIAGNOSIS — N3941 Urge incontinence: Secondary | ICD-10-CM

## 2020-12-05 DIAGNOSIS — G629 Polyneuropathy, unspecified: Secondary | ICD-10-CM

## 2020-12-05 NOTE — Progress Notes (Signed)
[facility]  PATIENT: Jeffrey Wheeler DOB: 02-14-1936  REFERRING DOCTOR OR PCP:   Asencion Noble, MD SOURCE: Patient, notes from primary care, imaging and lab reports, images personally reviewed.  _________________________________   HISTORICAL  CHIEF COMPLAINT:  Chief Complaint  Patient presents with  . Follow-up    Rm 13. Last seen 08/04/2020. Follow up for NPH. Saw neurosurgeon/had shunt placed 08/25/2020. Dr. Fransisca Kaufmann. Has had 3 post op appt. Next appt in about a week or so.    HISTORY OF PRESENT ILLNESS:   Jeffrey Wheeler is a 85 y.o. man with NPH s/p VP shunt 09/04/2021  Update 12/05/2020 He had the VP shunt 09/04/2021 and spent one night in the hospital.   He feels he is walking better.  He is taking bigger steps sand moving faster.    He still tires out easily so he does not walk far.   He no longer uses the cane.   He had only one fall after the shunt but several in the preceding 6 months.     He has had 2 adustments to the pressure  He does not note any bladder improvement and still has nocturia 3-6 times a night.   He does not have urinary retention.   He is on Myrbetriq .   He has BPH and had the urolift procedure.    His brain fog is better.   His wife notes improvement.     He only gets occasional headaches.   He takes a tramadol fo his LBP and it can help HA.     His occipital neuralgia pain improved.   Images and reports personally reviewed:  CT scan of the head 05/18/2020: There is ventriculomegaly out of proportion to the extent of atrophy, potentially worrisome for normal pressure hydrocephalus.  Additionally, there are extensive hypodense foci in the white matter consistent with moderate chronic microvascular ischemic change.  No acute findings.  MRI of the lumbar spine from 02/24/2020: There are severe degenerative changes at every level and also scoliosis.  There is edema with in the endplates at D1-V6.  Compared to the MRI from 02/06/2019 the  current MRI is essentially unchanged.  There is foraminal narrowing with potential for exiting nerve root compression to the right at L1-L2, bilaterally at L3-L4.  At L3-L4, there closely compression of the traversing L4 nerve root and at L4-L5 there can be compression of the traversing left L5 nerve root.  On the sagittal views, there also appears to be severe foraminal narrowing at T10-T11 to the left that could affect the T10 nerve root  REVIEW OF SYSTEMS: Constitutional: No fevers, chills, sweats, or change in appetite Eyes: No visual changes, double vision, eye pain Ear, nose and throat: No hearing loss, ear pain, nasal congestion, sore throat Cardiovascular: No chest pain, palpitations Respiratory: No shortness of breath at rest or with exertion.   No wheezes GastrointestinaI: No nausea, vomiting, diarrhea, abdominal pain, fecal incontinence Genitourinary: No dysuria, urinary retention or frequency.  No nocturia. Musculoskeletal: No neck pain, back pain Integumentary: No rash, pruritus, skin lesions Neurological: as above Psychiatric: No depression at this time.  No anxiety Endocrine: No palpitations, diaphoresis, change in appetite, change in weigh or increased thirst Hematologic/Lymphatic: No anemia, purpura, petechiae. Allergic/Immunologic: No itchy/runny eyes, nasal congestion, recent allergic reactions, rashes  ALLERGIES: No Known Allergies  HOME MEDICATIONS:  Current Outpatient Medications:  .  aspirin EC 81 MG tablet, Take 1 tablet (81 mg total) by mouth at bedtime. Restart  on 08/27/20, Disp: 30 tablet, Rfl: 11 .  docusate sodium (COLACE) 100 MG capsule, Take 300 mg by mouth at bedtime., Disp: , Rfl:  .  dorzolamide-timolol (COSOPT) 22.3-6.8 MG/ML ophthalmic solution, Place 1 drop into both eyes 2 (two) times daily. , Disp: , Rfl: 12 .  doxazosin (CARDURA) 8 MG tablet, Take 8 mg by mouth at bedtime. , Disp: , Rfl:  .  gabapentin (NEURONTIN) 300 MG capsule, Take 100 mg by  mouth at bedtime., Disp: , Rfl:  .  HYDROcodone-acetaminophen (NORCO/VICODIN) 5-325 MG tablet, Take 1 tablet by mouth every 4 (four) hours as needed for moderate pain., Disp: 30 tablet, Rfl: 0 .  latanoprost (XALATAN) 0.005 % ophthalmic solution, Place 1 drop into both eyes at bedtime., Disp: , Rfl:  .  mirabegron ER (MYRBETRIQ) 25 MG TB24 tablet, Take 25 mg by mouth at bedtime. , Disp: , Rfl:  .  pantoprazole (PROTONIX) 40 MG tablet, Take 40 mg by mouth 2 (two) times daily before a meal. , Disp: , Rfl:  .  valACYclovir (VALTREX) 1000 MG tablet, Take 1,000 mg by mouth at bedtime. , Disp: , Rfl:  .  vitamin B-12 (CYANOCOBALAMIN) 1000 MCG tablet, Take 1,000 mcg by mouth at bedtime., Disp: , Rfl:   PAST MEDICAL HISTORY: Past Medical History:  Diagnosis Date  . Back pain   . BPH (benign prostatic hyperplasia)   . Cancer ([place])    basal cell skin cancer  . Coxsackie viruses    age 89  . Gastroesophageal reflux disease   . Generalized hyperhidrosis   . GERD (gastroesophageal reflux disease)   . Glaucoma   . High cholesterol   . Osteoarthritis   . Pneumonia    aspiration pneumonia  . Primary localized osteoarthritis of right knee   . Prostate disease   . Scoliosis     PAST SURGICAL HISTORY: Past Surgical History:  Procedure Laterality Date  . APPENDECTOMY  1969  . EYE SURGERY     laser surgery for glaucoma, cataract surgery on both eyes  . KNEE ARTHROSCOPY W/ MENISCECTOMY Right   . TONSILLECTOMY    . TOTAL KNEE ARTHROPLASTY Right 09/12/2014   Procedure: RIGHT TOTAL KNEE ARTHROPLASTY;  Surgeon: Lorn Junes, MD;  Location: [facility];  Service: Orthopedics;  Laterality: Right;  . TUMOR EXCISION Right 40 yrs ago   calf  . VENTRICULOPERITONEAL SHUNT Right 08/23/2020   Procedure: Right ventriculoperitoneal shunt placement;  Surgeon: Judith Part, MD;  Location: [facility] [place];  Service: Neurosurgery;  Laterality: Right;    FAMILY HISTORY: Family History  Problem Relation Age of Onset   . Diabetes Other   . CAD Other   . Hypertension Other   . CVA Other   . Multiple myeloma Mother   . Cirrhosis Father     SOCIAL HISTORY:  Social History   Socioeconomic History  . Marital status: Married    Spouse name: Not on file  . Number of children: Not on file  . Years of education: Not on file  . Highest education level: Not on file  Occupational History  . Not on file  Tobacco Use  . Smoking status: Former Research scientist (life sciences)  . Smokeless tobacco: Former Systems developer    Quit date: 08/31/2000  Substance and Sexual Activity  . Alcohol use: Yes    Alcohol/week: 3.0 standard drinks    Types: 3 Shots of liquor per week    Comment: occasional  . Drug use: No  . Sexual activity: Not on file  Other Topics Concern  . Not on file  Social History Narrative  . Not on file   Social Determinants of Health   Financial Resource Strain: Not on file  Food Insecurity: Not on file  Transportation Needs: Not on file  Physical Activity: Not on file  Stress: Not on file  Social Connections: Not on file  Intimate Partner Violence: Not on file     PHYSICAL EXAM  Vitals:   12/05/20 1530  BP: 125/80  Pulse: (!) 56  SpO2: 97%  Weight: 226 lb (102.5 kg)  Height: 5' 9" (1.753 m)    Body mass index is 33.37 kg/m.   General: The patient is well-developed and well-nourished and in no acute distress  HEENT:  Head is [place]/AT.  Sclera are anicteric.    Neck: No carotid bruits are noted.  The neck is nontender.  Cardiovascular: The heart has a regular rate and rhythm with a normal S1 and S2. There were no murmurs, gallops or rubs.    Skin/Extremities: Extremities are without rash.  He has mild ankle edema.  Musculoskeletal:  Back is non-tender with reduced ROM  Neurologic Exam  Mental status: The patient is alert and oriented x 3 at the time of the examination. The patient has apparent normal recent and remote memory, with an apparently normal attention span and concentration ability.    Speech is normal.  Cranial nerves: Extraocular movements are full except 50% upgaze. . There is good facial sensation to soft touch bilaterally.Facial strength is normal.  Trapezius and sternocleidomastoid strength is normal. No dysarthria is noted.  The tongue is midline, and the patient has symmetric elevation of the soft palate. No obvious hearing deficits are noted.  Motor:  Muscle bulk is normal.   Tone is normal. Strength is  5 / 5 in all 4 extremities.   Sensory: Sensory testing is intact to pinprick, soft touch and vibration sensation in arms but reduced vibration sensation in feet  Coordination: Cerebellar testing reveals good finger-nose-finger and heel-to-shin bilaterally.  Gait and station: Station is wide.   Gait has a mildly reduced stride (improved).   He cannot tandem walk.   Turns 180 degrees in 4 steps (was 9).  Has less retropulsion.  Romberg is negative.   Reflexes: Deep tendon reflexes are symmetric and normal bilaterally.   Plantar responses are flexor.      ASSESSMENT AND PLAN  Normal pressure hydrocephalus (HCC)  Gait disturbance  Urge incontinence  Polyneuropathy  1.  Gait and cognition have improved since the VP shunt.  He has no falls.  Unfortunately the bladder function is still a problem for him. 2.   Stay active and exercise as tolerated. 3.   He can follow-up as needed if new or worsening neurologic symptoms.    A. Felecia Shelling, MD, [facility] 9/93/7169, 67:89 PM Certified in Neurology, Clinical Neurophysiology, Sleep Medicine and Neuroimaging  [facility] [address] [place], [place] [address] [phone]

## 2020-12-16 DIAGNOSIS — R112 Nausea with vomiting, unspecified: Secondary | ICD-10-CM | POA: Diagnosis not present

## 2020-12-16 DIAGNOSIS — K449 Diaphragmatic hernia without obstruction or gangrene: Secondary | ICD-10-CM | POA: Diagnosis not present

## 2020-12-16 DIAGNOSIS — R1012 Left upper quadrant pain: Secondary | ICD-10-CM | POA: Diagnosis not present

## 2020-12-16 DIAGNOSIS — R102 Pelvic and perineal pain: Secondary | ICD-10-CM | POA: Diagnosis not present

## 2020-12-16 DIAGNOSIS — K5730 Diverticulosis of large intestine without perforation or abscess without bleeding: Secondary | ICD-10-CM | POA: Diagnosis not present

## 2020-12-18 ENCOUNTER — Encounter (HOSPITAL_COMMUNITY): Payer: Self-pay

## 2020-12-18 ENCOUNTER — Inpatient Hospital Stay (HOSPITAL_COMMUNITY)
Admission: EM | Admit: 2020-12-18 | Discharge: 2020-12-24 | DRG: 418 | Disposition: A | Discharge status: Home Health | Payer: Medicare PPO | Source: Ambulatory Visit | Attending: Internal Medicine | Admitting: Internal Medicine

## 2020-12-18 ENCOUNTER — Emergency Department (HOSPITAL_COMMUNITY): Payer: Medicare PPO

## 2020-12-18 ENCOUNTER — Other Ambulatory Visit: Payer: Self-pay

## 2020-12-18 DIAGNOSIS — K5909 Other constipation: Secondary | ICD-10-CM | POA: Diagnosis present

## 2020-12-18 DIAGNOSIS — K828 Other specified diseases of gallbladder: Secondary | ICD-10-CM | POA: Diagnosis not present

## 2020-12-18 DIAGNOSIS — K219 Gastro-esophageal reflux disease without esophagitis: Secondary | ICD-10-CM | POA: Diagnosis present

## 2020-12-18 DIAGNOSIS — N400 Enlarged prostate without lower urinary tract symptoms: Secondary | ICD-10-CM | POA: Diagnosis present

## 2020-12-18 DIAGNOSIS — G8929 Other chronic pain: Secondary | ICD-10-CM | POA: Diagnosis present

## 2020-12-18 DIAGNOSIS — Z79899 Other long term (current) drug therapy: Secondary | ICD-10-CM

## 2020-12-18 DIAGNOSIS — Z23 Encounter for immunization: Secondary | ICD-10-CM | POA: Diagnosis not present

## 2020-12-18 DIAGNOSIS — K82A1 Gangrene of gallbladder in cholecystitis: Secondary | ICD-10-CM | POA: Diagnosis not present

## 2020-12-18 DIAGNOSIS — G912 (Idiopathic) normal pressure hydrocephalus: Secondary | ICD-10-CM | POA: Diagnosis not present

## 2020-12-18 DIAGNOSIS — K810 Acute cholecystitis: Secondary | ICD-10-CM | POA: Diagnosis present

## 2020-12-18 DIAGNOSIS — K7689 Other specified diseases of liver: Secondary | ICD-10-CM | POA: Diagnosis not present

## 2020-12-18 DIAGNOSIS — Z8701 Personal history of pneumonia (recurrent): Secondary | ICD-10-CM

## 2020-12-18 DIAGNOSIS — K8000 Calculus of gallbladder with acute cholecystitis without obstruction: Principal | ICD-10-CM | POA: Diagnosis present

## 2020-12-18 DIAGNOSIS — K8020 Calculus of gallbladder without cholecystitis without obstruction: Secondary | ICD-10-CM | POA: Diagnosis not present

## 2020-12-18 DIAGNOSIS — Z982 Presence of cerebrospinal fluid drainage device: Secondary | ICD-10-CM

## 2020-12-18 DIAGNOSIS — K829 Disease of gallbladder, unspecified: Secondary | ICD-10-CM

## 2020-12-18 DIAGNOSIS — E871 Hypo-osmolality and hyponatremia: Secondary | ICD-10-CM | POA: Diagnosis present

## 2020-12-18 DIAGNOSIS — K449 Diaphragmatic hernia without obstruction or gangrene: Secondary | ICD-10-CM | POA: Diagnosis present

## 2020-12-18 DIAGNOSIS — R451 Restlessness and agitation: Secondary | ICD-10-CM | POA: Diagnosis not present

## 2020-12-18 DIAGNOSIS — R100 Acute abdomen: Secondary | ICD-10-CM | POA: Diagnosis not present

## 2020-12-18 DIAGNOSIS — R109 Unspecified abdominal pain: Secondary | ICD-10-CM | POA: Diagnosis not present

## 2020-12-18 DIAGNOSIS — E876 Hypokalemia: Secondary | ICD-10-CM | POA: Diagnosis present

## 2020-12-18 DIAGNOSIS — Z20822 Contact with and (suspected) exposure to covid-19: Secondary | ICD-10-CM | POA: Diagnosis present

## 2020-12-18 DIAGNOSIS — K819 Cholecystitis, unspecified: Secondary | ICD-10-CM

## 2020-12-18 DIAGNOSIS — Z7982 Long term (current) use of aspirin: Secondary | ICD-10-CM | POA: Diagnosis not present

## 2020-12-18 DIAGNOSIS — Z85828 Personal history of other malignant neoplasm of skin: Secondary | ICD-10-CM | POA: Diagnosis not present

## 2020-12-18 DIAGNOSIS — Z87891 Personal history of nicotine dependence: Secondary | ICD-10-CM

## 2020-12-18 DIAGNOSIS — R410 Disorientation, unspecified: Secondary | ICD-10-CM | POA: Diagnosis not present

## 2020-12-18 DIAGNOSIS — E860 Dehydration: Secondary | ICD-10-CM | POA: Diagnosis not present

## 2020-12-18 DIAGNOSIS — H409 Unspecified glaucoma: Secondary | ICD-10-CM | POA: Diagnosis present

## 2020-12-18 DIAGNOSIS — M549 Dorsalgia, unspecified: Secondary | ICD-10-CM | POA: Diagnosis present

## 2020-12-18 DIAGNOSIS — M419 Scoliosis, unspecified: Secondary | ICD-10-CM | POA: Diagnosis present

## 2020-12-18 LAB — COMPREHENSIVE METABOLIC PANEL
ALT: 24 U/L (ref 0–44)
AST: 30 U/L (ref 15–41)
Albumin: 3.6 g/dL (ref 3.5–5.0)
Alkaline Phosphatase: 95 U/L (ref 38–126)
Anion gap: 10 (ref 5–15)
BUN: 18 mg/dL (ref 8–23)
CO2: 21 mmol/L — ABNORMAL LOW (ref 22–32)
Calcium: 9.1 mg/dL (ref 8.9–10.3)
Chloride: 97 mmol/L — ABNORMAL LOW (ref 98–111)
Creatinine, Ser: 1.02 mg/dL (ref 0.61–1.24)
GFR, Estimated: >60 mL/min (ref >60)
Glucose, Bld: 109 mg/dL — ABNORMAL HIGH (ref 70–99)
Potassium: 4 mmol/L (ref 3.5–5.1)
Sodium: 128 mmol/L — ABNORMAL LOW (ref 135–145)
Total Bilirubin: 1.4 mg/dL — ABNORMAL HIGH (ref 0.3–1.2)
Total Protein: 6.9 g/dL (ref 6.5–8.1)

## 2020-12-18 LAB — URINALYSIS, ROUTINE W REFLEX MICROSCOPIC
Bacteria, UA: NONE SEEN
Bilirubin Urine: NEGATIVE
Glucose, UA: NEGATIVE mg/dL
Ketones, ur: 5 mg/dL — AB
Leukocytes,Ua: NEGATIVE
Nitrite: NEGATIVE
Protein, ur: 100 mg/dL — AB
Specific Gravity, Urine: 1.034 — ABNORMAL HIGH (ref 1.005–1.030)
pH: 5 (ref 5.0–8.0)

## 2020-12-18 LAB — CBC WITH DIFFERENTIAL/PLATELET
Abs Immature Granulocytes: 0.28 10*3/uL — ABNORMAL HIGH (ref 0.00–0.07)
Basophils Absolute: 0.1 10*3/uL (ref 0.0–0.1)
Basophils Relative: 0 %
Eosinophils Absolute: 0.1 10*3/uL (ref 0.0–0.5)
Eosinophils Relative: 0 %
HCT: 40.2 % (ref 39.0–52.0)
Hemoglobin: 14.2 g/dL (ref 13.0–17.0)
Immature Granulocytes: 1 %
Lymphocytes Relative: 17 %
Lymphs Abs: 4.4 10*3/uL — ABNORMAL HIGH (ref 0.7–4.0)
MCH: 32.4 pg (ref 26.0–34.0)
MCHC: 35.3 g/dL (ref 30.0–36.0)
MCV: 91.8 fL (ref 80.0–100.0)
Monocytes Absolute: 1.3 10*3/uL — ABNORMAL HIGH (ref 0.1–1.0)
Monocytes Relative: 5 %
Neutro Abs: 19.2 10*3/uL — ABNORMAL HIGH (ref 1.7–7.7)
Neutrophils Relative %: 77 %
Platelets: 230 10*3/uL (ref 150–400)
RBC: 4.38 MIL/uL (ref 4.22–5.81)
RDW: 13.4 % (ref 11.5–15.5)
WBC: 25.3 10*3/uL — ABNORMAL HIGH (ref 4.0–10.5)
nRBC: 0 % (ref 0.0–0.2)

## 2020-12-18 LAB — LACTIC ACID, PLASMA: Lactic Acid, Venous: 1.7 mmol/L (ref 0.5–1.9)

## 2020-12-18 LAB — RESP PANEL BY RT-PCR (FLU A&B, COVID) ARPGX2
Influenza A by PCR: NEGATIVE
Influenza B by PCR: NEGATIVE
SARS Coronavirus 2 by RT PCR: NEGATIVE

## 2020-12-18 LAB — LIPASE, BLOOD: Lipase: 18 U/L (ref 11–51)

## 2020-12-18 MED ORDER — ONDANSETRON HCL 4 MG PO TABS: 4.0000 mg | ORAL_TABLET | Freq: Four times a day (QID) | ORAL | Status: DC | PRN

## 2020-12-18 MED ORDER — SODIUM CHLORIDE 0.9 % IV SOLN
2.0000 g | Freq: Once | INTRAVENOUS | Status: AC
  Administered 2020-12-18: 2 g via INTRAVENOUS
  Filled 2020-12-18: qty 20

## 2020-12-18 MED ORDER — ACETAMINOPHEN 325 MG PO TABS
650.0000 mg | ORAL_TABLET | Freq: Four times a day (QID) | ORAL | Status: DC | PRN
  Administered 2020-12-20 – 2020-12-23 (×3): 650 mg via ORAL
  Filled 2020-12-18 (×3): qty 2

## 2020-12-18 MED ORDER — SODIUM CHLORIDE 0.9 % IV SOLN
2.0000 g | INTRAVENOUS | Status: DC
  Administered 2020-12-19: 2 g via INTRAVENOUS
  Filled 2020-12-18: qty 20

## 2020-12-18 MED ORDER — VITAMIN B-12 1000 MCG PO TABS
1000.0000 ug | ORAL_TABLET | Freq: Every day | ORAL | Status: DC
  Administered 2020-12-18 – 2020-12-23 (×6): 1000 ug via ORAL
  Filled 2020-12-18 (×6): qty 1

## 2020-12-18 MED ORDER — IOHEXOL 300 MG/ML  SOLN
100.0000 mL | Freq: Once | INTRAMUSCULAR | Status: AC | PRN
  Administered 2020-12-18: 100 mL via INTRAVENOUS

## 2020-12-18 MED ORDER — GABAPENTIN 100 MG PO CAPS
100.0000 mg | ORAL_CAPSULE | Freq: Every day | ORAL | Status: DC
  Administered 2020-12-18 – 2020-12-23 (×6): 100 mg via ORAL
  Filled 2020-12-18 (×6): qty 1

## 2020-12-18 MED ORDER — ONDANSETRON HCL 4 MG/2ML IJ SOLN: 4.0000 mg | Freq: Four times a day (QID) | INTRAMUSCULAR | Status: DC | PRN

## 2020-12-18 MED ORDER — SODIUM CHLORIDE 0.9 % IV BOLUS
500.0000 mL | Freq: Once | INTRAVENOUS | Status: AC
  Administered 2020-12-18: 500 mL via INTRAVENOUS

## 2020-12-18 MED ORDER — MORPHINE SULFATE (PF) 2 MG/ML IV SOLN
2.0000 mg | INTRAVENOUS | Status: DC | PRN
Start: 2020-12-18 — End: 2020-12-24
  Administered 2020-12-20: 2 mg via INTRAVENOUS
  Filled 2020-12-18: qty 1

## 2020-12-18 MED ORDER — ONDANSETRON HCL 4 MG/2ML IJ SOLN
4.0000 mg | Freq: Once | INTRAMUSCULAR | Status: AC
  Administered 2020-12-18: 4 mg via INTRAVENOUS
  Filled 2020-12-18: qty 2

## 2020-12-18 MED ORDER — HEPARIN SODIUM (PORCINE) 5000 UNIT/ML IJ SOLN
5000.0000 [IU] | Freq: Three times a day (TID) | INTRAMUSCULAR | Status: DC
  Administered 2020-12-18 – 2020-12-24 (×16): 5000 [IU] via SUBCUTANEOUS
  Filled 2020-12-18 (×15): qty 1

## 2020-12-18 MED ORDER — HYDROCODONE-ACETAMINOPHEN 5-325 MG PO TABS: 1.0000 | ORAL_TABLET | ORAL | Status: DC | PRN

## 2020-12-18 MED ORDER — SODIUM CHLORIDE 0.9 % IV SOLN: INTRAVENOUS | Status: DC

## 2020-12-18 MED ORDER — ACETAMINOPHEN 650 MG RE SUPP: 650.0000 mg | Freq: Four times a day (QID) | RECTAL | Status: DC | PRN

## 2020-12-18 MED ORDER — PANTOPRAZOLE SODIUM 40 MG IV SOLR
40.0000 mg | INTRAVENOUS | Status: DC
  Administered 2020-12-18 – 2020-12-23 (×6): 40 mg via INTRAVENOUS
  Filled 2020-12-18 (×6): qty 40

## 2020-12-18 NOTE — ED Triage Notes (Signed)
Stomach pain, and no BM, last BM was 4/2 was diarrhea, pt says that not right.  Alert oriented skin warm and dry,

## 2020-12-18 NOTE — H&P (Signed)
TRH H&P    Patient Demographics:    Jeffrey Wheeler, is a 85 y.o. male  MRN: [id number]  DOB - 09-Jun-1936  Admit Date - 12/18/2020  Referring MD/NP/PA: Marcene Brawn  Outpatient Primary MD for the patient is Asencion Noble, MD  Patient coming from: Home  Chief complaint- Abdominal pain   HPI:    Jeffrey Wheeler  is a 85 y.o. 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 Patient reports that the pain started 4 days ago. It is periumbilical and sharp. The pain has been mostly constant with only an hour or two of waning pain over the last several days. The pain peaked on Saturday night after taking two dulcolax in the afternoon. Patient went to the [facility] in [place]. CT abdomen was done - which was negative, and then patient was sent home. He reports that one Sunday the pain let up briefly, and then came on just as strong. When the pain is there it is severe. Today, he was mostly nauseous, with no episodes of vomiting. He went to see his PCP, Dr. Willey Blade, who advised him to come back to the ER. Patient reports that his last normal meal was 5 days ago, with only clear liquids and crackers since then. He has noticed no change in the pain related to food. Patient reports that his last normal BM 1 week ago.  Patient reports that he usually stays constipated and has to take wheat bran regularly. Wife reports that he has always had "sluggish bowels." Patient is still having flatulence. Patient has chronic chest burning pain associated with GERD. He has never had CAD or seen a cardiologist. Patient has no other complaints at this time.   Doesn't smoke, drink, or use illicit drugs. Vaccinated for covid. Full Code.  In the ED T 98.2, HR 72 - 102, R 18-31 BP 158/96 maintaining sats on room air CT  abdomen - acute cholecystitis, BL pleaural effusions, and small hiatal hernia Rocephin, Zofran, and 500 ml bolus in the ED WBC 25 T Bili 1.4 Lipase 18     Review of systems:    In addition to the HPI above,  Subjective fever No Headache, No changes with Vision or hearing, No problems swallowing food or Liquids, Burning chest pain, no Cough or Shortness of Breath, Abdominal pain, Nausea, no vomiting, and constipation No Blood in stool or Urine, No dysuria, No new skin rashes or bruises, No new joints pains-aches,  No new weakness, tingling, numbness in any extremity, No recent weight gain or loss, No polyuria, polydypsia or polyphagia, No significant Mental Stressors.  All other systems reviewed and are negative.    Past History of the following :    Past Medical History:  Diagnosis Date  . Back pain   . BPH (benign prostatic hyperplasia)   . Cancer ([place])    basal cell skin cancer  . Coxsackie viruses  age 34  . Gastroesophageal reflux disease   . Generalized hyperhidrosis   . GERD (gastroesophageal reflux disease)   . Glaucoma   . High cholesterol   . Osteoarthritis   . Pneumonia    aspiration pneumonia  . Primary localized osteoarthritis of right knee   . Prostate disease   . Scoliosis       Past Surgical History:  Procedure Laterality Date  . APPENDECTOMY  1969  . EYE SURGERY     laser surgery for glaucoma, cataract surgery on both eyes  . KNEE ARTHROSCOPY W/ MENISCECTOMY Right   . TONSILLECTOMY    . TOTAL KNEE ARTHROPLASTY Right 09/12/2014   Procedure: RIGHT TOTAL KNEE ARTHROPLASTY;  Surgeon: Lorn Junes, MD;  Location: [facility];  Service: Orthopedics;  Laterality: Right;  . TUMOR EXCISION Right 40 yrs ago   calf  . VENTRICULOPERITONEAL SHUNT Right 08/23/2020   Procedure: Right ventriculoperitoneal shunt placement;  Surgeon: Judith Part, MD;  Location: [facility];  Service: Neurosurgery;  Laterality: Right;      Social History:       Social History   Tobacco Use  . Smoking status: Former Research scientist (life sciences)  . Smokeless tobacco: Former Systems developer    Quit date: 08/31/2000  Substance Use Topics  . Alcohol use: Yes    Alcohol/week: 3.0 standard drinks    Types: 3 Shots of liquor per week    Comment: occasional       Family History :     Family History  Problem Relation Age of Onset  . Diabetes Other   . CAD Other   . Hypertension Other   . CVA Other   . Multiple myeloma Mother   . Cirrhosis Father       Home Medications:   Prior to Admission medications   Medication Sig Start Date End Date Taking? Authorizing Provider  aspirin EC 81 MG tablet Take 1 tablet (81 mg total) by mouth at bedtime. Restart on 08/27/20 08/24/20   Judith Part, MD  docusate sodium (COLACE) 100 MG capsule Take 300 mg by mouth at bedtime.    [provider]  dorzolamide-timolol (COSOPT) 22.3-6.8 MG/ML ophthalmic solution Place 1 drop into both eyes 2 (two) times daily.  07/11/14   [provider]  doxazosin (CARDURA) 8 MG tablet Take 8 mg by mouth at bedtime.     [provider]  gabapentin (NEURONTIN) 300 MG capsule Take 100 mg by mouth at bedtime.    [provider]  HYDROcodone-acetaminophen (NORCO/VICODIN) 5-325 MG tablet Take 1 tablet by mouth every 4 (four) hours as needed for moderate pain. 08/24/20   Judith Part, MD  latanoprost (XALATAN) 0.005 % ophthalmic solution Place 1 drop into both eyes at bedtime.    [provider]  mirabegron ER (MYRBETRIQ) 25 MG TB24 tablet Take 25 mg by mouth at bedtime.     [provider]  pantoprazole (PROTONIX) 40 MG tablet Take 40 mg by mouth 2 (two) times daily before a meal.     [provider]  valACYclovir (VALTREX) 1000 MG tablet Take 1,000 mg by mouth at bedtime.  07/11/20   [provider]  vitamin B-12 (CYANOCOBALAMIN) 1000 MCG tablet Take 1,000 mcg by mouth at bedtime.    [provider]     Allergies:     No Known Allergies   Physical Exam:   Vitals  Blood pressure (!) 158/96, pulse 91, temperature 98.2 F (36.8 C), temperature source Oral, resp. rate Marland Kitchen)  25, height $RemoveBe'5\' 10"'dtoFbAsSj$  (1.778 m), weight 99.8 kg, SpO2 96 %.  1.  General:  laying supine in bed, no acute distress  2. Psychiatric: Alert, oriented x 3, flat affect, cooperative with exam  3. Neurologic: CN II-XII grossly intact, moves all 4 extremities voluntarily, oriented x 3, no focal deficit on limited exam  4. HEENMT:  Head is atraumatic, small deformity on right skull from VP shunt placement, pupils reactive, neck supple, trachea midline  5. Respiratory : Lungs clear to auscultation BL, no wheezes, crackles, rales, clubbing, or cyanosis.   6. Cardiovascular : Heart rate is normal, rhythm is regular, murmur present, no gallops, rubs Peripheral pulses palpated, no peripheral edema  7. Gastrointestinal:  Abdomen is distended, diffusely tender, ventral scar from VP placement - healing well, no palpable masses  8. Skin:  Skin is warm, dry, intact, without rashes or acute lesions on limited exam  9.Musculoskeletal:  No acute deformities, no calf tenderness, peripheral pulses palpated    Data Review:    CBC Recent Labs  Lab 12/18/20 1615  WBC 25.3*  HGB 14.2  HCT 40.2  PLT 230  MCV 91.8  MCH 32.4  MCHC 35.3  RDW 13.4  LYMPHSABS 4.4*  MONOABS 1.3*  EOSABS 0.1  BASOSABS 0.1   ------------------------------------------------------------------------------------------------------------------  Results for orders placed or performed during the hospital encounter of 12/18/20 (from the past 48 hour(s))  CBC with Differential/Platelet     Status: Abnormal   Collection Time: 12/18/20  4:15 PM  Result Value Ref Range   WBC 25.3 (H) 4.0 - 10.5 K/uL   RBC 4.38 4.22 - 5.81 MIL/uL   Hemoglobin 14.2 13.0 - 17.0 g/dL   HCT 40.2 39.0 - 52.0 %   MCV 91.8 80.0 - 100.0 fL   MCH 32.4 26.0 - 34.0 pg   MCHC 35.3 30.0 - 36.0  g/dL   RDW 13.4 11.5 - 15.5 %   Platelets 230 150 - 400 K/uL   nRBC 0.0 0.0 - 0.2 %   Neutrophils Relative % 77 %   Neutro Abs 19.2 (H) 1.7 - 7.7 K/uL   Lymphocytes Relative 17 %   Lymphs Abs 4.4 (H) 0.7 - 4.0 K/uL   Monocytes Relative 5 %   Monocytes Absolute 1.3 (H) 0.1 - 1.0 K/uL   Eosinophils Relative 0 %   Eosinophils Absolute 0.1 0.0 - 0.5 K/uL   Basophils Relative 0 %   Basophils Absolute 0.1 0.0 - 0.1 K/uL   Immature Granulocytes 1 %   Abs Immature Granulocytes 0.28 (H) 0.00 - 0.07 K/uL    Comment: Performed at [facility], [address] [place], [place] [address]  Comprehensive metabolic panel     Status: Abnormal   Collection Time: 12/18/20  4:15 PM  Result Value Ref Range   Sodium 128 (L) 135 - 145 mmol/L   Potassium 4.0 3.5 - 5.1 mmol/L   Chloride 97 (L) 98 - 111 mmol/L   CO2 21 (L) 22 - 32 mmol/L   Glucose, Bld 109 (H) 70 - 99 mg/dL    Comment: Glucose reference range applies only to samples taken after fasting for at least 8 hours.   BUN 18 8 - 23 mg/dL   Creatinine, Ser 1.02 0.61 - 1.24 mg/dL   Calcium 9.1 8.9 - 10.3 mg/dL   Total Protein 6.9 6.5 - 8.1 g/dL   Albumin 3.6 3.5 - 5.0 g/dL   AST 30 15 - 41 U/L   ALT 24 0 - 44 U/L  Alkaline Phosphatase 95 38 - 126 U/L   Total Bilirubin 1.4 (H) 0.3 - 1.2 mg/dL   GFR, Estimated >60 >60 mL/min    Comment: (NOTE) Calculated using the CKD-EPI Creatinine Equation (2021)    Anion gap 10 5 - 15    Comment: Performed at [facility], [address], [place], [place] [address]  Lipase, blood     Status: None   Collection Time: 12/18/20  4:15 PM  Result Value Ref Range   Lipase 18 11 - 51 U/L    Comment: Performed at [facility], [address], [place], [place] [address]  Urinalysis, Routine w reflex microscopic Urine, Clean Catch     Status: Abnormal   Collection Time: 12/18/20  4:42 PM  Result Value Ref Range   Color, Urine AMBER (A) YELLOW    Comment: BIOCHEMICALS MAY BE AFFECTED BY COLOR   APPearance HAZY (A)  CLEAR   Specific Gravity, Urine 1.034 (H) 1.005 - 1.030   pH 5.0 5.0 - 8.0   Glucose, UA NEGATIVE NEGATIVE mg/dL   Hgb urine dipstick SMALL (A) NEGATIVE   Bilirubin Urine NEGATIVE NEGATIVE   Ketones, ur 5 (A) NEGATIVE mg/dL   Protein, ur 100 (A) NEGATIVE mg/dL   Nitrite NEGATIVE NEGATIVE   Leukocytes,Ua NEGATIVE NEGATIVE   RBC / HPF 0-5 0 - 5 RBC/hpf   WBC, UA 0-5 0 - 5 WBC/hpf   Bacteria, UA NONE SEEN NONE SEEN   Squamous Epithelial / LPF 0-5 0 - 5   Mucus PRESENT     Comment: Performed at [facility], [address], [place],  [address]    Chemistries  Recent Labs  Lab 12/18/20 1615  NA 128*  K 4.0  CL 97*  CO2 21*  GLUCOSE 109*  BUN 18  CREATININE 1.02  CALCIUM 9.1  AST 30  ALT 24  ALKPHOS 95  BILITOT 1.4*   ------------------------------------------------------------------------------------------------------------------  ------------------------------------------------------------------------------------------------------------------ GFR: Estimated Creatinine Clearance: 66.1 mL/min (by C-G formula based on SCr of 1.02 mg/dL). Liver Function Tests: Recent Labs  Lab 12/18/20 1615  AST 30  ALT 24  ALKPHOS 95  BILITOT 1.4*  PROT 6.9  ALBUMIN 3.6   Recent Labs  Lab 12/18/20 1615  LIPASE 18   No results for input(s): AMMONIA in the last 168 hours. Coagulation Profile: No results for input(s): INR, PROTIME in the last 168 hours. Cardiac Enzymes: No results for input(s): CKTOTAL, CKMB, CKMBINDEX, TROPONINI in the last 168 hours. BNP (last 3 results) No results for input(s): PROBNP in the last 8760 hours. HbA1C: No results for input(s): HGBA1C in the last 72 hours. CBG: No results for input(s): GLUCAP in the last 168 hours. Lipid Profile: No results for input(s): CHOL, HDL, LDLCALC, TRIG, CHOLHDL, LDLDIRECT in the last 72 hours. Thyroid Function Tests: No results for input(s): TSH, T4TOTAL, FREET4, T3FREE, THYROIDAB in the last 72 hours. Anemia  Panel: No results for input(s): VITAMINB12, FOLATE, FERRITIN, TIBC, IRON, RETICCTPCT in the last 72 hours.  --------------------------------------------------------------------------------------------------------------- Urine analysis:    Component Value Date/Time   COLORURINE AMBER (A) 12/18/2020 1642   APPEARANCEUR HAZY (A) 12/18/2020 1642   LABSPEC 1.034 (H) 12/18/2020 1642   PHURINE 5.0 12/18/2020 1642   GLUCOSEU NEGATIVE 12/18/2020 1642   HGBUR SMALL (A) 12/18/2020 1642   BILIRUBINUR NEGATIVE 12/18/2020 1642   KETONESUR 5 (A) 12/18/2020 1642   PROTEINUR 100 (A) 12/18/2020 1642   UROBILINOGEN 0.2 09/14/2014 1056   NITRITE NEGATIVE 12/18/2020 1642   LEUKOCYTESUR NEGATIVE 12/18/2020 1642  Imaging Results:    CT ABDOMEN PELVIS W CONTRAST  Result Date: 12/18/2020 CLINICAL DATA:  Abdominal pain EXAM: CT ABDOMEN AND PELVIS WITH CONTRAST TECHNIQUE: Multidetector CT imaging of the abdomen and pelvis was performed using the standard protocol following bolus administration of intravenous contrast. CONTRAST:  127mL OMNIPAQUE IOHEXOL 300 MG/ML  SOLN COMPARISON:  None. FINDINGS: Lower chest: Trace bilateral pleural effusions. Bibasilar atelectasis. Heart is normal size. Small hiatal hernia. Hepatobiliary: Stranding noted around the gallbladder. Layering gallstone noted in the gallbladder near the gallbladder neck. Findings concerning for possible acute cholecystitis. Small cyst in the right hepatic dome. No suspicious hepatic abnormality. Pancreas: No focal abnormality or ductal dilatation. Spleen: No focal abnormality.  Normal size. Adrenals/Urinary Tract: Nodule in the right adrenal gland measures 1.5 cm. Left adrenal gland and kidneys unremarkable. Urinary bladder unremarkable. Stomach/Bowel: Stomach, large and small bowel grossly unremarkable. Vascular/Lymphatic: Aortic atherosclerosis. No evidence of aneurysm or adenopathy. Reproductive: Radiation. Seeds in the region of the prostate which  is mildly prominent Other: No free fluid or free air. Musculoskeletal: No acute and degenerative bony abnormality changes in the lumbar spine. Scoliosis IMPRESSION: Cholelithiasis. Stranding noted around the gallbladder concerning for acute cholecystitis. Trace bilateral pleural effusions.  Bibasilar atelectasis. Small hiatal hernia. Aortic atherosclerosis. Electronically Signed   By: Rolm Baptise M.D.   On: 12/18/2020 19:09      Assessment & Plan:    Principal Problem:   Acute cholecystitis Active Problems:   Gastroesophageal reflux disease   Hyponatremia   1. Acute cholecystitis 1. Seen on CT 2. Leukocytosis 25.3 3. T 98.2, HR 102, RR 31, WBC 25.3 - no evidence of end organ damage so far, lactic acid pending 4. Rocephin rec'd by Gen surg - continue rocephin 5. Gen surg to see in the AM 2. Hyponatremia 1. Na 128 2. Most likely 2/2 poor PO intake 3. 530ml bolus in ED 4. Continue gently hydration 5. Trend in the AM 3. GERD 1. Continue protonix 4.    DVT Prophylaxis-   heparin - SCDs   AM Labs Ordered, also please review Full Orders  Family Communication: Admission, patients condition and plan of care including tests being ordered have been discussed with the patient and wife who indicate understanding and agree with the plan and Code Status.  Wife can be reached at [phone]  Code Status:  Full  Admission status: Inpatient :The appropriate admission status for this patient is INPATIENT. Inpatient status is judged to be reasonable and necessary in order to provide the required intensity of service to ensure the patient's safety. The patient's presenting symptoms, physical exam findings, and initial radiographic and laboratory data in the context of their chronic comorbidities is felt to place them at high risk for further clinical deterioration. Furthermore, it is not anticipated that the patient will be medically stable for discharge from the hospital within 2 midnights of  admission. The following factors support the admission status of inpatient.     The patient's presenting symptoms include abdominal pain The worrisome physical exam findings include Abdominal distention and tenderness The initial radiographic and laboratory data are worrisome because of Acute cholecystitis on CT, leukocytosis The chronic co-morbidities include normal pressure hydrocephalus with VP shunt, GERD, Back pain       * I certify that at the point of admission it is my clinical judgment that the patient will require inpatient hospital care spanning beyond 2 midnights from the point of admission due to high intensity of service, high risk for further  deterioration and high frequency of surveillance required.*  Time spent in minutes :[place]

## 2020-12-18 NOTE — ED Notes (Signed)
Pt to room 329 staff at bedside

## 2020-12-18 NOTE — ED Provider Notes (Signed)
[facility] EMERGENCY DEPARTMENT Provider Note   CSN: [id number] Arrival date & time: 12/18/20  1524     History Chief Complaint  Patient presents with  . Nausea    Jeffrey Wheeler is a 85 y.o. male.  The history is provided by the patient. No language interpreter was used.  Abdominal Pain Pain location:  Generalized Pain quality: aching and fullness   Pain radiates to:  Does not radiate Pain severity:  Severe Onset quality:  Gradual Duration:  1 week Timing:  Constant Progression:  Worsening Chronicity:  New Context: not alcohol use   Relieved by:  Nothing Worsened by:  Nothing Ineffective treatments:  None tried Associated symptoms: no cough and no vomiting   Risk factors: multiple surgeries    Pt reports abdominal pain was severe 3 days ago.  Pt reports pain getting worse today    Past Medical History:  Diagnosis Date  . Back pain   . BPH (benign prostatic hyperplasia)   . Cancer (HCC)    basal cell skin cancer  . Coxsackie viruses    age 74  . Gastroesophageal reflux disease   . Generalized hyperhidrosis   . GERD (gastroesophageal reflux disease)   . Glaucoma   . High cholesterol   . Osteoarthritis   . Pneumonia    aspiration pneumonia  . Primary localized osteoarthritis of right knee   . Prostate disease   . Scoliosis     Patient Active Problem List   Diagnosis Date Noted  . Normal pressure hydrocephalus (HCC) 08/04/2020  . Polyneuropathy 08/04/2020  . Gait disturbance 08/04/2020  . Urge incontinence 08/04/2020  . Abdominal pain, left lower quadrant 09/13/2014  . Abdominal fullness in left flank 09/13/2014  . 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 DJD (degenerative joint disease) of knee 09/12/2014  . Primary localized osteoarthritis of right knee   . Glaucoma   . Prostate disease   . Gastroesophageal reflux disease   . Back pain     Past Surgical History:  Procedure Laterality Date  . APPENDECTOMY  1969  . EYE SURGERY     laser surgery for  glaucoma, cataract surgery on both eyes  . KNEE ARTHROSCOPY W/ MENISCECTOMY Right   . TONSILLECTOMY    . TOTAL KNEE ARTHROPLASTY Right 09/12/2014   Procedure: RIGHT TOTAL KNEE ARTHROPLASTY;  Surgeon: Nilda Simmer, MD;  Location: [facility] OR;  Service: Orthopedics;  Laterality: Right;  . TUMOR EXCISION Right 40 yrs ago   calf  . VENTRICULOPERITONEAL SHUNT Right 08/23/2020   Procedure: Right ventriculoperitoneal shunt placement;  Surgeon: Jadene Pierini, MD;  Location: [facility] OR;  Service: Neurosurgery;  Laterality: Right;       Family History  Problem Relation Age of Onset  . Diabetes Other   . CAD Other   . Hypertension Other   . CVA Other   . Multiple myeloma Mother   . Cirrhosis Father     Social History   Tobacco Use  . Smoking status: Former Games developer  . Smokeless tobacco: Former Neurosurgeon    Quit date: 08/31/2000  Substance Use Topics  . Alcohol use: Yes    Alcohol/week: 3.0 standard drinks    Types: 3 Shots of liquor per week    Comment: occasional  . Drug use: No    Home Medications Prior to Admission medications   Medication Sig Start Date End Date Taking? Authorizing Provider  aspirin EC 81 MG tablet Take 1 tablet (81 mg total) by mouth at bedtime. Restart on 08/27/20 08/24/20  Judith Part, MD  docusate sodium (COLACE) 100 MG capsule Take 300 mg by mouth at bedtime.    [provider]  dorzolamide-timolol (COSOPT) 22.3-6.8 MG/ML ophthalmic solution Place 1 drop into both eyes 2 (two) times daily.  07/11/14   [provider]  doxazosin (CARDURA) 8 MG tablet Take 8 mg by mouth at bedtime.     [provider]  gabapentin (NEURONTIN) 300 MG capsule Take 100 mg by mouth at bedtime.    [provider]  HYDROcodone-acetaminophen (NORCO/VICODIN) 5-325 MG tablet Take 1 tablet by mouth every 4 (four) hours as needed for moderate pain. 08/24/20   Judith Part, MD  latanoprost (XALATAN) 0.005 % ophthalmic solution Place 1 drop into  both eyes at bedtime.    [provider]  mirabegron ER (MYRBETRIQ) 25 MG TB24 tablet Take 25 mg by mouth at bedtime.     [provider]  pantoprazole (PROTONIX) 40 MG tablet Take 40 mg by mouth 2 (two) times daily before a meal.     [provider]  valACYclovir (VALTREX) 1000 MG tablet Take 1,000 mg by mouth at bedtime.  07/11/20   [provider]  vitamin B-12 (CYANOCOBALAMIN) 1000 MCG tablet Take 1,000 mcg by mouth at bedtime.    [provider]    Allergies    Patient has no known allergies.  Review of Systems   Review of Systems  Respiratory: Negative for cough.   Gastrointestinal: Positive for abdominal pain. Negative for vomiting.  All other systems reviewed and are negative.   Physical Exam Updated Vital Signs BP (!) 158/96   Pulse 91   Temp 98.2 F (36.8 C) (Oral)   Resp (!) 25   Ht $R'5\' 10"'rD$  (1.778 m)   Wt 99.8 kg   SpO2 96%   BMI 31.57 kg/m   Physical Exam Vitals and nursing note reviewed.  Constitutional:      Appearance: He is well-developed.  HENT:     Head: Normocephalic and atraumatic.  Eyes:     Conjunctiva/sclera: Conjunctivae normal.  Cardiovascular:     Rate and Rhythm: Normal rate and regular rhythm.     Heart sounds: No murmur heard.   Pulmonary:     Effort: Pulmonary effort is normal. No respiratory distress.     Breath sounds: Normal breath sounds.  Abdominal:     Palpations: Abdomen is soft.     Tenderness: There is abdominal tenderness.  Musculoskeletal:        General: Normal range of motion.     Cervical back: Neck supple.  Skin:    General: Skin is warm and dry.  Neurological:     General: No focal deficit present.     Mental Status: He is alert.  Psychiatric:        Mood and Affect: Mood normal.     ED Results / Procedures / Treatments   Labs (all labs ordered are listed, but only abnormal results are displayed) Labs Reviewed  CBC WITH DIFFERENTIAL/PLATELET - Abnormal; Notable  for the following components:      Result Value   WBC 25.3 (*)    Neutro Abs 19.2 (*)    Lymphs Abs 4.4 (*)    Monocytes Absolute 1.3 (*)    Abs Immature Granulocytes 0.28 (*)    All other components within normal limits  COMPREHENSIVE METABOLIC PANEL - Abnormal; Notable for the following components:   Sodium 128 (*)    Chloride 97 (*)  CO2 21 (*)    Glucose, Bld 109 (*)    Total Bilirubin 1.4 (*)    All other components within normal limits  URINALYSIS, ROUTINE W REFLEX MICROSCOPIC - Abnormal; Notable for the following components:   Color, Urine AMBER (*)    APPearance HAZY (*)    Specific Gravity, Urine 1.034 (*)    Hgb urine dipstick SMALL (*)    Ketones, ur 5 (*)    Protein, ur 100 (*)    All other components within normal limits  LIPASE, BLOOD    EKG None  Radiology CT ABDOMEN PELVIS W CONTRAST  Result Date: 12/18/2020 CLINICAL DATA:  Abdominal pain EXAM: CT ABDOMEN AND PELVIS WITH CONTRAST TECHNIQUE: Multidetector CT imaging of the abdomen and pelvis was performed using the standard protocol following bolus administration of intravenous contrast. CONTRAST:  149mL OMNIPAQUE IOHEXOL 300 MG/ML  SOLN COMPARISON:  None. FINDINGS: Lower chest: Trace bilateral pleural effusions. Bibasilar atelectasis. Heart is normal size. Small hiatal hernia. Hepatobiliary: Stranding noted around the gallbladder. Layering gallstone noted in the gallbladder near the gallbladder neck. Findings concerning for possible acute cholecystitis. Small cyst in the right hepatic dome. No suspicious hepatic abnormality. Pancreas: No focal abnormality or ductal dilatation. Spleen: No focal abnormality.  Normal size. Adrenals/Urinary Tract: Nodule in the right adrenal gland measures 1.5 cm. Left adrenal gland and kidneys unremarkable. Urinary bladder unremarkable. Stomach/Bowel: Stomach, large and small bowel grossly unremarkable. Vascular/Lymphatic: Aortic atherosclerosis. No evidence of aneurysm or adenopathy.  Reproductive: Radiation. Seeds in the region of the prostate which is mildly prominent Other: No free fluid or free air. Musculoskeletal: No acute and degenerative bony abnormality changes in the lumbar spine. Scoliosis IMPRESSION: Cholelithiasis. Stranding noted around the gallbladder concerning for acute cholecystitis. Trace bilateral pleural effusions.  Bibasilar atelectasis. Small hiatal hernia. Aortic atherosclerosis. Electronically Signed   By: Rolm Baptise M.D.   On: 12/18/2020 19:09    Procedures Procedures   Medications Ordered in ED Medications  sodium chloride 0.9 % bolus 500 mL (0 mLs Intravenous Stopped 12/18/20 1847)  ondansetron (ZOFRAN) injection 4 mg (4 mg Intravenous Given 12/18/20 1817)  iohexol (OMNIPAQUE) 300 MG/ML solution 100 mL (100 mLs Intravenous Contrast Given 12/18/20 1849)    ED Course  I have reviewed the triage vital signs and the nursing notes.  Pertinent labs & imaging results that were available during my care of the patient were reviewed by me and considered in my medical decision making (see chart for details).    MDM Rules/Calculators/A&P                          MDM:  Ct scan shows multiple gallstones.  Pt has elevated wbc count.  I spoke to Dr. Constance Haw who advised have Hospitalist admit and obtain ultrasound in am  I spoke with Hospitalist who will admit Final Clinical Impression(s) / ED Diagnoses Final diagnoses:  Gallbladder disease    Rx / DC Orders ED Discharge Orders    None       Sidney Ace 12/18/20 2040    Isla Pence, MD 12/19/20 2253

## 2020-12-19 ENCOUNTER — Inpatient Hospital Stay (HOSPITAL_COMMUNITY): Payer: Medicare PPO

## 2020-12-19 DIAGNOSIS — K810 Acute cholecystitis: Secondary | ICD-10-CM | POA: Diagnosis not present

## 2020-12-19 DIAGNOSIS — E871 Hypo-osmolality and hyponatremia: Secondary | ICD-10-CM

## 2020-12-19 LAB — COMPREHENSIVE METABOLIC PANEL
ALT: 19 U/L (ref 0–44)
AST: 21 U/L (ref 15–41)
Albumin: 3 g/dL — ABNORMAL LOW (ref 3.5–5.0)
Alkaline Phosphatase: 91 U/L (ref 38–126)
Anion gap: 9 (ref 5–15)
BUN: 14 mg/dL (ref 8–23)
CO2: 21 mmol/L — ABNORMAL LOW (ref 22–32)
Calcium: 8.6 mg/dL — ABNORMAL LOW (ref 8.9–10.3)
Chloride: 100 mmol/L (ref 98–111)
Creatinine, Ser: 0.87 mg/dL (ref 0.61–1.24)
GFR, Estimated: >60 mL/min (ref >60)
Glucose, Bld: 90 mg/dL (ref 70–99)
Potassium: 3.8 mmol/L (ref 3.5–5.1)
Sodium: 130 mmol/L — ABNORMAL LOW (ref 135–145)
Total Bilirubin: 1 mg/dL (ref 0.3–1.2)
Total Protein: 5.8 g/dL — ABNORMAL LOW (ref 6.5–8.1)

## 2020-12-19 LAB — CBC
HCT: 39.4 % (ref 39.0–52.0)
Hemoglobin: 13.6 g/dL (ref 13.0–17.0)
MCH: 32 pg (ref 26.0–34.0)
MCHC: 34.5 g/dL (ref 30.0–36.0)
MCV: 92.7 fL (ref 80.0–100.0)
Platelets: 208 10*3/uL (ref 150–400)
RBC: 4.25 MIL/uL (ref 4.22–5.81)
RDW: 13.5 % (ref 11.5–15.5)
WBC: 19.8 10*3/uL — ABNORMAL HIGH (ref 4.0–10.5)
nRBC: 0 % (ref 0.0–0.2)

## 2020-12-19 LAB — MAGNESIUM: Magnesium: 1.6 mg/dL — ABNORMAL LOW (ref 1.7–2.4)

## 2020-12-19 MED ORDER — LATANOPROST 0.005 % OP SOLN
1.0000 [drp] | Freq: Every day | OPHTHALMIC | Status: DC
  Administered 2020-12-19 – 2020-12-23 (×5): 1 [drp] via OPHTHALMIC
  Filled 2020-12-19: qty 2.5

## 2020-12-19 MED ORDER — DOXAZOSIN MESYLATE 2 MG PO TABS
8.0000 mg | ORAL_TABLET | Freq: Every day | ORAL | Status: DC
  Administered 2020-12-19 – 2020-12-23 (×5): 8 mg via ORAL
  Filled 2020-12-19 (×5): qty 4

## 2020-12-19 MED ORDER — SODIUM CHLORIDE 0.9 % IV SOLN
2.0000 g | INTRAVENOUS | Status: AC
  Administered 2020-12-20: 2 g via INTRAVENOUS
  Filled 2020-12-19: qty 2

## 2020-12-19 MED ORDER — CHLORHEXIDINE GLUCONATE CLOTH 2 % EX PADS
6.0000 | MEDICATED_PAD | Freq: Once | CUTANEOUS | Status: AC
  Administered 2020-12-20: 6 via TOPICAL

## 2020-12-19 MED ORDER — DORZOLAMIDE HCL-TIMOLOL MAL 2-0.5 % OP SOLN
1.0000 [drp] | Freq: Two times a day (BID) | OPHTHALMIC | Status: DC
  Administered 2020-12-19 – 2020-12-24 (×11): 1 [drp] via OPHTHALMIC
  Filled 2020-12-19 (×2): qty 10

## 2020-12-19 MED ORDER — CHLORHEXIDINE GLUCONATE CLOTH 2 % EX PADS
6.0000 | MEDICATED_PAD | Freq: Once | CUTANEOUS | Status: AC
  Administered 2020-12-19: 6 via TOPICAL

## 2020-12-19 NOTE — Progress Notes (Signed)
Pt received this shift from ED. Alert and oriented x 4. Oriented to new room, call bell. Denies pain or discomfort at this time. IV fluids initiated and meds given. Pt informed of NPO status and verbalizes understanding. Urinal at bedside. Hearing aides in ears. Will continue to monitor.

## 2020-12-19 NOTE — Progress Notes (Signed)
PROGRESS NOTE    Jeffrey Wheeler  LEX:[id number] DOB: 1935-10-08 DOA: 12/18/2020 PCP: Asencion Noble, MD   Chief Complaint  Patient presents with  . Nausea    Brief admission narrative:  As per H&P written by Dr. Clearence Ped On 12/18/2020  Jeffrey Wheeler  is a 85 y.o. 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 Patient reports that the pain started 4 days ago. It is periumbilical and sharp. The pain has been mostly constant with only an hour or two of waning pain over the last several days. The pain peaked on Saturday night after taking two dulcolax in the afternoon. Patient went to the [facility] in [place]. CT abdomen was done - which was negative, and then patient was sent home. He reports that one Sunday the pain let up briefly, and then came on just as strong. When the pain is there it is severe. Today, he was mostly nauseous, with no episodes of vomiting. He went to see his PCP, Dr. Willey Blade, who advised him to come back to the ER. Patient reports that his last normal meal was 5 days ago, with only clear liquids and crackers since then. He has noticed no change in the pain related to food. Patient reports that his last normal BM 1 week ago.  Patient reports that he usually stays constipated and has to take wheat bran regularly. Wife reports that he has always had "sluggish bowels." Patient is still having flatulence. Patient has chronic chest burning pain associated with GERD. He has never had CAD or seen a cardiologist. Patient has no other complaints at this time.   Doesn't smoke, drink, or use illicit drugs. Vaccinated for covid. Full Code.  In the ED T 98.2, HR 72 - 102, R 18-31 BP 158/96 maintaining sats on room air CT abdomen - acute cholecystitis, BL pleaural effusions, and small hiatal hernia Rocephin, Zofran, and 500 ml bolus in the ED WBC 25 T  Bili 1.4 Lipase 18  Assessment & Plan: 1-acute cholecystitis -Abdominal ultrasound continued concerns of cholecystitis/emphysematous cholecystitis -No fever at this time -Still with inability WBCs and reporting mild right upper quadrant discomfort -No nausea vomiting currently. -Continue IV Rocephin -Follow general surgery recommendations; most likely cholecystectomy on 12/19/20  2-Gastroesophageal reflux disease -Continue PPI  3-Hyponatremia -In the setting of decreased oral intake and dehydration -Improved -Continue gentle IV fluids.  4-history of BPH -No complaints of retention -Resume the use of Cardura.  5-history of renal: -Resume home eyedrops.  DVT prophylaxis: Heparin Code Status: Full code Family Communication: Wife updated at bedside. Disposition:   Status is: Inpatient  Dispo: The patient is from:  home              Anticipated d/c is to: home               Patient currently no medically stable for discharge; abdominal ultrasound demonstrating concerns for Cholecystitis/emphysematous  Cholecystitis.  No fever but with elevated WBCs.  Continue current IV antibiotics, supportive care and n.p.o. status for now.  Most likely cholecystectomy on 12/19/20   Difficult to place patient no    Consultants:   General surgery .   Procedures:  See below for x-ray reports -Abdominal ultrasound: With concerns of cholecystitis/emphysematous cholecystitis. Planning for cholecystectomy on 12/19/20  Antimicrobials:  Rocephin  Subjective: Reporting mild right upper quadrant discomfort; no nausea vomiting  currently.  Patient is afebrile.  Objective: Vitals:   12/18/20 2225 12/19/20 0219 12/19/20 0624 12/19/20 1032  BP: 137/77 127/76 (!) 143/81 129/71  Pulse: (!) 105 94 95 84  Resp: 20 17 18 18   Temp: 98.3 F (36.8 C) 99.2 F (37.3 C) 99.2 F (37.3 C) 99.7 F (37.6 C)  TempSrc: Oral Oral Oral Oral  SpO2: 96% 94% 95% 95%  Weight: 100.8 kg     Height: 5\' 10"  (1.778  m)       Intake/Output Summary (Last 24 hours) at 12/19/2020 1206 Last data filed at 12/19/2020 0900 Gross per 24 hour  Intake 952.93 ml  Output 100 ml  Net 852.93 ml   Filed Weights   12/18/20 1559 12/18/20 2225  Weight: 99.8 kg 100.8 kg    Examination:  General exam: Afebrile, In no major distress; denies chest pain, no shortness of breath.  Mild right upper quadrant discomfort on the palpation.- Respiratory system: Clear to auscultation. Respiratory effort normal.  No requiring oxygen supplementation. Cardiovascular system: S1 & S2 heard, RRR. No JVD, murmurs, rubs, gallops or clicks. No pedal edema. Gastrointestinal system: Abdomen is nondistended, soft and with mild right upper quadrant of the palpation; no guarding.  Positive bowel sounds. Central nervous system: Alert and oriented. No focal neurological deficits. Extremities: No cyanosis or clubbing. Skin: No petechiae. Psychiatry: Judgement and insight appear normal. Mood & affect appropriate.    Data Reviewed: I have personally reviewed following labs and imaging studies  CBC: Recent Labs  Lab 12/18/20 1615 12/19/20 0524  WBC 25.3* 19.8*  NEUTROABS 19.2*  --   HGB 14.2 13.6  HCT 40.2 39.4  MCV 91.8 92.7  PLT 230 347    Basic Metabolic Panel: Recent Labs  Lab 12/18/20 1615 12/19/20 0524  NA 128* 130*  K 4.0 3.8  CL 97* 100  CO2 21* 21*  GLUCOSE 109* 90  BUN 18 14  CREATININE 1.02 0.87  CALCIUM 9.1 8.6*  MG  --  1.6*    GFR: Estimated Creatinine Clearance: 77.9 mL/min (by C-G formula based on SCr of 0.87 mg/dL).  Liver Function Tests: Recent Labs  Lab 12/18/20 1615 12/19/20 0524  AST 30 21  ALT 24 19  ALKPHOS 95 91  BILITOT 1.4* 1.0  PROT 6.9 5.8*  ALBUMIN 3.6 3.0*    CBG: No results for input(s): GLUCAP in the last 168 hours.   Recent Results (from the past 240 hour(s))  Resp Panel by RT-PCR (Flu A&B, Covid) Nasopharyngeal Swab     Status: None   Collection Time: 12/18/20  9:15 PM    Specimen: Nasopharyngeal Swab; Nasopharyngeal(NP) swabs in vial transport medium  Result Value Ref Range Status   SARS Coronavirus 2 by RT PCR NEGATIVE NEGATIVE Final    Comment: (NOTE) SARS-CoV-2 target nucleic acids are NOT DETECTED.  The SARS-CoV-2 RNA is generally detectable in upper respiratory specimens during the acute phase of infection. The lowest concentration of SARS-CoV-2 viral copies this assay can detect is 138 copies/mL. A negative result does not preclude SARS-Cov-2 infection and should not be used as the sole basis for treatment or other patient management decisions. A negative result may occur with  improper specimen collection/handling, submission of specimen other than nasopharyngeal swab, presence of viral mutation(s) within the areas targeted by this assay, and inadequate number of viral copies(<138 copies/mL). A negative result must be combined with clinical observations, patient history, and epidemiological information. The expected result is Negative.  Fact Sheet for Patients:  EntrepreneurPulse.com.au  Fact Sheet for Healthcare Providers:  IncredibleEmployment.be  This test is no t yet approved or cleared by the [place] FDA and  has been authorized for detection and/or diagnosis of SARS-CoV-2 by FDA under an Emergency Use Authorization (EUA). This EUA will remain  in effect (meaning this test can be used) for the duration of the COVID-19 declaration under Section 564(b)(1) of the Act, 21 U.S.C.section 360bbb-3(b)(1), unless the authorization is terminated  or revoked sooner.       Influenza A by PCR NEGATIVE NEGATIVE Final   Influenza B by PCR NEGATIVE NEGATIVE Final    Comment: (NOTE) The Xpert Xpress SARS-CoV-2/FLU/RSV plus assay is intended as an aid in the diagnosis of influenza from Nasopharyngeal swab specimens and should not be used as a sole basis for treatment. Nasal washings and aspirates are  unacceptable for Xpert Xpress SARS-CoV-2/FLU/RSV testing.  Fact Sheet for Patients: EntrepreneurPulse.com.au  Fact Sheet for Healthcare Providers: IncredibleEmployment.be  This test is not yet approved or cleared by the [place] FDA and has been authorized for detection and/or diagnosis of SARS-CoV-2 by FDA under an Emergency Use Authorization (EUA). This EUA will remain in effect (meaning this test can be used) for the duration of the COVID-19 declaration under Section 564(b)(1) of the Act, 21 U.S.C. section 360bbb-3(b)(1), unless the authorization is terminated or revoked.  Performed at [facility], [address], [place], [place] [address]      Radiology Studies: US Abdomen Complete  Result Date: 12/19/2020 CLINICAL DATA:  Pain.  Follow-up CT 12/18/2020. EXAM: ABDOMEN ULTRASOUND COMPLETE COMPARISON:  CT 12/18/2020. FINDINGS: Gallbladder: Sludge noted within the gallbladder. Tiny gallstones cannot be excluded. Echogenic foci within the gallbladder wall and within the gallbladder lumen could represent air. Gallbladder wall is thickened at 3.9 mm. Cholecystitis including emphysematous cholecystitis could present in this fashion. Negative Murphy sign. Common bile duct: Diameter: 4.8 mm Liver: Increased echogenicity consistent fatty infiltration or hepatocellular disease. 1.4 cm simple cyst again noted. Portal vein is patent on color Doppler imaging with normal direction of blood flow towards the liver. IVC: No abnormality visualized. Pancreas: Poorly visualized due to overlying bowel gas. Spleen: Size and appearance within normal limits. Right Kidney: Length: 12.0 cm. Echogenicity within normal limits. No mass or hydronephrosis visualized. Left Kidney: Length: 14.0 cm. Renal size discrepancy most likely related to slight left renal rotation. Echogenicity within normal limits. No mass or hydronephrosis visualized. Abdominal aorta: No aneurysm visualized.  Other findings: Very limited study due to patient's body habitus and bowel gas. IMPRESSION: 1. Sludge noted within the gall bladder. Tiny gallstones cannot be excluded. Echogenic foci within the gallbladder wall and within the gallbladder lumen could represent air. Gallbladder wall is thickened at 3.9 mm. Cholecystitis including emphysematous cholecystitis could present in this fashion. 2. Increased hepatic echogenicity consistent fatty infiltration or hepatocellular disease. These results will be called to the ordering clinician or representative by the Radiologist Assistant, and communication documented in the PACS or [employer]. Electronically Signed   By: Marcello Moores  Register   On: 12/19/2020 11:37   CT ABDOMEN PELVIS W CONTRAST  Result Date: 12/18/2020 CLINICAL DATA:  Abdominal pain EXAM: CT ABDOMEN AND PELVIS WITH CONTRAST TECHNIQUE: Multidetector CT imaging of the abdomen and pelvis was performed using the standard protocol following bolus administration of intravenous contrast. CONTRAST:  132mL OMNIPAQUE IOHEXOL 300 MG/ML  SOLN COMPARISON:  None. FINDINGS: Lower chest: Trace bilateral pleural effusions. Bibasilar atelectasis. Heart is normal size. Small hiatal hernia. Hepatobiliary: Stranding noted around  the gallbladder. Layering gallstone noted in the gallbladder near the gallbladder neck. Findings concerning for possible acute cholecystitis. Small cyst in the right hepatic dome. No suspicious hepatic abnormality. Pancreas: No focal abnormality or ductal dilatation. Spleen: No focal abnormality.  Normal size. Adrenals/Urinary Tract: Nodule in the right adrenal gland measures 1.5 cm. Left adrenal gland and kidneys unremarkable. Urinary bladder unremarkable. Stomach/Bowel: Stomach, large and small bowel grossly unremarkable. Vascular/Lymphatic: Aortic atherosclerosis. No evidence of aneurysm or adenopathy. Reproductive: Radiation. Seeds in the region of the prostate which is mildly prominent Other: No  free fluid or free air. Musculoskeletal: No acute and degenerative bony abnormality changes in the lumbar spine. Scoliosis IMPRESSION: Cholelithiasis. Stranding noted around the gallbladder concerning for acute cholecystitis. Trace bilateral pleural effusions.  Bibasilar atelectasis. Small hiatal hernia. Aortic atherosclerosis. Electronically Signed   By: Rolm Baptise M.D.   On: 12/18/2020 19:09    Scheduled Meds: . dorzolamide-timolol  1 drop Both Eyes BID  . doxazosin  8 mg Oral QHS  . gabapentin  100 mg Oral QHS  . heparin  5,000 Units Subcutaneous Q8H  . latanoprost  1 drop Both Eyes QHS  . pantoprazole (PROTONIX) IV  40 mg Intravenous Q24H  . vitamin B-12  1,000 mcg Oral QHS   Continuous Infusions: . sodium chloride 75 mL/hr at 12/19/20 0636  . cefTRIAXone (ROCEPHIN)  IV       LOS: 1 day    Time spent: 25 minutes   Barton Dubois, MD [facility]   To contact the attending provider between 7A-7P or the covering provider during after hours 7P-7A, please log into the web site [email or web address] and access using universal [place] password for that web site. If you do not have the password, please call the hospital operator.  12/19/2020, 12:06 PM

## 2020-12-19 NOTE — H&P (View-Only) (Signed)
[facility] Consult  Reason for Consult: Acute cholecystitis  Referring Physician:  Dr. Dyann Kief   Chief Complaint    Nausea      HPI: Jeffrey Wheeler is a 85 y.o. male with prostate disease, hydrocephalus s/p VP shunt with Dr. Zada Finders 08/2020, chronic back pain, who presented with 5 days of abdominal pain and associated nausea and vomiting. This all peaked on Saturday and he ha a CT a/p done at [facility] and this was reported to be negative and he was sent home.  He says the pain is severe in the RUQ and constant in nature now.He was sent to the ED by Dr. Willey Blade.  He says he has a history of chronic constipation. He has never had any cardiac issues.  His abdominal pain has actually improved since admission and he is hungry. CT overnight demonstrated stones and distended gallbladder, [place] today demonstrates thickened wall and possible foci of air in the wall of the gallbladder. He has had an improving leukocytosis and pain since he was started on antibiotics.   He has had no issues with his VP shunt since it was placed but has had the pressure adjusted some reports his wife.   Past Medical History:  Diagnosis Date  . Back pain   . BPH (benign prostatic hyperplasia)   . Cancer ([place])    basal cell skin cancer  . Coxsackie viruses    age 34  . Gastroesophageal reflux disease   . Generalized hyperhidrosis   . GERD (gastroesophageal reflux disease)   . Glaucoma   . High cholesterol   . Osteoarthritis   . Pneumonia    aspiration pneumonia  . Primary localized osteoarthritis of right knee   . Prostate disease   . Scoliosis     Past Surgical History:  Procedure Laterality Date  . APPENDECTOMY  1969  . EYE SURGERY     laser surgery for glaucoma, cataract surgery on both eyes  . KNEE ARTHROSCOPY W/ MENISCECTOMY Right   . TONSILLECTOMY    . TOTAL KNEE ARTHROPLASTY Right 09/12/2014   Procedure: RIGHT TOTAL KNEE ARTHROPLASTY;  Surgeon: Lorn Junes, MD;   Location: [place];  Service: Orthopedics;  Laterality: Right;  . TUMOR EXCISION Right 40 yrs ago   calf  . VENTRICULOPERITONEAL SHUNT Right 08/23/2020   Procedure: Right ventriculoperitoneal shunt placement;  Surgeon: Judith Part, MD;  Location: [place];  Service: Neurosurgery;  Laterality: Right;    Family History  Problem Relation Age of Onset  . Diabetes Other   . CAD Other   . Hypertension Other   . CVA Other   . Multiple myeloma Mother   . Cirrhosis Father     Social History   Tobacco Use  . Smoking status: Former Research scientist (life sciences)  . Smokeless tobacco: Former Systems developer    Quit date: 08/31/2000  Substance Use Topics  . Alcohol use: Yes    Alcohol/week: 3.0 standard drinks    Types: 3 Shots of liquor per week    Comment: occasional  . Drug use: No    Medications:  I have reviewed the patient's current medications. Prior to Admission:  Medications Prior to Admission  Medication Sig Dispense Refill Last Dose  . docusate sodium (COLACE) 100 MG capsule Take 300 mg by mouth at bedtime.   Past Week at Unknown time  . dorzolamide-timolol (COSOPT) 22.3-6.8 MG/ML ophthalmic solution Place 1 drop into both eyes 2 (two) times daily.   12 12/17/2020 at Unknown time  .  doxazosin (CARDURA) 8 MG tablet Take 8 mg by mouth at bedtime.    12/17/2020 at Unknown time  . gabapentin (NEURONTIN) 300 MG capsule Take 100 mg by mouth at bedtime.   12/17/2020 at Unknown time  . HYDROcodone-acetaminophen (NORCO/VICODIN) 5-325 MG tablet Take 1 tablet by mouth every 4 (four) hours as needed for moderate pain. 30 tablet 0 Past Month at Unknown time  . latanoprost (XALATAN) 0.005 % ophthalmic solution Place 1 drop into both eyes at bedtime.   12/17/2020 at Unknown time  . mirabegron ER (MYRBETRIQ) 25 MG TB24 tablet Take 25 mg by mouth at bedtime.    12/17/2020 at Unknown time  . pantoprazole (PROTONIX) 40 MG tablet Take 40 mg by mouth 2 (two) times daily before a meal.    12/18/2020 at Unknown time  . valACYclovir (VALTREX)  1000 MG tablet Take 1,000 mg by mouth at bedtime.    12/17/2020 at Unknown time  . vitamin B-12 (CYANOCOBALAMIN) 1000 MCG tablet Take 1,000 mcg by mouth at bedtime.   12/17/2020 at Unknown time  . aspirin EC 81 MG tablet Take 1 tablet (81 mg total) by mouth at bedtime. Restart on 08/27/20 30 tablet 11 More than a month at Unknown time   Scheduled: . dorzolamide-timolol  1 drop Both Eyes BID  . doxazosin  8 mg Oral QHS  . gabapentin  100 mg Oral QHS  . heparin  5,000 Units Subcutaneous Q8H  . latanoprost  1 drop Both Eyes QHS  . pantoprazole (PROTONIX) IV  40 mg Intravenous Q24H  . vitamin B-12  1,000 mcg Oral QHS   Continuous: . sodium chloride 75 mL/hr at 12/19/20 1451  . cefTRIAXone (ROCEPHIN)  IV     AVW:[id number] **OR** acetaminophen, HYDROcodone-acetaminophen, morphine injection, ondansetron **OR** ondansetron (ZOFRAN) IV  No Known Allergies   ROS:  A comprehensive review of systems was negative except for: Gastrointestinal: positive for abdominal pain, nausea and vomiting  Blood pressure (!) 148/86, pulse 88, temperature 99.3 F (37.4 C), temperature source Oral, resp. rate 17, height _0  (1.778 m), weight 100.8 kg, SpO2 94 %. Physical Exam Vitals reviewed.  Constitutional:      Appearance: Normal appearance.  HENT:     Head: Normocephalic.     Comments: VP shunt noted on right skull    Nose: Nose normal.  Eyes:     Extraocular Movements: Extraocular movements intact.  Cardiovascular:     Rate and Rhythm: Normal rate.  Pulmonary:     Effort: Pulmonary effort is normal.  Abdominal:     General: There is distension.     Palpations: Abdomen is soft.     Tenderness: There is abdominal tenderness.     Comments: VP shunt catheter noted in upper abdomen and small epigastric scar from peritoneal placement, RLQ scar from appendectomy  Musculoskeletal:        General: No swelling. Normal range of motion.     Cervical back: No rigidity.  Skin:    General: Skin is warm  and dry.  Neurological:     General: No focal deficit present.     Mental Status: He is alert and oriented to person, place, and time.  Psychiatric:        Mood and Affect: Mood normal.        Behavior: Behavior normal.        Thought Content: Thought content normal.        Judgment: Judgment normal.     Results: Results for orders  placed or performed during the hospital encounter of 12/18/20 (from the past 48 hour(s))  CBC with Differential/Platelet     Status: Abnormal   Collection Time: 12/18/20  4:15 PM  Result Value Ref Range   WBC 25.3 (H) 4.0 - 10.5 K/uL   RBC 4.38 4.22 - 5.81 MIL/uL   Hemoglobin 14.2 13.0 - 17.0 g/dL   HCT 40.2 39.0 - 52.0 %   MCV 91.8 80.0 - 100.0 fL   MCH 32.4 26.0 - 34.0 pg   MCHC 35.3 30.0 - 36.0 g/dL   RDW 13.4 11.5 - 15.5 %   Platelets 230 150 - 400 K/uL   nRBC 0.0 0.0 - 0.2 %   Neutrophils Relative % 77 %   Neutro Abs 19.2 (H) 1.7 - 7.7 K/uL   Lymphocytes Relative 17 %   Lymphs Abs 4.4 (H) 0.7 - 4.0 K/uL   Monocytes Relative 5 %   Monocytes Absolute 1.3 (H) 0.1 - 1.0 K/uL   Eosinophils Relative 0 %   Eosinophils Absolute 0.1 0.0 - 0.5 K/uL   Basophils Relative 0 %   Basophils Absolute 0.1 0.0 - 0.1 K/uL   Immature Granulocytes 1 %   Abs Immature Granulocytes 0.28 (H) 0.00 - 0.07 K/uL    Comment: Performed at [facility], [address], [place], [place] [address]  Comprehensive metabolic panel     Status: Abnormal   Collection Time: 12/18/20  4:15 PM  Result Value Ref Range   Sodium 128 (L) 135 - 145 mmol/L   Potassium 4.0 3.5 - 5.1 mmol/L   Chloride 97 (L) 98 - 111 mmol/L   CO2 21 (L) 22 - 32 mmol/L   Glucose, Bld 109 (H) 70 - 99 mg/dL    Comment: Glucose reference range applies only to samples taken after fasting for at least 8 hours.   BUN 18 8 - 23 mg/dL   Creatinine, Ser 1.02 0.61 - 1.24 mg/dL   Calcium 9.1 8.9 - 10.3 mg/dL   Total Protein 6.9 6.5 - 8.1 g/dL   Albumin 3.6 3.5 - 5.0 g/dL   AST 30 15 - 41 U/L   ALT 24 0 - 44  U/L   Alkaline Phosphatase 95 38 - 126 U/L   Total Bilirubin 1.4 (H) 0.3 - 1.2 mg/dL   GFR, Estimated >60 >60 mL/min    Comment: (NOTE) Calculated using the CKD-EPI Creatinine Equation (2021)    Anion gap 10 5 - 15    Comment: Performed at [facility], [address] [place], [place] [address]  Lipase, blood     Status: None   Collection Time: 12/18/20  4:15 PM  Result Value Ref Range   Lipase 18 11 - 51 U/L    Comment: Performed at [facility], [address], [place],  [address]  Urinalysis, Routine w reflex microscopic Urine, Clean Catch     Status: Abnormal   Collection Time: 12/18/20  4:42 PM  Result Value Ref Range   Color, Urine AMBER (A) YELLOW    Comment: BIOCHEMICALS MAY BE AFFECTED BY COLOR   APPearance HAZY (A) CLEAR   Specific Gravity, Urine 1.034 (H) 1.005 - 1.030   pH 5.0 5.0 - 8.0   Glucose, UA NEGATIVE NEGATIVE mg/dL   Hgb urine dipstick SMALL (A) NEGATIVE   Bilirubin Urine NEGATIVE NEGATIVE   Ketones, ur 5 (A) NEGATIVE mg/dL   Protein, ur 100 (A) NEGATIVE mg/dL   Nitrite NEGATIVE NEGATIVE   Leukocytes,Ua NEGATIVE NEGATIVE   RBC /  HPF 0-5 0 - 5 RBC/hpf   WBC, UA 0-5 0 - 5 WBC/hpf   Bacteria, UA NONE SEEN NONE SEEN   Squamous Epithelial / LPF 0-5 0 - 5   Mucus PRESENT     Comment: Performed at [facility], [address] [place], [place] [address]  Resp Panel by RT-PCR (Flu A&B, Covid) Nasopharyngeal Swab     Status: None   Collection Time: 12/18/20  9:15 PM   Specimen: Nasopharyngeal Swab; Nasopharyngeal(NP) swabs in vial transport medium  Result Value Ref Range   SARS Coronavirus 2 by RT PCR NEGATIVE NEGATIVE    Comment: (NOTE) SARS-CoV-2 target nucleic acids are NOT DETECTED.  The SARS-CoV-2 RNA is generally detectable in upper respiratory specimens during the acute phase of infection. The lowest concentration of SARS-CoV-2 viral copies this assay can detect is 138 copies/mL. A negative result does not preclude SARS-Cov-2 infection and should  not be used as the sole basis for treatment or other patient management decisions. A negative result may occur with  improper specimen collection/handling, submission of specimen other than nasopharyngeal swab, presence of viral mutation(s) within the areas targeted by this assay, and inadequate number of viral copies(<138 copies/mL). A negative result must be combined with clinical observations, patient history, and epidemiological information. The expected result is Negative.  Fact Sheet for Patients:  EntrepreneurPulse.com.au  Fact Sheet for Healthcare Providers:  IncredibleEmployment.be  This test is no t yet approved or cleared by the [place] FDA and  has been authorized for detection and/or diagnosis of SARS-CoV-2 by FDA under an Emergency Use Authorization (EUA). This EUA will remain  in effect (meaning this test can be used) for the duration of the COVID-19 declaration under Section 564(b)(1) of the Act, 21 U.S.C.section 360bbb-3(b)(1), unless the authorization is terminated  or revoked sooner.       Influenza A by PCR NEGATIVE NEGATIVE   Influenza B by PCR NEGATIVE NEGATIVE    Comment: (NOTE) The Xpert Xpress SARS-CoV-2/FLU/RSV plus assay is intended as an aid in the diagnosis of influenza from Nasopharyngeal swab specimens and should not be used as a sole basis for treatment. Nasal washings and aspirates are unacceptable for Xpert Xpress SARS-CoV-2/FLU/RSV testing.  Fact Sheet for Patients: EntrepreneurPulse.com.au  Fact Sheet for Healthcare Providers: IncredibleEmployment.be  This test is not yet approved or cleared by the [place] FDA and has been authorized for detection and/or diagnosis of SARS-CoV-2 by FDA under an Emergency Use Authorization (EUA). This EUA will remain in effect (meaning this test can be used) for the duration of the COVID-19 declaration under Section 564(b)(1)  of the Act, 21 U.S.C. section 360bbb-3(b)(1), unless the authorization is terminated or revoked.  Performed at [facility], [address], [place], [place] [address]   Lactic acid, plasma     Status: None   Collection Time: 12/18/20  9:54 PM  Result Value Ref Range   Lactic Acid, Venous 1.7 0.5 - 1.9 mmol/L    Comment: Performed at [facility], [address] [place],  [address]  Comprehensive metabolic panel     Status: Abnormal   Collection Time: 12/19/20  5:24 AM  Result Value Ref Range   Sodium 130 (L) 135 - 145 mmol/L   Potassium 3.8 3.5 - 5.1 mmol/L   Chloride 100 98 - 111 mmol/L   CO2 21 (L) 22 - 32 mmol/L   Glucose, Bld 90 70 - 99 mg/dL    Comment: Glucose reference range applies only to samples taken  after fasting for at least 8 hours.   BUN 14 8 - 23 mg/dL   Creatinine, Ser 0.87 0.61 - 1.24 mg/dL   Calcium 8.6 (L) 8.9 - 10.3 mg/dL   Total Protein 5.8 (L) 6.5 - 8.1 g/dL   Albumin 3.0 (L) 3.5 - 5.0 g/dL   AST 21 15 - 41 U/L   ALT 19 0 - 44 U/L   Alkaline Phosphatase 91 38 - 126 U/L   Total Bilirubin 1.0 0.3 - 1.2 mg/dL   GFR, Estimated >60 >60 mL/min    Comment: (NOTE) Calculated using the CKD-EPI Creatinine Equation (2021)    Anion gap 9 5 - 15    Comment: Performed at [facility], [address], [place], [place] [address]  Magnesium     Status: Abnormal   Collection Time: 12/19/20  5:24 AM  Result Value Ref Range   Magnesium 1.6 (L) 1.7 - 2.4 mg/dL    Comment: Performed at [facility], [address], [place], [place] [address]  CBC     Status: Abnormal   Collection Time: 12/19/20  5:24 AM  Result Value Ref Range   WBC 19.8 (H) 4.0 - 10.5 K/uL   RBC 4.25 4.22 - 5.81 MIL/uL   Hemoglobin 13.6 13.0 - 17.0 g/dL   HCT 39.4 39.0 - 52.0 %   MCV 92.7 80.0 - 100.0 fL   MCH 32.0 26.0 - 34.0 pg   MCHC 34.5 30.0 - 36.0 g/dL   RDW 13.5 11.5 - 15.5 %   Platelets 208 150 - 400 K/uL   nRBC 0.0 0.0 - 0.2 %    Comment: Performed at [facility], [address] [place], [place] [address]   Personally reviewed- Cholecystitis of the gallbladder on imaging and VP shunt in the abdomen, track of the catheter seen on CT  US Abdomen Complete  Result Date: 12/19/2020 CLINICAL DATA:  Pain.  Follow-up CT 12/18/2020. EXAM: ABDOMEN ULTRASOUND COMPLETE COMPARISON:  CT 12/18/2020. FINDINGS: Gallbladder: Sludge noted within the gallbladder. Tiny gallstones cannot be excluded. Echogenic foci within the gallbladder wall and within the gallbladder lumen could represent air. Gallbladder wall is thickened at 3.9 mm. Cholecystitis including emphysematous cholecystitis could present in this fashion. Negative Murphy sign. Common bile duct: Diameter: 4.8 mm Liver: Increased echogenicity consistent fatty infiltration or hepatocellular disease. 1.4 cm simple cyst again noted. Portal vein is patent on color Doppler imaging with normal direction of blood flow towards the liver. IVC: No abnormality visualized. Pancreas: Poorly visualized due to overlying bowel gas. Spleen: Size and appearance within normal limits. Right Kidney: Length: 12.0 cm. Echogenicity within normal limits. No mass or hydronephrosis visualized. Left Kidney: Length: 14.0 cm. Renal size discrepancy most likely related to slight left renal rotation. Echogenicity within normal limits. No mass or hydronephrosis visualized. Abdominal aorta: No aneurysm visualized. Other findings: Very limited study due to patient's body habitus and bowel gas. IMPRESSION: 1. Sludge noted within the gall bladder. Tiny gallstones cannot be excluded. Echogenic foci within the gallbladder wall and within the gallbladder lumen could represent air. Gallbladder wall is thickened at 3.9 mm. Cholecystitis including emphysematous cholecystitis could present in this fashion. 2. Increased hepatic echogenicity consistent fatty infiltration or hepatocellular disease. These results will be called to the ordering clinician or representative by the Radiologist  Assistant, and communication documented in the PACS or [employer]. Electronically Signed   By: Marcello Moores  Register   On: 12/19/2020 11:37   CT ABDOMEN PELVIS W CONTRAST  Result Date: 12/18/2020  CLINICAL DATA:  Abdominal pain EXAM: CT ABDOMEN AND PELVIS WITH CONTRAST TECHNIQUE: Multidetector CT imaging of the abdomen and pelvis was performed using the standard protocol following bolus administration of intravenous contrast. CONTRAST:  149m OMNIPAQUE IOHEXOL 300 MG/ML  SOLN COMPARISON:  None. FINDINGS: Lower chest: Trace bilateral pleural effusions. Bibasilar atelectasis. Heart is normal size. Small hiatal hernia. Hepatobiliary: Stranding noted around the gallbladder. Layering gallstone noted in the gallbladder near the gallbladder neck. Findings concerning for possible acute cholecystitis. Small cyst in the right hepatic dome. No suspicious hepatic abnormality. Pancreas: No focal abnormality or ductal dilatation. Spleen: No focal abnormality.  Normal size. Adrenals/Urinary Tract: Nodule in the right adrenal gland measures 1.5 cm. Left adrenal gland and kidneys unremarkable. Urinary bladder unremarkable. Stomach/Bowel: Stomach, large and small bowel grossly unremarkable. Vascular/Lymphatic: Aortic atherosclerosis. No evidence of aneurysm or adenopathy. Reproductive: Radiation. Seeds in the region of the prostate which is mildly prominent Other: No free fluid or free air. Musculoskeletal: No acute and degenerative bony abnormality changes in the lumbar spine. Scoliosis IMPRESSION: Cholelithiasis. Stranding noted around the gallbladder concerning for acute cholecystitis. Trace bilateral pleural effusions.  Bibasilar atelectasis. Small hiatal hernia. Aortic atherosclerosis. Electronically Signed   By: KRolm BaptiseM.D.   On: 12/18/2020 19:09     Assessment & Plan:  EPeyton Spengleris a 85y.o. male with acute cholecystitis on imaging and by lab work. Improving with antibiotics.He does have a VP shunt in place.  He should be able to undergo laparoscopic cholecystectomy but I have reached out to Dr. OZada Findersto ensure he has no issues and no preferences for any antibiotics of if he prefer for him to be transferred down pending any issues with the VP shunt. I can feel and see the path on CT so I do not envision and issue with access.   -PLAN: I counseled the patient about the indication, risks and benefits of laparoscopic cholecystectomy.  He understands there is a very small chance for bleeding, infection, injury to normal structures (including common bile duct), conversion to open surgery, persistent symptoms, evolution of postcholecystectomy diarrhea, need for secondary interventions, anesthesia reaction, cardiopulmonary issues and other risks not specifically detailed here. I described the expected recovery, the plan for follow-up and the restrictions during the recovery phase.  All questions were answered.  Discussed that we are treating with antibiotics now and he is responding Chips and sips for now  Will plan for surgery pending that I do not hear anything different from Dr. OZada Finders   All questions were answered to the satisfaction of the patient and family.   LVirl Cagey4/01/2021, 4:59 PM

## 2020-12-19 NOTE — Consult Note (Signed)
[facility] Consult  Reason for Consult: Acute cholecystitis  Referring Physician:  Dr. Dyann Kief   Chief Complaint    Nausea      HPI: Jeffrey Wheeler is a 85 y.o. male with prostate disease, hydrocephalus s/p VP shunt with Dr. Zada Finders 08/2020, chronic back pain, who presented with 5 days of abdominal pain and associated nausea and vomiting. This all peaked on Saturday and he ha a CT a/p done at [facility] and this was reported to be negative and he was sent home.  He says the pain is severe in the RUQ and constant in nature now.He was sent to the ED by Dr. Willey Blade.  He says he has a history of chronic constipation. He has never had any cardiac issues.  His abdominal pain has actually improved since admission and he is hungry. CT overnight demonstrated stones and distended gallbladder, [place] today demonstrates thickened wall and possible foci of air in the wall of the gallbladder. He has had an improving leukocytosis and pain since he was started on antibiotics.   He has had no issues with his VP shunt since it was placed but has had the pressure adjusted some reports his wife.   Past Medical History:  Diagnosis Date  . Back pain   . BPH (benign prostatic hyperplasia)   . Cancer ([place])    basal cell skin cancer  . Coxsackie viruses    age 34  . Gastroesophageal reflux disease   . Generalized hyperhidrosis   . GERD (gastroesophageal reflux disease)   . Glaucoma   . High cholesterol   . Osteoarthritis   . Pneumonia    aspiration pneumonia  . Primary localized osteoarthritis of right knee   . Prostate disease   . Scoliosis     Past Surgical History:  Procedure Laterality Date  . APPENDECTOMY  1969  . EYE SURGERY     laser surgery for glaucoma, cataract surgery on both eyes  . KNEE ARTHROSCOPY W/ MENISCECTOMY Right   . TONSILLECTOMY    . TOTAL KNEE ARTHROPLASTY Right 09/12/2014   Procedure: RIGHT TOTAL KNEE ARTHROPLASTY;  Surgeon: Lorn Junes, MD;   Location: [place];  Service: Orthopedics;  Laterality: Right;  . TUMOR EXCISION Right 40 yrs ago   calf  . VENTRICULOPERITONEAL SHUNT Right 08/23/2020   Procedure: Right ventriculoperitoneal shunt placement;  Surgeon: Judith Part, MD;  Location: [place];  Service: Neurosurgery;  Laterality: Right;    Family History  Problem Relation Age of Onset  . Diabetes Other   . CAD Other   . Hypertension Other   . CVA Other   . Multiple myeloma Mother   . Cirrhosis Father     Social History   Tobacco Use  . Smoking status: Former Research scientist (life sciences)  . Smokeless tobacco: Former Systems developer    Quit date: 08/31/2000  Substance Use Topics  . Alcohol use: Yes    Alcohol/week: 3.0 standard drinks    Types: 3 Shots of liquor per week    Comment: occasional  . Drug use: No    Medications:  I have reviewed the patient's current medications. Prior to Admission:  Medications Prior to Admission  Medication Sig Dispense Refill Last Dose  . docusate sodium (COLACE) 100 MG capsule Take 300 mg by mouth at bedtime.   Past Week at Unknown time  . dorzolamide-timolol (COSOPT) 22.3-6.8 MG/ML ophthalmic solution Place 1 drop into both eyes 2 (two) times daily.   12 12/17/2020 at Unknown time  .  doxazosin (CARDURA) 8 MG tablet Take 8 mg by mouth at bedtime.    12/17/2020 at Unknown time  . gabapentin (NEURONTIN) 300 MG capsule Take 100 mg by mouth at bedtime.   12/17/2020 at Unknown time  . HYDROcodone-acetaminophen (NORCO/VICODIN) 5-325 MG tablet Take 1 tablet by mouth every 4 (four) hours as needed for moderate pain. 30 tablet 0 Past Month at Unknown time  . latanoprost (XALATAN) 0.005 % ophthalmic solution Place 1 drop into both eyes at bedtime.   12/17/2020 at Unknown time  . mirabegron ER (MYRBETRIQ) 25 MG TB24 tablet Take 25 mg by mouth at bedtime.    12/17/2020 at Unknown time  . pantoprazole (PROTONIX) 40 MG tablet Take 40 mg by mouth 2 (two) times daily before a meal.    12/18/2020 at Unknown time  . valACYclovir (VALTREX)  1000 MG tablet Take 1,000 mg by mouth at bedtime.    12/17/2020 at Unknown time  . vitamin B-12 (CYANOCOBALAMIN) 1000 MCG tablet Take 1,000 mcg by mouth at bedtime.   12/17/2020 at Unknown time  . aspirin EC 81 MG tablet Take 1 tablet (81 mg total) by mouth at bedtime. Restart on 08/27/20 30 tablet 11 More than a month at Unknown time   Scheduled: . dorzolamide-timolol  1 drop Both Eyes BID  . doxazosin  8 mg Oral QHS  . gabapentin  100 mg Oral QHS  . heparin  5,000 Units Subcutaneous Q8H  . latanoprost  1 drop Both Eyes QHS  . pantoprazole (PROTONIX) IV  40 mg Intravenous Q24H  . vitamin B-12  1,000 mcg Oral QHS   Continuous: . sodium chloride 75 mL/hr at 12/19/20 1451  . cefTRIAXone (ROCEPHIN)  IV     AVW:[id number] **OR** acetaminophen, HYDROcodone-acetaminophen, morphine injection, ondansetron **OR** ondansetron (ZOFRAN) IV  No Known Allergies   ROS:  A comprehensive review of systems was negative except for: Gastrointestinal: positive for abdominal pain, nausea and vomiting  Blood pressure (!) 148/86, pulse 88, temperature 99.3 F (37.4 C), temperature source Oral, resp. rate 17, height _0  (1.778 m), weight 100.8 kg, SpO2 94 %. Physical Exam Vitals reviewed.  Constitutional:      Appearance: Normal appearance.  HENT:     Head: Normocephalic.     Comments: VP shunt noted on right skull    Nose: Nose normal.  Eyes:     Extraocular Movements: Extraocular movements intact.  Cardiovascular:     Rate and Rhythm: Normal rate.  Pulmonary:     Effort: Pulmonary effort is normal.  Abdominal:     General: There is distension.     Palpations: Abdomen is soft.     Tenderness: There is abdominal tenderness.     Comments: VP shunt catheter noted in upper abdomen and small epigastric scar from peritoneal placement, RLQ scar from appendectomy  Musculoskeletal:        General: No swelling. Normal range of motion.     Cervical back: No rigidity.  Skin:    General: Skin is warm  and dry.  Neurological:     General: No focal deficit present.     Mental Status: He is alert and oriented to person, place, and time.  Psychiatric:        Mood and Affect: Mood normal.        Behavior: Behavior normal.        Thought Content: Thought content normal.        Judgment: Judgment normal.     Results: Results for orders  placed or performed during the hospital encounter of 12/18/20 (from the past 48 hour(s))  CBC with Differential/Platelet     Status: Abnormal   Collection Time: 12/18/20  4:15 PM  Result Value Ref Range   WBC 25.3 (H) 4.0 - 10.5 K/uL   RBC 4.38 4.22 - 5.81 MIL/uL   Hemoglobin 14.2 13.0 - 17.0 g/dL   HCT 40.2 39.0 - 52.0 %   MCV 91.8 80.0 - 100.0 fL   MCH 32.4 26.0 - 34.0 pg   MCHC 35.3 30.0 - 36.0 g/dL   RDW 13.4 11.5 - 15.5 %   Platelets 230 150 - 400 K/uL   nRBC 0.0 0.0 - 0.2 %   Neutrophils Relative % 77 %   Neutro Abs 19.2 (H) 1.7 - 7.7 K/uL   Lymphocytes Relative 17 %   Lymphs Abs 4.4 (H) 0.7 - 4.0 K/uL   Monocytes Relative 5 %   Monocytes Absolute 1.3 (H) 0.1 - 1.0 K/uL   Eosinophils Relative 0 %   Eosinophils Absolute 0.1 0.0 - 0.5 K/uL   Basophils Relative 0 %   Basophils Absolute 0.1 0.0 - 0.1 K/uL   Immature Granulocytes 1 %   Abs Immature Granulocytes 0.28 (H) 0.00 - 0.07 K/uL    Comment: Performed at [facility], [address], [place], [place] [address]  Comprehensive metabolic panel     Status: Abnormal   Collection Time: 12/18/20  4:15 PM  Result Value Ref Range   Sodium 128 (L) 135 - 145 mmol/L   Potassium 4.0 3.5 - 5.1 mmol/L   Chloride 97 (L) 98 - 111 mmol/L   CO2 21 (L) 22 - 32 mmol/L   Glucose, Bld 109 (H) 70 - 99 mg/dL    Comment: Glucose reference range applies only to samples taken after fasting for at least 8 hours.   BUN 18 8 - 23 mg/dL   Creatinine, Ser 1.02 0.61 - 1.24 mg/dL   Calcium 9.1 8.9 - 10.3 mg/dL   Total Protein 6.9 6.5 - 8.1 g/dL   Albumin 3.6 3.5 - 5.0 g/dL   AST 30 15 - 41 U/L   ALT 24 0 - 44  U/L   Alkaline Phosphatase 95 38 - 126 U/L   Total Bilirubin 1.4 (H) 0.3 - 1.2 mg/dL   GFR, Estimated >60 >60 mL/min    Comment: (NOTE) Calculated using the CKD-EPI Creatinine Equation (2021)    Anion gap 10 5 - 15    Comment: Performed at [facility], [address] [place], [place] [address]  Lipase, blood     Status: Wheeler   Collection Time: 12/18/20  4:15 PM  Result Value Ref Range   Lipase 18 11 - 51 U/L    Comment: Performed at [facility], [address], [place],  [address]  Urinalysis, Routine w reflex microscopic Urine, Clean Catch     Status: Abnormal   Collection Time: 12/18/20  4:42 PM  Result Value Ref Range   Color, Urine AMBER (A) YELLOW    Comment: BIOCHEMICALS MAY BE AFFECTED BY COLOR   APPearance HAZY (A) CLEAR   Specific Gravity, Urine 1.034 (H) 1.005 - 1.030   pH 5.0 5.0 - 8.0   Glucose, UA NEGATIVE NEGATIVE mg/dL   Hgb urine dipstick SMALL (A) NEGATIVE   Bilirubin Urine NEGATIVE NEGATIVE   Ketones, ur 5 (A) NEGATIVE mg/dL   Protein, ur 100 (A) NEGATIVE mg/dL   Nitrite NEGATIVE NEGATIVE   Leukocytes,Ua NEGATIVE NEGATIVE   RBC /  HPF 0-5 0 - 5 RBC/hpf   WBC, UA 0-5 0 - 5 WBC/hpf   Bacteria, UA Wheeler SEEN Wheeler SEEN   Squamous Epithelial / LPF 0-5 0 - 5   Mucus PRESENT     Comment: Performed at [facility], [address] [place], [place] [address]  Resp Panel by RT-PCR (Flu A&B, Covid) Nasopharyngeal Swab     Status: Wheeler   Collection Time: 12/18/20  9:15 PM   Specimen: Nasopharyngeal Swab; Nasopharyngeal(NP) swabs in vial transport medium  Result Value Ref Range   SARS Coronavirus 2 by RT PCR NEGATIVE NEGATIVE    Comment: (NOTE) SARS-CoV-2 target nucleic acids are NOT DETECTED.  The SARS-CoV-2 RNA is generally detectable in upper respiratory specimens during the acute phase of infection. The lowest concentration of SARS-CoV-2 viral copies this assay can detect is 138 copies/mL. A negative result does not preclude SARS-Cov-2 infection and should  not be used as the sole basis for treatment or other patient management decisions. A negative result may occur with  improper specimen collection/handling, submission of specimen other than nasopharyngeal swab, presence of viral mutation(s) within the areas targeted by this assay, and inadequate number of viral copies(<138 copies/mL). A negative result must be combined with clinical observations, patient history, and epidemiological information. The expected result is Negative.  Fact Sheet for Patients:  EntrepreneurPulse.com.au  Fact Sheet for Healthcare Providers:  IncredibleEmployment.be  This test is no t yet approved or cleared by the [place] FDA and  has been authorized for detection and/or diagnosis of SARS-CoV-2 by FDA under an Emergency Use Authorization (EUA). This EUA will remain  in effect (meaning this test can be used) for the duration of the COVID-19 declaration under Section 564(b)(1) of the Act, 21 U.S.C.section 360bbb-3(b)(1), unless the authorization is terminated  or revoked sooner.       Influenza A by PCR NEGATIVE NEGATIVE   Influenza B by PCR NEGATIVE NEGATIVE    Comment: (NOTE) The Xpert Xpress SARS-CoV-2/FLU/RSV plus assay is intended as an aid in the diagnosis of influenza from Nasopharyngeal swab specimens and should not be used as a sole basis for treatment. Nasal washings and aspirates are unacceptable for Xpert Xpress SARS-CoV-2/FLU/RSV testing.  Fact Sheet for Patients: EntrepreneurPulse.com.au  Fact Sheet for Healthcare Providers: IncredibleEmployment.be  This test is not yet approved or cleared by the [place] FDA and has been authorized for detection and/or diagnosis of SARS-CoV-2 by FDA under an Emergency Use Authorization (EUA). This EUA will remain in effect (meaning this test can be used) for the duration of the COVID-19 declaration under Section 564(b)(1)  of the Act, 21 U.S.C. section 360bbb-3(b)(1), unless the authorization is terminated or revoked.  Performed at [facility], [address], [place], [place] [address]   Lactic acid, plasma     Status: Wheeler   Collection Time: 12/18/20  9:54 PM  Result Value Ref Range   Lactic Acid, Venous 1.7 0.5 - 1.9 mmol/L    Comment: Performed at [facility], [address] [place], [place] [address]  Comprehensive metabolic panel     Status: Abnormal   Collection Time: 12/19/20  5:24 AM  Result Value Ref Range   Sodium 130 (L) 135 - 145 mmol/L   Potassium 3.8 3.5 - 5.1 mmol/L   Chloride 100 98 - 111 mmol/L   CO2 21 (L) 22 - 32 mmol/L   Glucose, Bld 90 70 - 99 mg/dL    Comment: Glucose reference range applies only to samples taken  after fasting for at least 8 hours.   BUN 14 8 - 23 mg/dL   Creatinine, Ser 0.87 0.61 - 1.24 mg/dL   Calcium 8.6 (L) 8.9 - 10.3 mg/dL   Total Protein 5.8 (L) 6.5 - 8.1 g/dL   Albumin 3.0 (L) 3.5 - 5.0 g/dL   AST 21 15 - 41 U/L   ALT 19 0 - 44 U/L   Alkaline Phosphatase 91 38 - 126 U/L   Total Bilirubin 1.0 0.3 - 1.2 mg/dL   GFR, Estimated >60 >60 mL/min    Comment: (NOTE) Calculated using the CKD-EPI Creatinine Equation (2021)    Anion gap 9 5 - 15    Comment: Performed at [facility], [address], [place], [place] [address]  Magnesium     Status: Abnormal   Collection Time: 12/19/20  5:24 AM  Result Value Ref Range   Magnesium 1.6 (L) 1.7 - 2.4 mg/dL    Comment: Performed at [facility], [address], [place], [place] [address]  CBC     Status: Abnormal   Collection Time: 12/19/20  5:24 AM  Result Value Ref Range   WBC 19.8 (H) 4.0 - 10.5 K/uL   RBC 4.25 4.22 - 5.81 MIL/uL   Hemoglobin 13.6 13.0 - 17.0 g/dL   HCT 39.4 39.0 - 52.0 %   MCV 92.7 80.0 - 100.0 fL   MCH 32.0 26.0 - 34.0 pg   MCHC 34.5 30.0 - 36.0 g/dL   RDW 13.5 11.5 - 15.5 %   Platelets 208 150 - 400 K/uL   nRBC 0.0 0.0 - 0.2 %    Comment: Performed at [facility], [address] [place], [place] [address]   Personally reviewed- Cholecystitis of the gallbladder on imaging and VP shunt in the abdomen, track of the catheter seen on CT  US Abdomen Complete  Result Date: 12/19/2020 CLINICAL DATA:  Pain.  Follow-up CT 12/18/2020. EXAM: ABDOMEN ULTRASOUND COMPLETE COMPARISON:  CT 12/18/2020. FINDINGS: Gallbladder: Sludge noted within the gallbladder. Tiny gallstones cannot be excluded. Echogenic foci within the gallbladder wall and within the gallbladder lumen could represent air. Gallbladder wall is thickened at 3.9 mm. Cholecystitis including emphysematous cholecystitis could present in this fashion. Negative Murphy sign. Common bile duct: Diameter: 4.8 mm Liver: Increased echogenicity consistent fatty infiltration or hepatocellular disease. 1.4 cm simple cyst again noted. Portal vein is patent on color Doppler imaging with normal direction of blood flow towards the liver. IVC: No abnormality visualized. Pancreas: Poorly visualized due to overlying bowel gas. Spleen: Size and appearance within normal limits. Right Kidney: Length: 12.0 cm. Echogenicity within normal limits. No mass or hydronephrosis visualized. Left Kidney: Length: 14.0 cm. Renal size discrepancy most likely related to slight left renal rotation. Echogenicity within normal limits. No mass or hydronephrosis visualized. Abdominal aorta: No aneurysm visualized. Other findings: Very limited study due to patient's body habitus and bowel gas. IMPRESSION: 1. Sludge noted within the gall bladder. Tiny gallstones cannot be excluded. Echogenic foci within the gallbladder wall and within the gallbladder lumen could represent air. Gallbladder wall is thickened at 3.9 mm. Cholecystitis including emphysematous cholecystitis could present in this fashion. 2. Increased hepatic echogenicity consistent fatty infiltration or hepatocellular disease. These results will be called to the ordering clinician or representative by the Radiologist  Assistant, and communication documented in the PACS or [employer]. Electronically Signed   By: Marcello Moores  Register   On: 12/19/2020 11:37   CT ABDOMEN PELVIS W CONTRAST  Result Date: 12/18/2020  CLINICAL DATA:  Abdominal pain EXAM: CT ABDOMEN AND PELVIS WITH CONTRAST TECHNIQUE: Multidetector CT imaging of the abdomen and pelvis was performed using the standard protocol following bolus administration of intravenous contrast. CONTRAST:  149m OMNIPAQUE IOHEXOL 300 MG/ML  SOLN COMPARISON:  Wheeler. FINDINGS: Lower chest: Trace bilateral pleural effusions. Bibasilar atelectasis. Heart is normal size. Small hiatal hernia. Hepatobiliary: Stranding noted around the gallbladder. Layering gallstone noted in the gallbladder near the gallbladder neck. Findings concerning for possible acute cholecystitis. Small cyst in the right hepatic dome. No suspicious hepatic abnormality. Pancreas: No focal abnormality or ductal dilatation. Spleen: No focal abnormality.  Normal size. Adrenals/Urinary Tract: Nodule in the right adrenal gland measures 1.5 cm. Left adrenal gland and kidneys unremarkable. Urinary bladder unremarkable. Stomach/Bowel: Stomach, large and small bowel grossly unremarkable. Vascular/Lymphatic: Aortic atherosclerosis. No evidence of aneurysm or adenopathy. Reproductive: Radiation. Seeds in the region of the prostate which is mildly prominent Other: No free fluid or free air. Musculoskeletal: No acute and degenerative bony abnormality changes in the lumbar spine. Scoliosis IMPRESSION: Cholelithiasis. Stranding noted around the gallbladder concerning for acute cholecystitis. Trace bilateral pleural effusions.  Bibasilar atelectasis. Small hiatal hernia. Aortic atherosclerosis. Electronically Signed   By: KRolm BaptiseM.D.   On: 12/18/2020 19:09     Assessment & Plan:  EPeyton Spengleris a 85y.o. male with acute cholecystitis on imaging and by lab work. Improving with antibiotics.He does have a VP shunt in place.  He should be able to undergo laparoscopic cholecystectomy but I have reached out to Dr. OZada Findersto ensure he has no issues and no preferences for any antibiotics of if he prefer for him to be transferred down pending any issues with the VP shunt. I can feel and see the path on CT so I do not envision and issue with access.   -PLAN: I counseled the patient about the indication, risks and benefits of laparoscopic cholecystectomy.  He understands there is a very small chance for bleeding, infection, injury to normal structures (including common bile duct), conversion to open surgery, persistent symptoms, evolution of postcholecystectomy diarrhea, need for secondary interventions, anesthesia reaction, cardiopulmonary issues and other risks not specifically detailed here. I described the expected recovery, the plan for follow-up and the restrictions during the recovery phase.  All questions were answered.  Discussed that we are treating with antibiotics now and he is responding Chips and sips for now  Will plan for surgery pending that I do not hear anything different from Dr. OZada Finders   All questions were answered to the satisfaction of the patient and family.   LVirl Cagey4/01/2021, 4:59 PM

## 2020-12-20 ENCOUNTER — Encounter (HOSPITAL_COMMUNITY)
Admission: EM | Disposition: A | Discharge status: Home Health | Payer: Self-pay | Source: Ambulatory Visit | Attending: Internal Medicine

## 2020-12-20 ENCOUNTER — Inpatient Hospital Stay (HOSPITAL_COMMUNITY): Payer: Medicare PPO | Admitting: Certified Registered"

## 2020-12-20 ENCOUNTER — Encounter (HOSPITAL_COMMUNITY): Payer: Self-pay | Admitting: Family Medicine

## 2020-12-20 DIAGNOSIS — K810 Acute cholecystitis: Secondary | ICD-10-CM

## 2020-12-20 HISTORY — PX: CHOLECYSTECTOMY: SHX55

## 2020-12-20 LAB — COMPREHENSIVE METABOLIC PANEL
ALT: 17 U/L (ref 0–44)
AST: 17 U/L (ref 15–41)
Albumin: 2.8 g/dL — ABNORMAL LOW (ref 3.5–5.0)
Alkaline Phosphatase: 97 U/L (ref 38–126)
Anion gap: 10 (ref 5–15)
BUN: 13 mg/dL (ref 8–23)
CO2: 22 mmol/L (ref 22–32)
Calcium: 8.6 mg/dL — ABNORMAL LOW (ref 8.9–10.3)
Chloride: 101 mmol/L (ref 98–111)
Creatinine, Ser: 0.88 mg/dL (ref 0.61–1.24)
GFR, Estimated: >60 mL/min (ref >60)
Glucose, Bld: 83 mg/dL (ref 70–99)
Potassium: 3.5 mmol/L (ref 3.5–5.1)
Sodium: 133 mmol/L — ABNORMAL LOW (ref 135–145)
Total Bilirubin: 1 mg/dL (ref 0.3–1.2)
Total Protein: 5.7 g/dL — ABNORMAL LOW (ref 6.5–8.1)

## 2020-12-20 LAB — CBC WITH DIFFERENTIAL/PLATELET
Abs Immature Granulocytes: 0.08 10*3/uL — ABNORMAL HIGH (ref 0.00–0.07)
Basophils Absolute: 0 10*3/uL (ref 0.0–0.1)
Basophils Relative: 0 %
Eosinophils Absolute: 0.1 10*3/uL (ref 0.0–0.5)
Eosinophils Relative: 0 %
HCT: 38.3 % — ABNORMAL LOW (ref 39.0–52.0)
Hemoglobin: 13 g/dL (ref 13.0–17.0)
Immature Granulocytes: 1 %
Lymphocytes Relative: 20 %
Lymphs Abs: 3.3 10*3/uL (ref 0.7–4.0)
MCH: 31.7 pg (ref 26.0–34.0)
MCHC: 33.9 g/dL (ref 30.0–36.0)
MCV: 93.4 fL (ref 80.0–100.0)
Monocytes Absolute: 1.2 10*3/uL — ABNORMAL HIGH (ref 0.1–1.0)
Monocytes Relative: 7 %
Neutro Abs: 11.9 10*3/uL — ABNORMAL HIGH (ref 1.7–7.7)
Neutrophils Relative %: 72 %
Platelets: 237 10*3/uL (ref 150–400)
RBC: 4.1 MIL/uL — ABNORMAL LOW (ref 4.22–5.81)
RDW: 13.5 % (ref 11.5–15.5)
WBC: 16.7 10*3/uL — ABNORMAL HIGH (ref 4.0–10.5)
nRBC: 0 % (ref 0.0–0.2)

## 2020-12-20 SURGERY — LAPAROSCOPIC CHOLECYSTECTOMY
Anesthesia: General | Site: Abdomen

## 2020-12-20 MED ORDER — DEXAMETHASONE SODIUM PHOSPHATE 10 MG/ML IJ SOLN
INTRAMUSCULAR | Status: AC
  Filled 2020-12-20: qty 1

## 2020-12-20 MED ORDER — PROPOFOL 10 MG/ML IV BOLUS
INTRAVENOUS | Status: AC
  Filled 2020-12-20: qty 20

## 2020-12-20 MED ORDER — HEMOSTATIC AGENTS (NO CHARGE) OPTIME
TOPICAL | Status: DC | PRN
  Administered 2020-12-20: 1 via TOPICAL

## 2020-12-20 MED ORDER — EPHEDRINE 5 MG/ML INJ
INTRAVENOUS | Status: AC
  Filled 2020-12-20: qty 10

## 2020-12-20 MED ORDER — SODIUM CHLORIDE 0.9 % IR SOLN
Status: DC | PRN
  Administered 2020-12-20: 3000 mL

## 2020-12-20 MED ORDER — LIDOCAINE 2% (20 MG/ML) 5 ML SYRINGE
INTRAMUSCULAR | Status: DC | PRN
  Administered 2020-12-20: 100 mg via INTRAVENOUS

## 2020-12-20 MED ORDER — ROCURONIUM BROMIDE 10 MG/ML (PF) SYRINGE
PREFILLED_SYRINGE | INTRAVENOUS | Status: AC
  Filled 2020-12-20: qty 10

## 2020-12-20 MED ORDER — HEMOSTATIC AGENTS (NO CHARGE) OPTIME
TOPICAL | Status: DC | PRN
  Administered 2020-12-20: 2 via TOPICAL

## 2020-12-20 MED ORDER — PROPOFOL 10 MG/ML IV BOLUS
INTRAVENOUS | Status: DC | PRN
  Administered 2020-12-20: 100 mg via INTRAVENOUS

## 2020-12-20 MED ORDER — PIPERACILLIN-TAZOBACTAM 3.375 G IVPB
3.3750 g | Freq: Three times a day (TID) | INTRAVENOUS | Status: DC
  Administered 2020-12-20 – 2020-12-24 (×11): 3.375 g via INTRAVENOUS
  Filled 2020-12-20 (×14): qty 50

## 2020-12-20 MED ORDER — ONDANSETRON HCL 4 MG/2ML IJ SOLN
INTRAMUSCULAR | Status: AC
  Filled 2020-12-20: qty 2

## 2020-12-20 MED ORDER — FENTANYL CITRATE (PF) 250 MCG/5ML IJ SOLN
INTRAMUSCULAR | Status: AC
  Filled 2020-12-20: qty 5

## 2020-12-20 MED ORDER — KETOROLAC TROMETHAMINE 30 MG/ML IJ SOLN
INTRAMUSCULAR | Status: AC
  Filled 2020-12-20: qty 1

## 2020-12-20 MED ORDER — ONDANSETRON HCL 4 MG/2ML IJ SOLN
INTRAMUSCULAR | Status: DC | PRN
  Administered 2020-12-20: 4 mg via INTRAVENOUS

## 2020-12-20 MED ORDER — SODIUM CHLORIDE 0.9 % IR SOLN
Status: DC | PRN
  Administered 2020-12-20: 1

## 2020-12-20 MED ORDER — BUPIVACAINE HCL (PF) 0.5 % IJ SOLN
INTRAMUSCULAR | Status: AC
  Filled 2020-12-20: qty 30

## 2020-12-20 MED ORDER — FENTANYL CITRATE (PF) 100 MCG/2ML IJ SOLN
INTRAMUSCULAR | Status: DC | PRN
  Administered 2020-12-20 (×2): 50 ug via INTRAVENOUS

## 2020-12-20 MED ORDER — ROCURONIUM BROMIDE 10 MG/ML (PF) SYRINGE
PREFILLED_SYRINGE | INTRAVENOUS | Status: DC | PRN
  Administered 2020-12-20: 50 mg via INTRAVENOUS

## 2020-12-20 MED ORDER — EPHEDRINE SULFATE-NACL 50-0.9 MG/10ML-% IV SOSY
PREFILLED_SYRINGE | INTRAVENOUS | Status: DC | PRN
  Administered 2020-12-20: 5 mg via INTRAVENOUS

## 2020-12-20 MED ORDER — BUPIVACAINE HCL (PF) 0.5 % IJ SOLN
INTRAMUSCULAR | Status: DC | PRN
  Administered 2020-12-20: 10 mL

## 2020-12-20 MED ORDER — OXYCODONE HCL 5 MG PO TABS
5.0000 mg | ORAL_TABLET | ORAL | Status: DC | PRN
  Administered 2020-12-20: 10 mg via ORAL
  Administered 2020-12-21: 5 mg via ORAL
  Administered 2020-12-21: 10 mg via ORAL
  Filled 2020-12-20: qty 1
  Filled 2020-12-20 (×2): qty 2

## 2020-12-20 MED ORDER — SUGAMMADEX SODIUM 200 MG/2ML IV SOLN
INTRAVENOUS | Status: DC | PRN
  Administered 2020-12-20: 200 mg via INTRAVENOUS

## 2020-12-20 MED ORDER — DEXAMETHASONE SODIUM PHOSPHATE 10 MG/ML IJ SOLN
INTRAMUSCULAR | Status: DC | PRN
  Administered 2020-12-20: 6 mg via INTRAVENOUS

## 2020-12-20 MED ORDER — LACTATED RINGERS IV SOLN: INTRAVENOUS | Status: DC | PRN

## 2020-12-20 MED ORDER — LIDOCAINE HCL (PF) 2 % IJ SOLN
INTRAMUSCULAR | Status: AC
  Filled 2020-12-20: qty 5

## 2020-12-20 SURGICAL SUPPLY — 47 items
APPLICATOR ARISTA FLEXITIP XL (MISCELLANEOUS) ×1 IMPLANT
APPLIER CLIP ROT 10 11.4 M/L (STAPLE) ×2
BAG RETRIEVAL 10 (BASKET) ×1
BLADE SURG 15 STRL LF DISP TIS (BLADE) ×1 IMPLANT
BLADE SURG 15 STRL SS (BLADE) ×1
CHLORAPREP W/TINT 26 (MISCELLANEOUS) ×2 IMPLANT
CLIP APPLIE ROT 10 11.4 M/L (STAPLE) ×1 IMPLANT
CLOTH BEACON ORANGE TIMEOUT ST (SAFETY) ×2 IMPLANT
COVER LIGHT HANDLE STERIS (MISCELLANEOUS) ×4 IMPLANT
COVER WAND RF STERILE (DRAPES) ×2 IMPLANT
DECANTER SPIKE VIAL GLASS SM (MISCELLANEOUS) ×2 IMPLANT
DERMABOND ADVANCED (GAUZE/BANDAGES/DRESSINGS) ×1
DERMABOND ADVANCED .7 DNX12 (GAUZE/BANDAGES/DRESSINGS) ×1 IMPLANT
ELECT REM PT RETURN 9FT ADLT (ELECTROSURGICAL) ×2
ELECTRODE REM PT RTRN 9FT ADLT (ELECTROSURGICAL) ×1 IMPLANT
GAUZE SPONGE 2X2 12PLY NS (GAUZE/BANDAGES/DRESSINGS) ×4 IMPLANT
GLOVE SURG ENC MOIS LTX SZ6.5 (GLOVE) ×2 IMPLANT
GLOVE SURG UNDER POLY LF SZ6.5 (GLOVE) ×2 IMPLANT
GLOVE SURG UNDER POLY LF SZ7 (GLOVE) ×6 IMPLANT
GOWN STRL REUS W/TWL LRG LVL3 (GOWN DISPOSABLE) ×6 IMPLANT
HEMOSTAT ARISTA ABSORB 3G PWDR (HEMOSTASIS) ×1 IMPLANT
HEMOSTAT SNOW SURGICEL 2X4 (HEMOSTASIS) ×3 IMPLANT
INST SET LAPROSCOPIC AP (KITS) ×2 IMPLANT
IRRIG SUCT STRYKERFLOW 2 WTIP (MISCELLANEOUS) ×2
IRRIGATION SUCT STRKRFLW 2 WTP (MISCELLANEOUS) IMPLANT
IV NS IRRIG 3000ML ARTHROMATIC (IV SOLUTION) ×1 IMPLANT
KIT TURNOVER KIT A (KITS) ×2 IMPLANT
MANIFOLD NEPTUNE II (INSTRUMENTS) ×2 IMPLANT
NDL INSUFFLATION 14GA 120MM (NEEDLE) ×1 IMPLANT
NEEDLE INSUFFLATION 14GA 120MM (NEEDLE) ×2 IMPLANT
NS IRRIG 1000ML POUR BTL (IV SOLUTION) ×2 IMPLANT
PACK LAP CHOLE LZT030E (CUSTOM PROCEDURE TRAY) ×2 IMPLANT
PAD ARMBOARD 7.5X6 YLW CONV (MISCELLANEOUS) ×2 IMPLANT
SET BASIN LINEN APH (SET/KITS/TRAYS/PACK) ×2 IMPLANT
SET TUBE SMOKE EVAC HIGH FLOW (TUBING) ×2 IMPLANT
SLEEVE ENDOPATH XCEL 5M (ENDOMECHANICALS) ×2 IMPLANT
STAPLER VISISTAT (STAPLE) ×1 IMPLANT
SUT MNCRL AB 4-0 PS2 18 (SUTURE) ×4 IMPLANT
SUT VICRYL 0 UR6 27IN ABS (SUTURE) ×2 IMPLANT
SYS BAG RETRIEVAL 10MM (BASKET) ×1
SYSTEM BAG RETRIEVAL 10MM (BASKET) ×1 IMPLANT
TAPE CLOTH SOFT 2X10 (GAUZE/BANDAGES/DRESSINGS) ×1 IMPLANT
TROCAR ENDO BLADELESS 11MM (ENDOMECHANICALS) ×2 IMPLANT
TROCAR XCEL NON-BLD 5MMX100MML (ENDOMECHANICALS) ×2 IMPLANT
TROCAR XCEL UNIV SLVE 11M 100M (ENDOMECHANICALS) ×2 IMPLANT
TUBE CONNECTING 12X1/4 (SUCTIONS) ×2 IMPLANT
WARMER LAPAROSCOPE (MISCELLANEOUS) ×2 IMPLANT

## 2020-12-20 NOTE — Op Note (Signed)
Operative Note   Preoperative Diagnosis: Acute cholecystitis    Postoperative Diagnosis: Acute gangrenous cholecystitis    Procedure(s) Performed: Laparoscopic cholecystectomy   Surgeon: Ria Comment C. Constance Haw, MD   Assistants: Aviva Signs, MD   Anesthesia: General endotracheal   Anesthesiologist: Louann Sjogren, MD    Specimens: Gallbladder    Estimated Blood Loss: Minimal    Blood Replacement: None    Complications: None    Operative Findings: Gangrenous gallbladder with upper lateral wall necrosis and purulent drainage   Procedure: The patient was taken to the operating room and placed supine. General endotracheal anesthesia was induced. Intravenous antibiotics were administered per protocol. An orogastric tube positioned to decompress the stomach. The abdomen was prepared and draped in the usual sterile fashion.    Avoiding the VP shunt catheter, a supraumbilical incision was made and a Veress technique was utilized to achieve pneumoperitoneum to 15 mmHg with carbon dioxide. A 11 mm optiview port was placed through the supraumbilical region, and a 10 mm 0-degree operative laparoscope was introduced. The area underlying the trocar and Veress needle were inspected and without evidence of injury.  The VP shunt was noted entering the peritoneum. Remaining trocars were placed under direct vision and avoided the VP shunt. Two 5 mm ports were placed in the right abdomen, between the anterior axillary and midclavicular line.  A final 11 mm port was placed through the mid-epigastrium, near the falciform ligament.    The gallbladder was decompressed and was necrotic with a gangrenous lateral wall.  The fundus was elevated cephalad and the infundibulum was retracted to the patient's right. The gallbladder/cystic duct junction was skeletonized. The cystic artery noted in the triangle of Calot and was also skeletonized.  We then continued liberal medial and lateral dissection until the critical  view of safety was achieved. The cystic duct appeared to be short as it looked like the common bile duct as being tented up, so cystic duct was doubly clipped and divided at the very edge of the gallbladder to ensure no narrowing of the common bile duct.  The cystic artery was triply clipped and divided.    The gallbladder was then dissected from the liver bed with electrocautery. There was small blood vessels in the hepatic fossa that were cauterized and one that was clipped. The specimen was placed in an Endopouch and was retrieved through the epigastric site.   Final inspection revealed acceptable hemostasis. Arista and Surgical SNOW were placed in the gallbladder bed.  Trocars were removed and pneumoperitoneum was released.  The epigastric and umbilical port sites were smaller than my finger tip. Skin incisions were closed with staples and dressings were placed.  The patient was awakened from anesthesia and extubated without complication.    Curlene Labrum, MD [facility] [address] [place], [place] [address] [phone] (office)

## 2020-12-20 NOTE — Progress Notes (Addendum)
[facility]  Notified wife and team of gangrenous cholecystitis. Plan for antibiotics for 5 days post op, will change to zosyn.  Labs in the AM. Clear diet, PRN pain meds.  VP shunt avoided during surgery, Dr. Zada Finders expected no need for any intervention. He wants to see the patient in 2-3 weeks post op to ensure no issues.    Curlene Labrum, MD [facility] [address] [place], [place] [address] [phone] (office)

## 2020-12-20 NOTE — Progress Notes (Signed)
Pt back to room from PACU s/p lap chole. VSS, pt alert, oriented to person, place, knows he had some type of surgery. IVF infusing without s/s infiltration, condom cath on draining clear yellow urine. Bilateral SCD's on. Lungs CTA, pt with moderate cough effort upon command. Abd soft, slightly distended with active bowel sounds x4. DDI x4 to abd port sites. Pt denies c/o, requested and given soda to drink, tolerated well. Wife remains at bedside, advised both to call for any needs. Stated understanding.

## 2020-12-20 NOTE — Anesthesia Postprocedure Evaluation (Signed)
Anesthesia Post Note  Patient: Jeffrey Wheeler  Procedure(s) Performed: LAPAROSCOPIC CHOLECYSTECTOMY (N/A Abdomen)  Patient location during evaluation: Phase II Anesthesia Type: General Level of consciousness: awake Pain management: pain level controlled Vital Signs Assessment: post-procedure vital signs reviewed and stable Respiratory status: spontaneous breathing and respiratory function stable Cardiovascular status: blood pressure returned to baseline and stable Postop Assessment: no headache and no apparent nausea or vomiting Anesthetic complications: no Comments: Late entry   No complications documented.   Last Vitals:  Vitals:   12/20/20 1509 12/20/20 1525  BP:  (!) 141/75  Pulse:  86  Resp:  18  Temp: 36.8 C   SpO2: 94% 91%    Last Pain:  Vitals:   12/20/20 1525  TempSrc:   PainSc: 0-No pain                 Louann Sjogren

## 2020-12-20 NOTE — Anesthesia Preprocedure Evaluation (Signed)
Anesthesia Evaluation  Patient identified by MRN, date of birth, ID band Patient awake    Reviewed: Allergy & Precautions, H&P , NPO status , Patient's Chart, lab work & pertinent test results, reviewed documented beta blocker date and time   Airway Mallampati: II  TM Distance: >3 FB Neck ROM: full    Dental no notable dental hx.    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  Reproductive/Obstetrics negative OB ROS                             Anesthesia Physical Anesthesia Plan  ASA: III  Anesthesia Plan: General   Post-op Pain Management:    Induction:   PONV Risk Score and Plan: Ondansetron  Airway Management Planned:   Additional Equipment:   Intra-op Plan:   Post-operative Plan:   Informed Consent: I have reviewed the patients History and Physical, chart, labs and discussed the procedure including the risks, benefits and alternatives for the proposed anesthesia with the patient or authorized representative who has indicated his/her understanding and acceptance.     Dental Advisory Given  Plan Discussed with: CRNA  Anesthesia Plan Comments:         Anesthesia Quick Evaluation

## 2020-12-20 NOTE — Transfer of Care (Signed)
Immediate Anesthesia Transfer of Care Note  Patient: Jeffrey Wheeler  Procedure(s) Performed: LAPAROSCOPIC CHOLECYSTECTOMY (N/A Abdomen)  Patient Location: PACU  Anesthesia Type:General  Level of Consciousness: awake and alert   Airway & Oxygen Therapy: Patient Spontanous Breathing and Patient connected to face mask oxygen  Post-op Assessment: Report given to RN and Post -op Vital signs reviewed and stable  Post vital signs: Reviewed and stable  Last Vitals:  Vitals Value Taken Time  BP 159/77 12/20/20 1449  Temp    Pulse 88 12/20/20 1451  Resp 21 12/20/20 1451  SpO2 98 % 12/20/20 1451  Vitals shown include unvalidated device data.  Last Pain:  Vitals:   12/20/20 1305  TempSrc: Oral  PainSc: 0-No pain      Patients Stated Pain Goal: 7 (55/21/74 7159)  Complications: No complications documented.

## 2020-12-20 NOTE — Progress Notes (Signed)
Pt down via stretcher to OR holding for lap cholecystectomy. Wife signed informed consent due to pt confusion. Wife down to surgical waiting area with staff.

## 2020-12-20 NOTE — Anesthesia Procedure Notes (Signed)
Procedure Name: Intubation Date/Time: 12/20/2020 1:46 PM Performed by: Orlie Dakin, CRNA Pre-anesthesia Checklist: Patient identified, Emergency Drugs available, Suction available and Patient being monitored Patient Re-evaluated:Patient Re-evaluated prior to induction Oxygen Delivery Method: Circle system utilized Preoxygenation: Pre-oxygenation with 100% oxygen Induction Type: IV induction Ventilation: Mask ventilation without difficulty Laryngoscope Size: Glidescope and 3 Grade View: Grade I Tube type: Oral Tube size: 7.5 mm Number of attempts: 1 Airway Equipment and Method: Stylet and Video-laryngoscopy Placement Confirmation: ETT inserted through vocal cords under direct vision,  positive ETCO2 and breath sounds checked- equal and bilateral Secured at: 23 cm Tube secured with: Tape Dental Injury: Teeth and Oropharynx as per pre-operative assessment  Comments: Glidescope used due to H/O "difficult airway" and previous Glidescope used.  4x4s bite block used.

## 2020-12-20 NOTE — Progress Notes (Signed)
Pharmacy Antibiotic Note  Jeffrey Wheeler is a 85 y.o. male admitted on 12/18/2020 with gangrenous colitis .  Pharmacy has been consulted for zosyn dosing.  Plan: Zosyn 3.375g IV q8h (4 hour infusion). x 5 days per Dr Constance Haw  Height: 5\' 10"  (177.8 cm) Weight: 100.8 kg (222 lb 3.2 oz) IBW/kg (Calculated) : 73  Temp (24hrs), Avg:98.3 F (36.8 C), Min:98.2 F (36.8 C), Max:98.7 F (37.1 C)  Recent Labs  Lab 12/18/20 1615 12/18/20 2154 12/19/20 0524 12/20/20 0549  WBC 25.3*  --  19.8* 16.7*  CREATININE 1.02  --  0.87 0.88  LATICACIDVEN  --  1.7  --   --     Estimated Creatinine Clearance: 77 mL/min (by C-G formula based on SCr of 0.88 mg/dL).    No Known Allergies  Antimicrobials this admission: 4/6 zosyn >>  4/4 ceftriaxone  >> 4/6?    Microbiology results: 4/4 Resp panel: negative   Thank you for allowing pharmacy to be a part of this patient's care.  Donna Christen Salil Raineri 12/20/2020 3:15 PM

## 2020-12-20 NOTE — Interval H&P Note (Signed)
History and Physical Interval Note:  12/20/2020 1:27 PM  Jeffrey Wheeler  has presented today for surgery, with the diagnosis of acute cholecystitis.  The various methods of treatment have been discussed with the patient and family. After consideration of risks, benefits and other options for treatment, the patient has consented to  Procedure(s): LAPAROSCOPIC CHOLECYSTECTOMY (N/A) as a surgical intervention.  The patient's history has been reviewed, patient examined, no change in status, stable for surgery.  I have reviewed the patient's chart and labs.  Questions were answered to the patient's satisfaction.    No changes. Patient is nervous. Spoke with Dr. Zada Finders via Epic and he is ok with Cholecystectomy at [facility]. No changes in antibiotics from our normal protocol, and said patient has normal pressure hydrocephalus and if any issues with shunt should only get wobbly, confused, and have urinary incontinence.     Virl Cagey

## 2020-12-20 NOTE — Plan of Care (Signed)

## 2020-12-20 NOTE — Progress Notes (Signed)
PROGRESS NOTE    Jeffrey Wheeler  UXN:[id number] DOB: [birth year] DOA: 12/18/2020 PCP: Asencion Noble, MD   Brief Narrative:   [name] a84 y.o.male,With history of scoliosis, prostate disease, hydrocephalus with VP shunt placed Dec 2021, GERD, chronic back pain, and more presents to the ED with a chief complaint of abdominal pain.  Patient was admitted with acute cholecystitis and has been seen by general surgery with plans for laparoscopic cholecystectomy.  Assessment & Plan:   Principal Problem:   Acute cholecystitis Active Problems:   Gastroesophageal reflux disease   Hyponatremia   Acute cholecystitis -Patient has been n.p.o. since midnight -Plan for laparoscopic cholecystectomy per general surgery -Continue to monitor labs -Dietary advancement per general surgery  GERD -PPI  Hyponatremia secondary to decreased p.o. intake and dehydration -Improving -Normal saline monitor  History of BPH -Continue home Cardura   DVT prophylaxis: Heparin Code Status: Full Family Communication: None at bedside Disposition Plan:  Status is: Inpatient  Remains inpatient appropriate because:Ongoing diagnostic testing needed not appropriate for outpatient work up, IV treatments appropriate due to intensity of illness or inability to take PO and Inpatient level of care appropriate due to severity of illness   Dispo: The patient is from: Home              Anticipated d/c is to: Home              Patient currently is not medically stable to d/c.   Difficult to place patient No   Consultants:   General Surgery  Procedures:   See below  Antimicrobials:  Anti-infectives (From admission, onward)   Start     Dose/Rate Route Frequency Ordered Stop   12/20/20 0600  cefoTEtan (CEFOTAN) 2 g in sodium chloride 0.9 % 100 mL IVPB        2 g 200 mL/hr over 30 Minutes Intravenous On call to O.R. 12/19/20 2057 12/21/20 0559   12/19/20 2000  [MAR Hold]  cefTRIAXone (ROCEPHIN) 2 g in  sodium chloride 0.9 % 100 mL IVPB        (MAR Hold since Wed 12/20/2020 at 1304.Hold Reason: Transfer to a Procedural area.)   2 g 200 mL/hr over 30 Minutes Intravenous Every 24 hours 12/18/20 2219     12/18/20 2030  cefTRIAXone (ROCEPHIN) 2 g in sodium chloride 0.9 % 100 mL IVPB        2 g 200 mL/hr over 30 Minutes Intravenous  Once 12/18/20 2026 12/18/20 2200       Subjective: Patient seen and evaluated today with no new acute complaints or concerns. No acute concerns or events noted overnight.  He denies any current abdominal pain, nausea, or vomiting.  He is awaiting laparoscopic cholecystectomy later today.  Objective: Vitals:   12/19/20 1032 12/19/20 1455 12/20/20 0354 12/20/20 1305  BP: 129/71 (!) 148/86 (!) 144/78 (!) 144/73  Pulse: 84 88 91   Resp: 18 17 19  (!) 28  Temp: 99.7 F (37.6 C) 99.3 F (37.4 C) 98.2 F (36.8 C) 98.7 F (37.1 C)  TempSrc: Oral Oral  Oral  SpO2: 95% 94% 94% 94%  Weight:      Height:        Intake/Output Summary (Last 24 hours) at 12/20/2020 1321 Last data filed at 12/20/2020 0345 Gross per 24 hour  Intake 1664.54 ml  Output --  Net 1664.54 ml   Filed Weights   12/18/20 1559 12/18/20 2225  Weight: 99.8 kg 100.8 kg    Examination:  General exam:  Appears calm and comfortable  Respiratory system: Clear to auscultation. Respiratory effort normal. Cardiovascular system: S1 & S2 heard, RRR.  Gastrointestinal system: Abdomen is soft Central nervous system: Alert and awake Extremities: No edema Skin: No significant lesions noted Psychiatry: Flat affect.    Data Reviewed: I have personally reviewed following labs and imaging studies  CBC: Recent Labs  Lab 12/18/20 1615 12/19/20 0524 12/20/20 0549  WBC 25.3* 19.8* 16.7*  NEUTROABS 19.2*  --  11.9*  HGB 14.2 13.6 13.0  HCT 40.2 39.4 38.3*  MCV 91.8 92.7 93.4  PLT 230 208 762   Basic Metabolic Panel: Recent Labs  Lab 12/18/20 1615 12/19/20 0524 12/20/20 0549  NA 128* 130* 133*   K 4.0 3.8 3.5  CL 97* 100 101  CO2 21* 21* 22  GLUCOSE 109* 90 83  BUN 18 14 13   CREATININE 1.02 0.87 0.88  CALCIUM 9.1 8.6* 8.6*  MG  --  1.6*  --    GFR: Estimated Creatinine Clearance: 77 mL/min (by C-G formula based on SCr of 0.88 mg/dL). Liver Function Tests: Recent Labs  Lab 12/18/20 1615 12/19/20 0524 12/20/20 0549  AST 30 21 17   ALT 24 19 17   ALKPHOS 95 91 97  BILITOT 1.4* 1.0 1.0  PROT 6.9 5.8* 5.7*  ALBUMIN 3.6 3.0* 2.8*   Recent Labs  Lab 12/18/20 1615  LIPASE 18   No results for input(s): AMMONIA in the last 168 hours. Coagulation Profile: No results for input(s): INR, PROTIME in the last 168 hours. Cardiac Enzymes: No results for input(s): CKTOTAL, CKMB, CKMBINDEX, TROPONINI in the last 168 hours. BNP (last 3 results) No results for input(s): PROBNP in the last 8760 hours. HbA1C: No results for input(s): HGBA1C in the last 72 hours. CBG: No results for input(s): GLUCAP in the last 168 hours. Lipid Profile: No results for input(s): CHOL, HDL, LDLCALC, TRIG, CHOLHDL, LDLDIRECT in the last 72 hours. Thyroid Function Tests: No results for input(s): TSH, T4TOTAL, FREET4, T3FREE, THYROIDAB in the last 72 hours. Anemia Panel: No results for input(s): VITAMINB12, FOLATE, FERRITIN, TIBC, IRON, RETICCTPCT in the last 72 hours. Sepsis Labs: Recent Labs  Lab 12/18/20 2154  LATICACIDVEN 1.7    Recent Results (from the past 240 hour(s))  Resp Panel by RT-PCR (Flu A&B, Covid) Nasopharyngeal Swab     Status: None   Collection Time: 12/18/20  9:15 PM   Specimen: Nasopharyngeal Swab; Nasopharyngeal(NP) swabs in vial transport medium  Result Value Ref Range Status   SARS Coronavirus 2 by RT PCR NEGATIVE NEGATIVE Final    Comment: (NOTE) SARS-CoV-2 target nucleic acids are NOT DETECTED.  The SARS-CoV-2 RNA is generally detectable in upper respiratory specimens during the acute phase of infection. The lowest concentration of SARS-CoV-2 viral copies this assay  can detect is 138 copies/mL. A negative result does not preclude SARS-Cov-2 infection and should not be used as the sole basis for treatment or other patient management decisions. A negative result may occur with  improper specimen collection/handling, submission of specimen other than nasopharyngeal swab, presence of viral mutation(s) within the areas targeted by this assay, and inadequate number of viral copies(<138 copies/mL). A negative result must be combined with clinical observations, patient history, and epidemiological information. The expected result is Negative.  Fact Sheet for Patients:  EntrepreneurPulse.com.au  Fact Sheet for Healthcare Providers:  IncredibleEmployment.be  This test is no t yet approved or cleared by the [place] FDA and  has been authorized for detection and/or diagnosis of SARS-CoV-2  by FDA under an Emergency Use Authorization (EUA). This EUA will remain  in effect (meaning this test can be used) for the duration of the COVID-19 declaration under Section 564(b)(1) of the Act, 21 U.S.C.section 360bbb-3(b)(1), unless the authorization is terminated  or revoked sooner.       Influenza A by PCR NEGATIVE NEGATIVE Final   Influenza B by PCR NEGATIVE NEGATIVE Final    Comment: (NOTE) The Xpert Xpress SARS-CoV-2/FLU/RSV plus assay is intended as an aid in the diagnosis of influenza from Nasopharyngeal swab specimens and should not be used as a sole basis for treatment. Nasal washings and aspirates are unacceptable for Xpert Xpress SARS-CoV-2/FLU/RSV testing.  Fact Sheet for Patients: EntrepreneurPulse.com.au  Fact Sheet for Healthcare Providers: IncredibleEmployment.be  This test is not yet approved or cleared by the [place] FDA and has been authorized for detection and/or diagnosis of SARS-CoV-2 by FDA under an Emergency Use Authorization (EUA). This EUA will  remain in effect (meaning this test can be used) for the duration of the COVID-19 declaration under Section 564(b)(1) of the Act, 21 U.S.C. section 360bbb-3(b)(1), unless the authorization is terminated or revoked.  Performed at [facility], [address] [place], [place] [address]          Radiology Studies: US Abdomen Complete  Result Date: 12/19/2020 CLINICAL DATA:  Pain.  Follow-up CT 12/18/2020. EXAM: ABDOMEN ULTRASOUND COMPLETE COMPARISON:  CT 12/18/2020. FINDINGS: Gallbladder: Sludge noted within the gallbladder. Tiny gallstones cannot be excluded. Echogenic foci within the gallbladder wall and within the gallbladder lumen could represent air. Gallbladder wall is thickened at 3.9 mm. Cholecystitis including emphysematous cholecystitis could present in this fashion. Negative Murphy sign. Common bile duct: Diameter: 4.8 mm Liver: Increased echogenicity consistent fatty infiltration or hepatocellular disease. 1.4 cm simple cyst again noted. Portal vein is patent on color Doppler imaging with normal direction of blood flow towards the liver. IVC: No abnormality visualized. Pancreas: Poorly visualized due to overlying bowel gas. Spleen: Size and appearance within normal limits. Right Kidney: Length: 12.0 cm. Echogenicity within normal limits. No mass or hydronephrosis visualized. Left Kidney: Length: 14.0 cm. Renal size discrepancy most likely related to slight left renal rotation. Echogenicity within normal limits. No mass or hydronephrosis visualized. Abdominal aorta: No aneurysm visualized. Other findings: Very limited study due to patient's body habitus and bowel gas. IMPRESSION: 1. Sludge noted within the gall bladder. Tiny gallstones cannot be excluded. Echogenic foci within the gallbladder wall and within the gallbladder lumen could represent air. Gallbladder wall is thickened at 3.9 mm. Cholecystitis including emphysematous cholecystitis could present in this fashion. 2. Increased hepatic  echogenicity consistent fatty infiltration or hepatocellular disease. These results will be called to the ordering clinician or representative by the Radiologist Assistant, and communication documented in the PACS or [employer]. Electronically Signed   By: Marcello Moores  Register   On: 12/19/2020 11:37   CT ABDOMEN PELVIS W CONTRAST  Result Date: 12/18/2020 CLINICAL DATA:  Abdominal pain EXAM: CT ABDOMEN AND PELVIS WITH CONTRAST TECHNIQUE: Multidetector CT imaging of the abdomen and pelvis was performed using the standard protocol following bolus administration of intravenous contrast. CONTRAST:  120mL OMNIPAQUE IOHEXOL 300 MG/ML  SOLN COMPARISON:  None. FINDINGS: Lower chest: Trace bilateral pleural effusions. Bibasilar atelectasis. Heart is normal size. Small hiatal hernia. Hepatobiliary: Stranding noted around the gallbladder. Layering gallstone noted in the gallbladder near the gallbladder neck. Findings concerning for possible acute cholecystitis. Small cyst in the right hepatic dome. No suspicious hepatic abnormality. Pancreas: No  focal abnormality or ductal dilatation. Spleen: No focal abnormality.  Normal size. Adrenals/Urinary Tract: Nodule in the right adrenal gland measures 1.5 cm. Left adrenal gland and kidneys unremarkable. Urinary bladder unremarkable. Stomach/Bowel: Stomach, large and small bowel grossly unremarkable. Vascular/Lymphatic: Aortic atherosclerosis. No evidence of aneurysm or adenopathy. Reproductive: Radiation. Seeds in the region of the prostate which is mildly prominent Other: No free fluid or free air. Musculoskeletal: No acute and degenerative bony abnormality changes in the lumbar spine. Scoliosis IMPRESSION: Cholelithiasis. Stranding noted around the gallbladder concerning for acute cholecystitis. Trace bilateral pleural effusions.  Bibasilar atelectasis. Small hiatal hernia. Aortic atherosclerosis. Electronically Signed   By: Rolm Baptise M.D.   On: 12/18/2020 19:09         Scheduled Meds: . [MAR Hold] dorzolamide-timolol  1 drop Both Eyes BID  . [MAR Hold] doxazosin  8 mg Oral QHS  . [MAR Hold] gabapentin  100 mg Oral QHS  . [MAR Hold] heparin  5,000 Units Subcutaneous Q8H  . [MAR Hold] latanoprost  1 drop Both Eyes QHS  . [MAR Hold] pantoprazole (PROTONIX) IV  40 mg Intravenous Q24H  . [MAR Hold] vitamin B-12  1,000 mcg Oral QHS   Continuous Infusions: . sodium chloride 75 mL/hr at 12/20/20 1317  . cefoTEtan (CEFOTAN) IV    . [MAR Hold] cefTRIAXone (ROCEPHIN)  IV Stopped (12/19/20 2045)     LOS: 2 days    Time spent: 35 minutes    Suhani Stillion D Manuella Ghazi, DO [facility]  If 7PM-7AM, please contact night-coverage [email or web address] 12/20/2020, 1:21 PM

## 2020-12-21 ENCOUNTER — Encounter (HOSPITAL_COMMUNITY): Payer: Self-pay | Admitting: General Surgery

## 2020-12-21 DIAGNOSIS — K810 Acute cholecystitis: Secondary | ICD-10-CM | POA: Diagnosis not present

## 2020-12-21 LAB — CBC WITH DIFFERENTIAL/PLATELET
Abs Immature Granulocytes: 0.07 10*3/uL (ref 0.00–0.07)
Basophils Absolute: 0 10*3/uL (ref 0.0–0.1)
Basophils Relative: 0 %
Eosinophils Absolute: 0.1 10*3/uL (ref 0.0–0.5)
Eosinophils Relative: 1 %
HCT: 36.8 % — ABNORMAL LOW (ref 39.0–52.0)
Hemoglobin: 12.8 g/dL — ABNORMAL LOW (ref 13.0–17.0)
Immature Granulocytes: 1 %
Lymphocytes Relative: 17 %
Lymphs Abs: 2.6 10*3/uL (ref 0.7–4.0)
MCH: 32.2 pg (ref 26.0–34.0)
MCHC: 34.8 g/dL (ref 30.0–36.0)
MCV: 92.5 fL (ref 80.0–100.0)
Monocytes Absolute: 1 10*3/uL (ref 0.1–1.0)
Monocytes Relative: 6 %
Neutro Abs: 11.2 10*3/uL — ABNORMAL HIGH (ref 1.7–7.7)
Neutrophils Relative %: 75 %
Platelets: 296 10*3/uL (ref 150–400)
RBC: 3.98 MIL/uL — ABNORMAL LOW (ref 4.22–5.81)
RDW: 14 % (ref 11.5–15.5)
WBC: 14.9 10*3/uL — ABNORMAL HIGH (ref 4.0–10.5)
nRBC: 0 % (ref 0.0–0.2)

## 2020-12-21 LAB — COMPREHENSIVE METABOLIC PANEL
ALT: 18 U/L (ref 0–44)
AST: 17 U/L (ref 15–41)
Albumin: 2.5 g/dL — ABNORMAL LOW (ref 3.5–5.0)
Alkaline Phosphatase: 96 U/L (ref 38–126)
Anion gap: 10 (ref 5–15)
BUN: 19 mg/dL (ref 8–23)
CO2: 21 mmol/L — ABNORMAL LOW (ref 22–32)
Calcium: 8.7 mg/dL — ABNORMAL LOW (ref 8.9–10.3)
Chloride: 103 mmol/L (ref 98–111)
Creatinine, Ser: 0.91 mg/dL (ref 0.61–1.24)
GFR, Estimated: >60 mL/min (ref >60)
Glucose, Bld: 122 mg/dL — ABNORMAL HIGH (ref 70–99)
Potassium: 3.8 mmol/L (ref 3.5–5.1)
Sodium: 134 mmol/L — ABNORMAL LOW (ref 135–145)
Total Bilirubin: 0.9 mg/dL (ref 0.3–1.2)
Total Protein: 5.6 g/dL — ABNORMAL LOW (ref 6.5–8.1)

## 2020-12-21 LAB — MAGNESIUM: Magnesium: 1.9 mg/dL (ref 1.7–2.4)

## 2020-12-21 MED ORDER — DOCUSATE SODIUM 100 MG PO CAPS
100.0000 mg | ORAL_CAPSULE | Freq: Two times a day (BID) | ORAL | Status: DC
  Administered 2020-12-21 – 2020-12-24 (×6): 100 mg via ORAL
  Filled 2020-12-21 (×7): qty 1

## 2020-12-21 MED ORDER — DOCUSATE SODIUM 100 MG PO CAPS: 100.0000 mg | ORAL_CAPSULE | Freq: Two times a day (BID) | ORAL | Status: DC

## 2020-12-21 NOTE — Progress Notes (Signed)
PROGRESS NOTE    Jeffrey Wheeler  OZH:[id number] DOB: 10/03/1935 DOA: 12/18/2020 PCP: Asencion Noble, MD   Brief Narrative:   [name] a84 y.o.male,With history of scoliosis, prostate disease, hydrocephalus with VP shunt placed Dec 2021, GERD, chronic back pain, and more presents to the ED with a chief complaint of abdominal pain.  Patient was admitted with acute cholecystitis and is undergoing laparoscopic cholecystectomy on 4/6 with noted gangrenous cholecystitis.  Currently remains on IV Zosyn  Assessment & Plan:   Principal Problem:   Acute cholecystitis Active Problems:   Gastroesophageal reflux disease   Hyponatremia   Gangrenous cholecystitis   Acute cholecystitis status post laparoscopic cholecystectomy 4/6 -Started on clear liquids with further advancement per general surgery -Continue on IV Zosyn for now given gangrenous cholecystitis with plans for total 5 days of antibiotics -May transition to oral antibiotics once diet was further advanced -Continue to monitor labs -PT evaluation for ambulation  GERD -PPI  Hyponatremia secondary to decreased p.o. intake and dehydration -Improved -Continue to monitor off IV fluid now that he is on clear liquids  History of BPH -Continue home Cardura   DVT prophylaxis: Heparin Code Status: Full Family Communication:  Wife at bedside 4/7 Disposition Plan:  Status is: Inpatient  Remains inpatient appropriate because:Ongoing diagnostic testing needed not appropriate for outpatient work up, IV treatments appropriate due to intensity of illness or inability to take PO and Inpatient level of care appropriate due to severity of illness   Dispo: The patient is from: Home  Anticipated d/c is to: Home  Patient currently is not medically stable to d/c.              Difficult to place patient No   Consultants:   General Surgery  Procedures:   Laparoscopic cholecystectomy 4/6  See  below  Antimicrobials:  Anti-infectives (From admission, onward)   Start     Dose/Rate Route Frequency Ordered Stop   12/20/20 2200  piperacillin-tazobactam (ZOSYN) IVPB 3.375 g        3.375 g 12.5 mL/hr over 240 Minutes Intravenous Every 8 hours 12/20/20 1513 12/25/20 2159   12/20/20 0600  cefoTEtan (CEFOTAN) 2 g in sodium chloride 0.9 % 100 mL IVPB        2 g 200 mL/hr over 30 Minutes Intravenous On call to O.R. 12/19/20 2057 12/20/20 1408   12/19/20 2000  cefTRIAXone (ROCEPHIN) 2 g in sodium chloride 0.9 % 100 mL IVPB  Status:  Discontinued        2 g 200 mL/hr over 30 Minutes Intravenous Every 24 hours 12/18/20 2219 12/20/20 1648   12/18/20 2030  cefTRIAXone (ROCEPHIN) 2 g in sodium chloride 0.9 % 100 mL IVPB        2 g 200 mL/hr over 30 Minutes Intravenous  Once 12/18/20 2026 12/18/20 2200       Subjective: Patient seen and evaluated today with no new acute complaints or concerns. No acute concerns or events noted overnight.  He states he is feeling well and is passing some flatus.  He has not had a bowel movement.  He denies significant abdominal pain and is tolerating his clear liquid diet.  Objective: Vitals:   12/20/20 1525 12/20/20 2011 12/21/20 0101 12/21/20 0441  BP: (!) 141/75 (!) 160/82 101/65 (!) 153/93  Pulse: 86 96 67 81  Resp: 18 20 16 20   Temp:  (!) 97.5 F (36.4 C) 97.7 F (36.5 C) (!) 97.4 F (36.3 C)  TempSrc:  Oral Oral   SpO2:  91% 92% 95% 95%  Weight:      Height:        Intake/Output Summary (Last 24 hours) at 12/21/2020 1120 Last data filed at 12/21/2020 0500 Gross per 24 hour  Intake 1290 ml  Output 350 ml  Net 940 ml   Filed Weights   12/18/20 1559 12/18/20 2225  Weight: 99.8 kg 100.8 kg    Examination:  General exam: Appears calm and comfortable  Respiratory system: Clear to auscultation. Respiratory effort normal. Cardiovascular system: S1 & S2 heard, RRR.  Gastrointestinal system: Abdomen is soft, bowel sounds appreciated and  incisions are clean dry and intact Central nervous system: Alert and awake Extremities: No edema Skin: No significant lesions noted Psychiatry: Flat affect.    Data Reviewed: I have personally reviewed following labs and imaging studies  CBC: Recent Labs  Lab 12/18/20 1615 12/19/20 0524 12/20/20 0549 12/21/20 0657  WBC 25.3* 19.8* 16.7* 14.9*  NEUTROABS 19.2*  --  11.9* 11.2*  HGB 14.2 13.6 13.0 12.8*  HCT 40.2 39.4 38.3* 36.8*  MCV 91.8 92.7 93.4 92.5  PLT 230 208 237 102   Basic Metabolic Panel: Recent Labs  Lab 12/18/20 1615 12/19/20 0524 12/20/20 0549 12/21/20 0657  NA 128* 130* 133* 134*  K 4.0 3.8 3.5 3.8  CL 97* 100 101 103  CO2 21* 21* 22 21*  GLUCOSE 109* 90 83 122*  BUN 18 14 13 19   CREATININE 1.02 0.87 0.88 0.91  CALCIUM 9.1 8.6* 8.6* 8.7*  MG  --  1.6*  --  1.9   GFR: Estimated Creatinine Clearance: 74.4 mL/min (by C-G formula based on SCr of 0.91 mg/dL). Liver Function Tests: Recent Labs  Lab 12/18/20 1615 12/19/20 0524 12/20/20 0549 12/21/20 0657  AST 30 21 17 17   ALT 24 19 17 18   ALKPHOS 95 91 97 96  BILITOT 1.4* 1.0 1.0 0.9  PROT 6.9 5.8* 5.7* 5.6*  ALBUMIN 3.6 3.0* 2.8* 2.5*   Recent Labs  Lab 12/18/20 1615  LIPASE 18   No results for input(s): AMMONIA in the last 168 hours. Coagulation Profile: No results for input(s): INR, PROTIME in the last 168 hours. Cardiac Enzymes: No results for input(s): CKTOTAL, CKMB, CKMBINDEX, TROPONINI in the last 168 hours. BNP (last 3 results) No results for input(s): PROBNP in the last 8760 hours. HbA1C: No results for input(s): HGBA1C in the last 72 hours. CBG: No results for input(s): GLUCAP in the last 168 hours. Lipid Profile: No results for input(s): CHOL, HDL, LDLCALC, TRIG, CHOLHDL, LDLDIRECT in the last 72 hours. Thyroid Function Tests: No results for input(s): TSH, T4TOTAL, FREET4, T3FREE, THYROIDAB in the last 72 hours. Anemia Panel: No results for input(s): VITAMINB12, FOLATE,  FERRITIN, TIBC, IRON, RETICCTPCT in the last 72 hours. Sepsis Labs: Recent Labs  Lab 12/18/20 2154  LATICACIDVEN 1.7    Recent Results (from the past 240 hour(s))  Resp Panel by RT-PCR (Flu A&B, Covid) Nasopharyngeal Swab     Status: None   Collection Time: 12/18/20  9:15 PM   Specimen: Nasopharyngeal Swab; Nasopharyngeal(NP) swabs in vial transport medium  Result Value Ref Range Status   SARS Coronavirus 2 by RT PCR NEGATIVE NEGATIVE Final    Comment: (NOTE) SARS-CoV-2 target nucleic acids are NOT DETECTED.  The SARS-CoV-2 RNA is generally detectable in upper respiratory specimens during the acute phase of infection. The lowest concentration of SARS-CoV-2 viral copies this assay can detect is 138 copies/mL. A negative result does not preclude SARS-Cov-2 infection and should  not be used as the sole basis for treatment or other patient management decisions. A negative result may occur with  improper specimen collection/handling, submission of specimen other than nasopharyngeal swab, presence of viral mutation(s) within the areas targeted by this assay, and inadequate number of viral copies(<138 copies/mL). A negative result must be combined with clinical observations, patient history, and epidemiological information. The expected result is Negative.  Fact Sheet for Patients:  EntrepreneurPulse.com.au  Fact Sheet for Healthcare Providers:  IncredibleEmployment.be  This test is no t yet approved or cleared by the [place] FDA and  has been authorized for detection and/or diagnosis of SARS-CoV-2 by FDA under an Emergency Use Authorization (EUA). This EUA will remain  in effect (meaning this test can be used) for the duration of the COVID-19 declaration under Section 564(b)(1) of the Act, 21 U.S.C.section 360bbb-3(b)(1), unless the authorization is terminated  or revoked sooner.       Influenza A by PCR NEGATIVE NEGATIVE Final    Influenza B by PCR NEGATIVE NEGATIVE Final    Comment: (NOTE) The Xpert Xpress SARS-CoV-2/FLU/RSV plus assay is intended as an aid in the diagnosis of influenza from Nasopharyngeal swab specimens and should not be used as a sole basis for treatment. Nasal washings and aspirates are unacceptable for Xpert Xpress SARS-CoV-2/FLU/RSV testing.  Fact Sheet for Patients: EntrepreneurPulse.com.au  Fact Sheet for Healthcare Providers: IncredibleEmployment.be  This test is not yet approved or cleared by the [place] FDA and has been authorized for detection and/or diagnosis of SARS-CoV-2 by FDA under an Emergency Use Authorization (EUA). This EUA will remain in effect (meaning this test can be used) for the duration of the COVID-19 declaration under Section 564(b)(1) of the Act, 21 U.S.C. section 360bbb-3(b)(1), unless the authorization is terminated or revoked.  Performed at [facility], [address], [place], [place] [address]          Radiology Studies: No results found.      Scheduled Meds: . dorzolamide-timolol  1 drop Both Eyes BID  . doxazosin  8 mg Oral QHS  . gabapentin  100 mg Oral QHS  . heparin  5,000 Units Subcutaneous Q8H  . latanoprost  1 drop Both Eyes QHS  . pantoprazole (PROTONIX) IV  40 mg Intravenous Q24H  . vitamin B-12  1,000 mcg Oral QHS   Continuous Infusions: . piperacillin-tazobactam (ZOSYN)  IV 3.375 g (12/21/20 0604)     LOS: 3 days    Time spent: 60 minutes    Brooklee Michelin Darleen Crocker, DO [facility]  If 7PM-7AM, please contact night-coverage [email or web address] 12/21/2020, 11:20 AM

## 2020-12-21 NOTE — Plan of Care (Signed)
  Problem: Acute Rehab PT Goals(only PT should resolve) Goal: Pt Will Go Supine/Side To Sit Description: Mod I  Flowsheets (Taken 12/21/2020 1238) Pt will go Supine/Side to Sit: with modified independence Goal: Pt Will Go Sit To Supine/Side Flowsheets (Taken 12/21/2020 1238) Pt will go Sit to Supine/Side: with modified independence Goal: Patient Will Transfer Sit To/From Stand Flowsheets (Taken 12/21/2020 1238) Patient will transfer sit to/from stand: with modified independence Goal: Pt Will Ambulate Flowsheets (Taken 12/21/2020 1238) Pt will Ambulate:  50 feet  with modified independence  with rolling walker

## 2020-12-21 NOTE — Evaluation (Signed)
Physical Therapy Evaluation Patient Details Name: Jeffrey Wheeler MRN: [id number] DOB: 1936-08-05 Today's Date: 12/21/2020   History of Present Illness  Jeffrey Wheeler is a 85 y.o. male with prostate disease, hydrocephalus s/p VP shunt with Dr. Zada Finders 08/2020, chronic back pain, who presented with 5 days of abdominal pain and associated nausea and vomiting. This all peaked on Saturday and he ha a CT a/p done at [facility] and this was reported to be negative and he was sent home.  He says the pain is severe in the RUQ and constant in nature now.He was sent to the ED by Dr. Willey Blade.  He says he has a history of chronic constipation. He has never had any cardiac issues.  Clinical Impression  Mr. Kuwahara is very agreeable to therapy.  He states that he has had difficulty coming from sit to stand for some time now and was in OP therapy.  At this time I feel it would be safer to have [facility] therapy for a few weeks until he is stronger and then resume his OP PT     Follow Up Recommendations Home health PT    Equipment Recommendations  None recommended by PT    Recommendations for Other Services       Precautions / Restrictions Precautions Precautions: Fall Restrictions Weight Bearing Restrictions: No      Mobility  Bed Mobility Overal bed mobility: Modified Independent;Needs Assistance Bed Mobility: Supine to Sit     Supine to sit: Min assist          Transfers Overall transfer level: Needs assistance Equipment used: Rolling walker (2 wheeled) Transfers: Sit to/from Stand Sit to Stand: Mod assist         General transfer comment: sit to stand completed 3 times for strengthening  Ambulation/Gait Ambulation/Gait assistance: Min guard Gait Distance (Feet): 10 Feet (x2) Assistive device: Rolling walker (2 wheeled) Gait Pattern/deviations: Decreased stride length   Gait velocity interpretation: <1.31 ft/sec, indicative of household ambulator              Pertinent  Vitals/Pain Pain Assessment: 0-10 Pain Score: 2  Pain Descriptors / Indicators: Aching Pain Intervention(s): Limited activity within patient's tolerance    Home Living Family/patient expects to be discharged to:: Private residence   Available Help at Discharge: Family;Available 24 hours/day Type of Home: House Home Access: Ramped entrance     Home Layout: Laundry or work area in basement;Two level Home Equipment: Walker - 2 wheels;Walker - standard;Bedside commode;Shower seat;Grab bars - toilet;Grab bars - tub/shower      Prior Function Level of Independence: Needs assistance   Gait / Transfers Assistance Needed: Use of walking stick in home and community dwellings. Chronic back pain making long-distance ambulation difficult.  ADL's / Homemaking Assistance Needed: PRN assist from wife 2/2 unsteadiness  Comments: pt reports ambulating household without assistive device and limited community distances with use of RW     Hand Dominance   Dominant Hand: Right    Extremity/Trunk Assessment        Lower Extremity Assessment Lower Extremity Assessment: Generalized weakness       Communication   Communication: No difficulties  Cognition Arousal/Alertness: Awake/alert                                               Exercises General Exercises - Lower Extremity Ankle Circles/Pumps: Both;10  reps Long Arc Quad: Both;10 reps Heel Slides: Both;10 reps Mini-Sqauts: Both;5 reps   Assessment/Plan    PT Assessment Patient needs continued PT services  PT Problem List Decreased strength;Decreased activity tolerance;Decreased balance;Pain       PT Treatment Interventions Gait training;Functional mobility training;Therapeutic activities;Therapeutic exercise    PT Goals (Current goals can be found in the Care Plan section)  Acute Rehab PT Goals Patient Stated Goal: Go home PT Goal Formulation: With patient/family Time For Goal Achievement:  12/25/20 Potential to Achieve Goals: Good    Frequency Min 3X/week           AM-PAC PT "6 Clicks" Mobility  Outcome Measure Help needed turning from your back to your side while in a flat bed without using bedrails?: A Shedd Help needed moving from lying on your back to sitting on the side of a flat bed without using bedrails?: A Gripp Help needed moving to and from a bed to a chair (including a wheelchair)?: A Chagnon Help needed standing up from a chair using your arms (e.g., wheelchair or bedside chair)?: A Mersch Help needed to walk in hospital room?: A Bodie Help needed climbing 3-5 steps with a railing? : A Lot 6 Click Score: 17    End of Session Equipment Utilized During Treatment: Gait belt Activity Tolerance: Patient tolerated treatment well Patient left: in chair;with call bell/phone within reach;with family/visitor present Nurse Communication: Mobility status PT Visit Diagnosis: Unsteadiness on feet (R26.81);Muscle weakness (generalized) (M62.81)    Time: 3149-7026 PT Time Calculation (min) (ACUTE ONLY): 45 min   Charges:   PT Evaluation $PT Eval Moderate Complexity: 1 Mod PT Treatments $Therapeutic Exercise: 8-22 mins      Rayetta Humphrey, PT CLT [phone] 12/21/2020, 12:41 PM

## 2020-12-21 NOTE — Progress Notes (Signed)
[facility] Progress Note  1 Day Post-Op  Subjective: Improving pain. Wanting something to drink.   Objective: Vital signs in last 24 hours: Temp:  [97.4 F (36.3 C)-97.7 F (36.5 C)] 97.5 F (36.4 C) (04/07 1410) Pulse Rate:  [67-96] 70 (04/07 1410) Resp:  [16-20] 20 (04/07 0441) BP: (101-160)/(65-93) 131/86 (04/07 1410) SpO2:  [91 %-95 %] 95 % (04/07 1410) Last BM Date:  (pta)  Intake/Output from previous day: 04/06 0701 - 04/07 0700 In: 1290 [P.O.:240; I.V.:900; IV Piggyback:150] Out: 350 [Urine:350] Intake/Output this shift: Total I/O In: 840 [P.O.:840] Out: 300 [Urine:300]  General appearance: alert, cooperative and no distress Resp: normal work of breathing GI: soft, distended, mildly tender, dressing in place  Lab Results:  Recent Labs    12/20/20 0549 12/21/20 0657  WBC 16.7* 14.9*  HGB 13.0 12.8*  HCT 38.3* 36.8*  PLT 237 296   BMET Recent Labs    12/20/20 0549 12/21/20 0657  NA 133* 134*  K 3.5 3.8  CL 101 103  CO2 22 21*  GLUCOSE 83 122*  BUN 13 19  CREATININE 0.88 0.91  CALCIUM 8.6* 8.7*   Anti-infectives: Anti-infectives (From admission, onward)   Start     Dose/Rate Route Frequency Ordered Stop   12/20/20 2200  piperacillin-tazobactam (ZOSYN) IVPB 3.375 g        3.375 g 12.5 mL/hr over 240 Minutes Intravenous Every 8 hours 12/20/20 1513 12/25/20 2159   12/20/20 0600  cefoTEtan (CEFOTAN) 2 g in sodium chloride 0.9 % 100 mL IVPB        2 g 200 mL/hr over 30 Minutes Intravenous On call to O.R. 12/19/20 2057 12/20/20 1408   12/19/20 2000  cefTRIAXone (ROCEPHIN) 2 g in sodium chloride 0.9 % 100 mL IVPB  Status:  Discontinued        2 g 200 mL/hr over 30 Minutes Intravenous Every 24 hours 12/18/20 2219 12/20/20 1648   12/18/20 2030  cefTRIAXone (ROCEPHIN) 2 g in sodium chloride 0.9 % 100 mL IVPB        2 g 200 mL/hr over 30 Minutes Intravenous  Once 12/18/20 2026 12/18/20 2200      Assessment/Plan: Jeffrey Wheeler is an  85 yo s/p lap chole for gangrenous cholecystitis. Doing much better.  PRN for pain IS, OOB Will increase to full liquids No BM in 1 week, colace added today, will do miralax tomorrow IV zosyn for 5 days post op for gangrenous cholecystitis SCDs, heparin    LOS: 3 days    Jeffrey Wheeler 12/21/2020

## 2020-12-22 DIAGNOSIS — K810 Acute cholecystitis: Secondary | ICD-10-CM | POA: Diagnosis not present

## 2020-12-22 LAB — CBC
HCT: 35.6 % — ABNORMAL LOW (ref 39.0–52.0)
Hemoglobin: 12.1 g/dL — ABNORMAL LOW (ref 13.0–17.0)
MCH: 31.6 pg (ref 26.0–34.0)
MCHC: 34 g/dL (ref 30.0–36.0)
MCV: 93 fL (ref 80.0–100.0)
Platelets: 330 10*3/uL (ref 150–400)
RBC: 3.83 MIL/uL — ABNORMAL LOW (ref 4.22–5.81)
RDW: 13.9 % (ref 11.5–15.5)
WBC: 14.3 10*3/uL — ABNORMAL HIGH (ref 4.0–10.5)
nRBC: 0 % (ref 0.0–0.2)

## 2020-12-22 LAB — BASIC METABOLIC PANEL
Anion gap: 8 (ref 5–15)
BUN: 19 mg/dL (ref 8–23)
CO2: 25 mmol/L (ref 22–32)
Calcium: 8.8 mg/dL — ABNORMAL LOW (ref 8.9–10.3)
Chloride: 101 mmol/L (ref 98–111)
Creatinine, Ser: 0.94 mg/dL (ref 0.61–1.24)
GFR, Estimated: >60 mL/min (ref >60)
Glucose, Bld: 108 mg/dL — ABNORMAL HIGH (ref 70–99)
Potassium: 3.4 mmol/L — ABNORMAL LOW (ref 3.5–5.1)
Sodium: 134 mmol/L — ABNORMAL LOW (ref 135–145)

## 2020-12-22 LAB — MAGNESIUM: Magnesium: 1.8 mg/dL (ref 1.7–2.4)

## 2020-12-22 LAB — SURGICAL PATHOLOGY

## 2020-12-22 MED ORDER — POTASSIUM CHLORIDE CRYS ER 20 MEQ PO TBCR
40.0000 meq | EXTENDED_RELEASE_TABLET | Freq: Once | ORAL | Status: AC
  Administered 2020-12-22: 40 meq via ORAL
  Filled 2020-12-22: qty 2

## 2020-12-22 MED ORDER — TRAMADOL HCL 50 MG PO TABS
50.0000 mg | ORAL_TABLET | Freq: Four times a day (QID) | ORAL | Status: DC | PRN
  Administered 2020-12-22 – 2020-12-23 (×2): 50 mg via ORAL
  Filled 2020-12-22 (×2): qty 1

## 2020-12-22 MED ORDER — POLYETHYLENE GLYCOL 3350 17 G PO PACK
17.0000 g | PACK | Freq: Every day | ORAL | Status: DC
  Administered 2020-12-22 – 2020-12-24 (×3): 17 g via ORAL
  Filled 2020-12-22 (×3): qty 1

## 2020-12-22 NOTE — Progress Notes (Signed)
[facility] Progress Note  2 Days Post-Op  Subjective: Some confusion/ delirium. Reorients. Distended and sore.   Objective: Vital signs in last 24 hours: Temp:  [97.1 F (36.2 C)-97.5 F (36.4 C)] 97.4 F (36.3 C) (04/08 0547) Pulse Rate:  [60-70] 69 (04/08 1302) Resp:  [14-20] 16 (04/08 1302) BP: (100-143)/(61-86) 100/61 (04/08 1302) SpO2:  [94 %-97 %] 97 % (04/08 1302) Last BM Date:  (pta)  Intake/Output from previous day: 04/07 0701 - 04/08 0700 In: 1730 [P.O.:1590; IV Piggyback:140] Out: 850 [Urine:850] Intake/Output this shift: Total I/O In: 240 [P.O.:240] Out: -   General appearance: alert and no distress GI: soft, distended, appropriately tender, staples c/d/i without erythema or drainage  Lab Results:  Recent Labs    12/21/20 0657 12/22/20 0506  WBC 14.9* 14.3*  HGB 12.8* 12.1*  HCT 36.8* 35.6*  PLT 296 330   BMET Recent Labs    12/21/20 0657 12/22/20 0506  NA 134* 134*  K 3.8 3.4*  CL 103 101  CO2 21* 25  GLUCOSE 122* 108*  BUN 19 19  CREATININE 0.91 0.94  CALCIUM 8.7* 8.8*   PT/INR No results for input(s): LABPROT, INR in the last 72 hours.  Studies/Results: No results found.  Anti-infectives: Anti-infectives (From admission, onward)   Start     Dose/Rate Route Frequency Ordered Stop   12/20/20 2200  piperacillin-tazobactam (ZOSYN) IVPB 3.375 g        3.375 g 12.5 mL/hr over 240 Minutes Intravenous Every 8 hours 12/20/20 1513 12/25/20 2159   12/20/20 0600  cefoTEtan (CEFOTAN) 2 g in sodium chloride 0.9 % 100 mL IVPB        2 g 200 mL/hr over 30 Minutes Intravenous On call to O.R. 12/19/20 2057 12/20/20 1408   12/19/20 2000  cefTRIAXone (ROCEPHIN) 2 g in sodium chloride 0.9 % 100 mL IVPB  Status:  Discontinued        2 g 200 mL/hr over 30 Minutes Intravenous Every 24 hours 12/18/20 2219 12/20/20 1648   12/18/20 2030  cefTRIAXone (ROCEPHIN) 2 g in sodium chloride 0.9 % 100 mL IVPB        2 g 200 mL/hr over 30  Minutes Intravenous  Once 12/18/20 2026 12/18/20 2200      Assessment/Plan: Jeffrey Wheeler is a 85 yo s/p Lap chole for gangrenous cholecystitis doing fair but confused. Tolerating some diet but no BM. Having flatus.  PRN for pain, switched to tramadol given confusion worsening with roxicodone per RN Full liquid diet Miralax today, awaiting BM Zosyn 5 days post op for gangrenous cholecystitis  SCDs, lovenox   Wife requesting SNF/ Rehab but changing her mind per SW.    LOS: 4 days    Jeffrey Wheeler 12/22/2020

## 2020-12-22 NOTE — Progress Notes (Signed)
PROGRESS NOTE    Jeffrey Wheeler  CZY:[id number] DOB: [birth year] DOA: 12/18/2020 PCP: Jeffrey Noble, MD   Brief Narrative:   [name] a84 y.o.male,With history of scoliosis, prostate disease, hydrocephalus with VP shunt placed Dec 2021, GERD, chronic back pain, and more presents to the ED with a chief complaint of abdominal pain.Patient was admitted with acute cholecystitis and is undergoing laparoscopic cholecystectomy on 4/6 with noted gangrenous cholecystitis.  Currently remains on IV Zosyn  Assessment & Plan:   Principal Problem:   Acute cholecystitis Active Problems:   Gastroesophageal reflux disease   Hyponatremia   Gangrenous cholecystitis   Acute cholecystitis status post laparoscopic cholecystectomy 4/6 -Started on full liquids with further advancement per general surgery -Continue on IV Zosyn for now given gangrenous cholecystitis with plans for total 5 days of antibiotics -May transition to oral antibiotics once diet was further advanced -Continue to monitor labs -PT evaluation with recommendation for SNF on discharge -Still with no bowel movement, laxation ordered by general surgery  GERD -PPI  Hyponatremia secondary to decreased p.o. intake and dehydration-stable -Improved -Continue to monitor off IV fluid now that he is on clear liquids  Mild hypokalemia -Replete p.o. -Recheck in a.m. along with magnesium  History of BPH -Continue home Cardura   DVT prophylaxis:Heparin Code Status:Full Family Communication: Wife at bedside 4/7, tried calling 4/8 with no response Disposition Plan: Status is: Inpatient  Remains inpatient appropriate because:Ongoing diagnostic testing needed not appropriate for outpatient work up, IV treatments appropriate due to intensity of illness or inability to take PO and Inpatient level of care appropriate due to severity of illness   Dispo: The patient is from:Home Anticipated d/c is  UX:NATF Patient currently is not medically stable to d/c. Difficult to place patient No   Consultants:  General Surgery  Procedures:  Laparoscopic cholecystectomy 4/6  See below  Antimicrobials:  Anti-infectives (From admission, onward)   Start     Dose/Rate Route Frequency Ordered Stop   12/20/20 2200  piperacillin-tazobactam (ZOSYN) IVPB 3.375 g        3.375 g 12.5 mL/hr over 240 Minutes Intravenous Every 8 hours 12/20/20 1513 12/25/20 2159   12/20/20 0600  cefoTEtan (CEFOTAN) 2 g in sodium chloride 0.9 % 100 mL IVPB        2 g 200 mL/hr over 30 Minutes Intravenous On call to O.R. 12/19/20 2057 12/20/20 1408   12/19/20 2000  cefTRIAXone (ROCEPHIN) 2 g in sodium chloride 0.9 % 100 mL IVPB  Status:  Discontinued        2 g 200 mL/hr over 30 Minutes Intravenous Every 24 hours 12/18/20 2219 12/20/20 1648   12/18/20 2030  cefTRIAXone (ROCEPHIN) 2 g in sodium chloride 0.9 % 100 mL IVPB        2 g 200 mL/hr over 30 Minutes Intravenous  Once 12/18/20 2026 12/18/20 2200       Subjective: Patient seen and evaluated today with no new acute complaints or concerns. No acute concerns or events noted overnight.  He continues to pass flatus, and states that he is able to eat without any nausea or vomiting or abdominal pain.  He denies any bowel movements.  Objective: Vitals:   12/21/20 0441 12/21/20 1410 12/21/20 2133 12/22/20 0547  BP: (!) 153/93 131/86 (!) 143/85 (!) 143/79  Pulse: 81 70 67 60  Resp: 20  20 14   Temp: (!) 97.4 F (36.3 C) (!) 97.5 F (36.4 C) (!) 97.1 F (36.2 C) (!) 97.4 F (36.3 C)  TempSrc:  Oral  Oral  SpO2: 95% 95% 94% 94%  Weight:      Height:        Intake/Output Summary (Last 24 hours) at 12/22/2020 1101 Last data filed at 12/22/2020 0800 Gross per 24 hour  Intake 1609.95 ml  Output 550 ml  Net 1059.95 ml   Filed Weights   12/18/20 1559 12/18/20 2225  Weight: 99.8 kg 100.8 kg    Examination:  General exam:  Appears calm and comfortable, minimally confused at times Respiratory system: Clear to auscultation. Respiratory effort normal. Cardiovascular system: S1 & S2 heard, RRR.  Gastrointestinal system: Abdomen with incisions clean dry and intact Central nervous system: Alert and awake Extremities: No edema Skin: No significant lesions noted Psychiatry: Flat affect.    Data Reviewed: I have personally reviewed following labs and imaging studies  CBC: Recent Labs  Lab 12/18/20 1615 12/19/20 0524 12/20/20 0549 12/21/20 0657 12/22/20 0506  WBC 25.3* 19.8* 16.7* 14.9* 14.3*  NEUTROABS 19.2*  --  11.9* 11.2*  --   HGB 14.2 13.6 13.0 12.8* 12.1*  HCT 40.2 39.4 38.3* 36.8* 35.6*  MCV 91.8 92.7 93.4 92.5 93.0  PLT 230 208 237 296 706   Basic Metabolic Panel: Recent Labs  Lab 12/18/20 1615 12/19/20 0524 12/20/20 0549 12/21/20 0657 12/22/20 0506  NA 128* 130* 133* 134* 134*  K 4.0 3.8 3.5 3.8 3.4*  CL 97* 100 101 103 101  CO2 21* 21* 22 21* 25  GLUCOSE 109* 90 83 122* 108*  BUN 18 14 13 19 19   CREATININE 1.02 0.87 0.88 0.91 0.94  CALCIUM 9.1 8.6* 8.6* 8.7* 8.8*  MG  --  1.6*  --  1.9 1.8   GFR: Estimated Creatinine Clearance: 72.1 mL/min (by C-G formula based on SCr of 0.94 mg/dL). Liver Function Tests: Recent Labs  Lab 12/18/20 1615 12/19/20 0524 12/20/20 0549 12/21/20 0657  AST 30 21 17 17   ALT 24 19 17 18   ALKPHOS 95 91 97 96  BILITOT 1.4* 1.0 1.0 0.9  PROT 6.9 5.8* 5.7* 5.6*  ALBUMIN 3.6 3.0* 2.8* 2.5*   Recent Labs  Lab 12/18/20 1615  LIPASE 18   No results for input(s): AMMONIA in the last 168 hours. Coagulation Profile: No results for input(s): INR, PROTIME in the last 168 hours. Cardiac Enzymes: No results for input(s): CKTOTAL, CKMB, CKMBINDEX, TROPONINI in the last 168 hours. BNP (last 3 results) No results for input(s): PROBNP in the last 8760 hours. HbA1C: No results for input(s): HGBA1C in the last 72 hours. CBG: No results for input(s):  GLUCAP in the last 168 hours. Lipid Profile: No results for input(s): CHOL, HDL, LDLCALC, TRIG, CHOLHDL, LDLDIRECT in the last 72 hours. Thyroid Function Tests: No results for input(s): TSH, T4TOTAL, FREET4, T3FREE, THYROIDAB in the last 72 hours. Anemia Panel: No results for input(s): VITAMINB12, FOLATE, FERRITIN, TIBC, IRON, RETICCTPCT in the last 72 hours. Sepsis Labs: Recent Labs  Lab 12/18/20 2154  LATICACIDVEN 1.7    Recent Results (from the past 240 hour(s))  Resp Panel by RT-PCR (Flu A&B, Covid) Nasopharyngeal Swab     Status: None   Collection Time: 12/18/20  9:15 PM   Specimen: Nasopharyngeal Swab; Nasopharyngeal(NP) swabs in vial transport medium  Result Value Ref Range Status   SARS Coronavirus 2 by RT PCR NEGATIVE NEGATIVE Final    Comment: (NOTE) SARS-CoV-2 target nucleic acids are NOT DETECTED.  The SARS-CoV-2 RNA is generally detectable in upper respiratory specimens during the acute phase of infection. The  lowest concentration of SARS-CoV-2 viral copies this assay can detect is 138 copies/mL. A negative result does not preclude SARS-Cov-2 infection and should not be used as the sole basis for treatment or other patient management decisions. A negative result may occur with  improper specimen collection/handling, submission of specimen other than nasopharyngeal swab, presence of viral mutation(s) within the areas targeted by this assay, and inadequate number of viral copies(<138 copies/mL). A negative result must be combined with clinical observations, patient history, and epidemiological information. The expected result is Negative.  Fact Sheet for Patients:  EntrepreneurPulse.com.au  Fact Sheet for Healthcare Providers:  IncredibleEmployment.be  This test is no t yet approved or cleared by the [place] FDA and  has been authorized for detection and/or diagnosis of SARS-CoV-2 by FDA under an Emergency Use  Authorization (EUA). This EUA will remain  in effect (meaning this test can be used) for the duration of the COVID-19 declaration under Section 564(b)(1) of the Act, 21 U.S.C.section 360bbb-3(b)(1), unless the authorization is terminated  or revoked sooner.       Influenza A by PCR NEGATIVE NEGATIVE Final   Influenza B by PCR NEGATIVE NEGATIVE Final    Comment: (NOTE) The Xpert Xpress SARS-CoV-2/FLU/RSV plus assay is intended as an aid in the diagnosis of influenza from Nasopharyngeal swab specimens and should not be used as a sole basis for treatment. Nasal washings and aspirates are unacceptable for Xpert Xpress SARS-CoV-2/FLU/RSV testing.  Fact Sheet for Patients: EntrepreneurPulse.com.au  Fact Sheet for Healthcare Providers: IncredibleEmployment.be  This test is not yet approved or cleared by the [place] FDA and has been authorized for detection and/or diagnosis of SARS-CoV-2 by FDA under an Emergency Use Authorization (EUA). This EUA will remain in effect (meaning this test can be used) for the duration of the COVID-19 declaration under Section 564(b)(1) of the Act, 21 U.S.C. section 360bbb-3(b)(1), unless the authorization is terminated or revoked.  Performed at [facility], [address], [place],  [address]          Radiology Studies: No results found.      Scheduled Meds: . docusate sodium  100 mg Oral BID  . dorzolamide-timolol  1 drop Both Eyes BID  . doxazosin  8 mg Oral QHS  . gabapentin  100 mg Oral QHS  . heparin  5,000 Units Subcutaneous Q8H  . latanoprost  1 drop Both Eyes QHS  . pantoprazole (PROTONIX) IV  40 mg Intravenous Q24H  . vitamin B-12  1,000 mcg Oral QHS   Continuous Infusions: . piperacillin-tazobactam (ZOSYN)  IV 3.375 g (12/22/20 0629)     LOS: 4 days    Time spent: 35 minutes    Rosevelt Luu Darleen Crocker, DO [facility]  If 7PM-7AM, please contact  night-coverage [email or web address] 12/22/2020, 11:01 AM

## 2020-12-22 NOTE — TOC Initial Note (Signed)
Transition of Care [facility]) - Initial/Assessment Note    Patient Details  Name: Jeffrey Wheeler MRN: [id number] Date of Birth: 11/13/1935  [facility]) CM/SW Contact:    Boneta Lucks, RN Phone Number: 12/22/2020, 1:32 PM  Clinical Narrative:        Patient admitted with cholecystitis. Lives at home with his wife.  Yesterday PT recommending HHPT. TOC found Jeffrey Wheeler in [place] to accept patient. Please fax orders to [phone] at discharge.  Today PT and MD spoke with wife about SNF, she was agreeable, first choice [facility].  FL2 completed.  Wife called back to say, they have a hospital bed and he will not be happy at SNF and will want her to be there all day. She will get her son to assist, but wants him to discharge home with home health. TOC to follow for orders.            Expected Discharge Plan: [place] Barriers to Discharge: Continued Medical Work up   Patient Goals and CMS Choice Patient states their goals for this hospitalization and ongoing recovery are:: to go home. CMS Medicare.gov Compare Post Acute Care list provided to:: Patient Represenative (must comment) Choice offered to / list presented to : Spouse  Expected Discharge Plan and Services Expected Discharge Plan: [place] Choice: Durable Medical Equipment Living arrangements for the past 2 months: Single Family Home         HH Agency: [facility] Date HH Agency Contacted: 12/22/20 Time HH Agency Contacted: 0830    Prior Living Arrangements/Services Living arrangements for the past 2 months: Single Family Home Lives with:: Spouse          Need for Family Participation in Patient Care: Yes (Comment) Care giver support system in place?: Yes (comment)   Criminal Activity/Legal Involvement Pertinent to Current Situation/Hospitalization: No - Comment as needed  Activities of Daily Living Home Assistive Devices/Equipment: Hearing  aid,Eyeglasses ADL Screening (condition at time of admission) Patient's cognitive ability adequate to safely complete daily activities?: Yes Is the patient deaf or have difficulty hearing?: Yes Does the patient have difficulty seeing, even when wearing glasses/contacts?: No Does the patient have difficulty concentrating, remembering, or making decisions?: No Patient able to express need for assistance with ADLs?: Yes Does the patient have difficulty dressing or bathing?: No Independently performs ADLs?: Yes (appropriate for developmental age) Does the patient have difficulty walking or climbing stairs?: No Weakness of Legs: None Weakness of Arms/Hands: None  Permission Sought/Granted   Emotional Assessment    Orientation: : Oriented to Self,Oriented to Place,Oriented to  Time Alcohol / Substance Use: Not Applicable Psych Involvement: No (comment)  Admission diagnosis:  Acute cholecystitis [K81.0] Gallbladder disease [K82.9] Patient Active Problem List   Diagnosis Date Noted  . Gangrenous cholecystitis   . Acute cholecystitis 12/18/2020  . Hyponatremia 12/18/2020  . Normal pressure hydrocephalus ([place]) 08/04/2020  . Polyneuropathy 08/04/2020  . Gait disturbance 08/04/2020  . Urge incontinence 08/04/2020  . Abdominal pain, left lower quadrant 09/13/2014  . Abdominal fullness in left flank 09/13/2014  . 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 DJD (degenerative joint disease) of knee 09/12/2014  . Primary localized osteoarthritis of right knee   . Glaucoma   . Prostate disease   . Gastroesophageal reflux disease   . Back pain    PCP:  Asencion Noble, MD Pharmacy:   [place], [place] - [id number] [place] [address] [place] [address] Phone: [phone] Fax: [phone]  [place], [place] [address] Phone: [phone] Fax: [phone]   Readmission Risk  Interventions Readmission Risk Prevention Plan 12/22/2020  Medication Screening Complete  Transportation Screening Complete  Some recent data might be hidden

## 2020-12-22 NOTE — NC FL2 (Signed)
[place] LEVEL OF CARE SCREENING TOOL     IDENTIFICATION  Patient Name: Jeffrey Wheeler Birthdate: 10-08-1935 Sex: male Admission Date (Current Location): 12/18/2020  [facility] and [place] Number:  [employer] and Address:  [place] [address], [place]      Provider Number: [id number]  Attending Physician Name and Address:  Rodena Goldmann, DO  Relative Name and Phone Number:  Alvon Nygaard - wife   [phone]    Current Level of Care: Hospital Recommended Level of Care: [place] Prior Approval Number:    Date Approved/Denied:   PASRR Number:    Discharge Plan: SNF    Current Diagnoses: Patient Active Problem List   Diagnosis Date Noted  . Gangrenous cholecystitis   . Acute cholecystitis 12/18/2020  . Hyponatremia 12/18/2020  . Normal pressure hydrocephalus ([place]) 08/04/2020  . Polyneuropathy 08/04/2020  . Gait disturbance 08/04/2020  . Urge incontinence 08/04/2020  . Abdominal pain, left lower quadrant 09/13/2014  . Abdominal fullness in left flank 09/13/2014  . 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 DJD (degenerative joint disease) of knee 09/12/2014  . Primary localized osteoarthritis of right knee   . Glaucoma   . Prostate disease   . Gastroesophageal reflux disease   . Back pain     Orientation RESPIRATION BLADDER Height & Weight     Self,Time,Situation,Place  Normal Continent Weight: 100.8 kg Height:  5\' 10"  (177.8 cm)  BEHAVIORAL SYMPTOMS/MOOD NEUROLOGICAL BOWEL NUTRITION STATUS      Continent Diet (See DC Summary)  AMBULATORY STATUS COMMUNICATION OF NEEDS Skin   Extensive Assist Verbally Surgical wounds (ABDOMEN)                       Personal Care Assistance Level of Assistance  Bathing,Feeding,Dressing Bathing Assistance: Maximum assistance Feeding assistance: Limited assistance Dressing Assistance: Maximum assistance     Functional Limitations Info  Sight,Hearing,Speech Sight  Info: Adequate Hearing Info: Impaired Speech Info: Adequate    SPECIAL CARE FACTORS FREQUENCY  PT (By licensed PT)                    Contractures Contractures Info: Present    Additional Factors Info  Code Status,Allergies Code Status Info: Full Allergies Info: NKDA           Current Medications (12/22/2020):  This is the current hospital active medication list Current Facility-Administered Medications  Medication Dose Route Frequency Provider Last Rate Last Admin  . acetaminophen (TYLENOL) tablet 650 mg  650 mg Oral Q6H PRN Virl Cagey, MD   650 mg at 12/21/20 0604   Or  . acetaminophen (TYLENOL) suppository 650 mg  650 mg Rectal Q6H PRN Virl Cagey, MD      . docusate sodium (COLACE) capsule 100 mg  100 mg Oral BID Virl Cagey, MD   100 mg at 12/22/20 0737  . dorzolamide-timolol (COSOPT) 22.3-6.8 MG/ML ophthalmic solution 1 drop  1 drop Both Eyes BID Virl Cagey, MD   1 drop at 12/22/20 0737  . doxazosin (CARDURA) tablet 8 mg  8 mg Oral QHS Virl Cagey, MD   8 mg at 12/21/20 2150  . gabapentin (NEURONTIN) capsule 100 mg  100 mg Oral QHS Virl Cagey, MD   100 mg at 12/21/20 2151  . heparin injection 5,000 Units  5,000 Units Subcutaneous Q8H Virl Cagey, MD   5,000 Units at 12/22/20 0522  . latanoprost (XALATAN)  0.005 % ophthalmic solution 1 drop  1 drop Both Eyes QHS Virl Cagey, MD   1 drop at 12/21/20 2152  . morphine 2 MG/ML injection 2 mg  2 mg Intravenous Q2H PRN Virl Cagey, MD   2 mg at 12/20/20 1732  . ondansetron (ZOFRAN) tablet 4 mg  4 mg Oral Q6H PRN Virl Cagey, MD       Or  . ondansetron [facility]) injection 4 mg  4 mg Intravenous Q6H PRN Virl Cagey, MD      . oxyCODONE (Oxy IR/ROXICODONE) immediate release tablet 5-10 mg  5-10 mg Oral Q4H PRN Virl Cagey, MD   10 mg at 12/21/20 1220  . pantoprazole (PROTONIX) injection 40 mg  40 mg Intravenous Q24H Virl Cagey, MD   40  mg at 12/21/20 2150  . piperacillin-tazobactam (ZOSYN) IVPB 3.375 g  3.375 g Intravenous Q8H Coffee, Donna Christen, RPH 12.5 mL/hr at 12/22/20 0629 3.375 g at 12/22/20 0629  . polyethylene glycol (MIRALAX / GLYCOLAX) packet 17 g  17 g Oral Daily Virl Cagey, MD      . vitamin B-12 (CYANOCOBALAMIN) tablet 1,000 mcg  1,000 mcg Oral QHS Virl Cagey, MD   1,000 mcg at 12/21/20 2151     Discharge Medications: Please see discharge summary for a list of discharge medications.  Relevant Imaging Results:  Relevant Lab Results:   Additional Information SS# [id number]  Boneta Lucks, RN

## 2020-12-22 NOTE — Care Management Important Message (Signed)
Important Message  Patient Details  Name: Jeffrey Wheeler MRN: [id number] Date of Birth: 10-16-1935   Medicare Important Message Given:  Yes     Tommy Medal 12/22/2020, 3:32 PM

## 2020-12-22 NOTE — Progress Notes (Signed)
Physical Therapy Treatment Patient Details Name: Jeffrey Wheeler MRN: [id number] DOB: 01-04-1936 Today's Date: 12/22/2020    History of Present Illness Jeffrey Wheeler is a 85 y.o. male with prostate disease, hydrocephalus s/p VP shunt with Dr. Zada Finders 08/2020, chronic back pain, who presented with 5 days of abdominal pain and associated nausea and vomiting. This all peaked on Saturday and he ha a CT a/p done at [facility] and this was reported to be negative and he was sent home.  He says the pain is severe in the RUQ and constant in nature now.He was sent to the ED by Dr. Willey Blade.  He says he has a history of chronic constipation. He has never had any cardiac issues.    PT Comments    Patient demonstrates slow labored movement for sitting up at bedside, unable to stand without AD requiring use of RW to complete sit to stands, unsteady on feet with tendency to lean over RW for support resulting in RW to far in front, no loss of balance during ambulation, but limited due to fatigue.  Patient tolerated sitting up in chair after therapy - RN aware.  Patient will benefit from continued physical therapy in hospital and recommended venue below to increase strength, balance, endurance for safe ADLs and gait.     Follow Up Recommendations  SNF;Supervision for mobility/OOB;Supervision - Intermittent     Equipment Recommendations  None recommended by PT    Recommendations for Other Services       Precautions / Restrictions Precautions Precautions: Fall Restrictions Weight Bearing Restrictions: No    Mobility  Bed Mobility Overal bed mobility: Needs Assistance Bed Mobility: Supine to Sit     Supine to sit: Min assist     General bed mobility comments: increased time, slow labored movement    Transfers Overall transfer level: Needs assistance Equipment used: Rolling walker (2 wheeled) Transfers: Sit to/from [facility] Sit to Stand: Min assist;Mod assist Stand  pivot transfers: Min assist       General transfer comment: very unsteady with near loss of balance when attempting sit to stands without AD, required use of RW for safety  Ambulation/Gait Ambulation/Gait assistance: Min guard Gait Distance (Feet): 55 Feet Assistive device: Rolling walker (2 wheeled) Gait Pattern/deviations: Decreased step length - right;Decreased step length - left;Decreased stride length Gait velocity: decreased   General Gait Details: slow labored cadence with tendency to push RW far in front, no loss of balance, limited secondary to fatigue   Stairs             Wheelchair Mobility    Modified Rankin (Stroke Patients Only)       Balance Overall balance assessment: Needs assistance Sitting-balance support: Feet supported;No upper extremity supported Sitting balance-Leahy Scale: Good Sitting balance - Comments: seated at EOB   Standing balance support: During functional activity;No upper extremity supported Standing balance-Leahy Scale: Poor Standing balance comment: fair using RW                            Cognition Arousal/Alertness: Awake/alert Behavior During Therapy: WFL for tasks assessed/performed Overall Cognitive Status: Within Functional Limits for tasks assessed                                        Exercises General Exercises - Lower Extremity Long Arc Quad: Seated;AROM;Strengthening;Both;10 reps Hip Flexion/Marching:  Seated;AROM;Strengthening;10 reps Toe Raises: Seated;AROM;Strengthening;Both;15 reps Heel Raises: Seated;AROM;Strengthening;Both;15 reps    General Comments        Pertinent Vitals/Pain Pain Assessment: Faces Faces Pain Scale: Hurts a [place]guirk bit Pain Location: neck and back Pain Descriptors / Indicators: Aching Pain Intervention(s): Limited activity within patient's tolerance;Monitored during session    Home Living                      Prior Function            PT  Goals (current goals can now be found in the care plan section) Acute Rehab PT Goals Patient Stated Goal: Go home PT Goal Formulation: With patient Time For Goal Achievement: 01/04/21 Potential to Achieve Goals: Good Progress towards PT goals: Progressing toward goals    Frequency    Min 3X/week      PT Plan Current plan remains appropriate    Co-evaluation              AM-PAC PT "6 Clicks" Mobility   Outcome Measure  Help needed turning from your back to your side while in a flat bed without using bedrails?: A [place] Help needed moving from lying on your back to sitting on the side of a flat bed without using bedrails?: A Lot Help needed moving to and from a bed to a chair (including a wheelchair)?: A [place] Help needed standing up from a chair using your arms (e.g., wheelchair or bedside chair)?: A [place] Help needed to walk in hospital room?: A [place] Help needed climbing 3-5 steps with a railing? : A Lot 6 Click Score: 16    End of Session   Activity Tolerance: Patient tolerated treatment well;Patient limited by fatigue Patient left: in chair;with call bell/phone within reach Nurse Communication: Mobility status PT Visit Diagnosis: Unsteadiness on feet (R26.81);Muscle weakness (generalized) (M62.81);Other abnormalities of gait and mobility (R26.89)     Time: 2703-5009 PT Time Calculation (min) (ACUTE ONLY): 31 min  Charges:  $Gait Training: 8-22 mins $Therapeutic Exercise: 8-22 mins                     10:09 AM, 12/22/20 Lonell Grandchild, MPT Physical Therapist with [facility] [phone] office [phone] mobile phone

## 2020-12-23 DIAGNOSIS — K810 Acute cholecystitis: Secondary | ICD-10-CM | POA: Diagnosis not present

## 2020-12-23 LAB — CBC
HCT: 39.9 % (ref 39.0–52.0)
Hemoglobin: 13.4 g/dL (ref 13.0–17.0)
MCH: 31.7 pg (ref 26.0–34.0)
MCHC: 33.6 g/dL (ref 30.0–36.0)
MCV: 94.3 fL (ref 80.0–100.0)
Platelets: 371 10*3/uL (ref 150–400)
RBC: 4.23 MIL/uL (ref 4.22–5.81)
RDW: 14.3 % (ref 11.5–15.5)
WBC: 10.6 10*3/uL — ABNORMAL HIGH (ref 4.0–10.5)
nRBC: 0 % (ref 0.0–0.2)

## 2020-12-23 LAB — BASIC METABOLIC PANEL
Anion gap: 8 (ref 5–15)
BUN: 13 mg/dL (ref 8–23)
CO2: 26 mmol/L (ref 22–32)
Calcium: 8.9 mg/dL (ref 8.9–10.3)
Chloride: 104 mmol/L (ref 98–111)
Creatinine, Ser: 0.96 mg/dL (ref 0.61–1.24)
GFR, Estimated: >60 mL/min (ref >60)
Glucose, Bld: 101 mg/dL — ABNORMAL HIGH (ref 70–99)
Potassium: 3.8 mmol/L (ref 3.5–5.1)
Sodium: 138 mmol/L (ref 135–145)

## 2020-12-23 LAB — MAGNESIUM: Magnesium: 1.9 mg/dL (ref 1.7–2.4)

## 2020-12-23 MED ORDER — HALOPERIDOL LACTATE 5 MG/ML IJ SOLN: 2.0000 mg | Freq: Four times a day (QID) | INTRAMUSCULAR | Status: DC | PRN

## 2020-12-23 MED ORDER — QUETIAPINE FUMARATE 25 MG PO TABS
25.0000 mg | ORAL_TABLET | Freq: Every day | ORAL | Status: DC
  Administered 2020-12-23: 25 mg via ORAL
  Filled 2020-12-23: qty 1

## 2020-12-23 NOTE — Progress Notes (Signed)
[facility]  Abd pain improving, leukocytosis resolving. Will continue on zosyn for until tomorrow. Delirious but redirectable. Had BM yesterday.   Soft diet. Miralax for BM  Curlene Labrum, MD [facility] [address] [place], [place] [address] [phone] (office)

## 2020-12-23 NOTE — Progress Notes (Signed)
Pharmacy Antibiotic Note  Jeffrey Wheeler is a 85 y.o. male admitted on 12/18/2020 with gangrenous colitis .  Pharmacy has been consulted for zosyn dosing. POD #3, Plan is for 5 days zosyn post op for gangenous cholecystitis. AF, WBC normalized  Plan: Continue Zosyn 3.375g IV q8h (4 hour infusion). x 5 days per Dr Constance Haw Monitor V/S, labs  Height: 5\' 10"  (177.8 cm) Weight: 100.8 kg (222 lb 3.2 oz) IBW/kg (Calculated) : 73  Temp (24hrs), Avg:98.1 F (36.7 C), Min:97.8 F (36.6 C), Max:98.4 F (36.9 C)  Recent Labs  Lab 12/18/20 2154 12/19/20 0524 12/20/20 0549 12/21/20 0657 12/22/20 0506 12/23/20 0555  WBC  --  19.8* 16.7* 14.9* 14.3* 10.6*  CREATININE  --  0.87 0.88 0.91 0.94 0.96  LATICACIDVEN 1.7  --   --   --   --   --     Estimated Creatinine Clearance: 70.6 mL/min (by C-G formula based on SCr of 0.96 mg/dL).    No Known Allergies  Antimicrobials this admission: 4/6 zosyn >>  4/4 ceftriaxone  >> 4/6  Microbiology results: 4/4 Resp panel: negative   Thank you for allowing pharmacy to be a part of this patient's care.  Isac Sarna, BS Pharm D, [place] Clinical Pharmacist Pager [phone] 12/23/2020 9:40 AM

## 2020-12-23 NOTE — Treatment Plan (Signed)
Pt very agitated at this time, Pt is yelling at staff, stating to get out of his living room and to leave him alone , pt states he is going home. Pt jumped out of bed stating he needs to go to the bathroom. Pt redressed as he took off all his clothes and ambulated to bathroom. Pt attempting to look for a suitcase at this time. Pt not able to have a BM at this time, voided. Pt then ambulated back to chair, chair alarm on. Pt ripped off condom cath and incision dressings at this time. Replaced dressings and condom cath. Pt reoriented at this time to place, date and time. Pt states he has not idea where he is. Will continue to monitor.

## 2020-12-23 NOTE — Progress Notes (Signed)
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 Currently remains on IV Zosyn day 4/5.  Patient now having issues with acute delirium.  Assessment & Plan:   Principal Problem:   Acute cholecystitis Active Problems:   Gastroesophageal reflux disease   Hyponatremia   Gangrenous cholecystitis   Acute cholecystitisstatus post laparoscopic cholecystectomy 4/6 -Started on full liquids with further advancement per general surgery -Continue on IV Zosyn for now given gangrenous cholecystitis with plans for total 5 days of antibiotics, currently day 4/5 -Continue to monitor labs -PT evaluation with recommendation for SNF on discharge, but family prefers H/H -Still with no bowel movement, laxation ordered by general surgery  Acute delirium -Noted to have significant agitation overnight on 4/8 -This is a common occurrence for him after each surgery, according to the wife -Keep close to nursing station -Wife to be at bedside which is helpful -Haldol ordered as needed  GERD -PPI  Hyponatremia secondary to decreased p.o. intake and dehydration-stable -Improved -Continue to monitor intermittently  History of BPH -Continue home Cardura   DVT prophylaxis:Heparin Code Status:Full Family Communication:Discussed with wife on phone 4/9 Disposition Plan: Status is: Inpatient  Remains inpatient appropriate because:Ongoing diagnostic testing needed not appropriate for outpatient work up, IV treatments appropriate due to intensity of illness or inability to  take PO and Inpatient level of care appropriate due to severity of illness   Dispo: The patient is from:Home Anticipated d/c is UV:[facility] Patient currently is not medically stable to d/c. Difficult to place patient No   Consultants:  General Surgery  Procedures:  Laparoscopic cholecystectomy 4/6  See below  Antimicrobials:  Anti-infectives (From admission, onward)   Start     Dose/Rate Route Frequency Ordered Stop   12/20/20 2200  piperacillin-tazobactam (ZOSYN) IVPB 3.375 g        3.375 g 12.5 mL/hr over 240 Minutes Intravenous Every 8 hours 12/20/20 1513 12/25/20 2159   12/20/20 0600  cefoTEtan (CEFOTAN) 2 g in sodium chloride 0.9 % 100 mL IVPB        2 g 200 mL/hr over 30 Minutes Intravenous On call to O.R. 12/19/20 2057 12/20/20 1408   12/19/20 2000  cefTRIAXone (ROCEPHIN) 2 g in sodium chloride 0.9 % 100 mL IVPB  Status:  Discontinued        2 g 200 mL/hr over 30 Minutes Intravenous Every 24 hours 12/18/20 2219 12/20/20 1648   12/18/20 2030  cefTRIAXone (ROCEPHIN) 2 g in sodium chloride 0.9 % 100 mL IVPB        2 g 200 mL/hr over 30 Minutes Intravenous  Once 12/18/20 2026 12/18/20 2200       Subjective: Patient seen and evaluated today and appears confused.  He was noted to be quite agitated and combative overnight.  He had tried to have a bowel movement this morning, but failed.  Objective: Vitals:   12/22/20 0547 12/22/20 1302 12/22/20 2133 12/23/20 0706  BP: (!) 143/79 100/61 (!) 148/80 138/78  Pulse: 60 69 73 65  Resp: 14 16 18 20   Temp: (!) 97.4 F (36.3 C)  98.4 F (36.9 C) 97.8 F (36.6 C)  TempSrc: Oral  Oral   SpO2: 94% 97% 97% 95%  Weight:      Height:        Intake/Output Summary (Last 24 hours) at 12/23/2020 1038 Last data filed at 12/23/2020 0745 Gross per 24 hour  Intake 795.3 ml  Output 1650 ml  Net -854.7 ml   Filed Weights   12/18/20 1559 12/18/20 2225  Weight: 99.8 kg 100.8 kg     Examination:  General exam: Appears confused, but currently calm Respiratory system: Clear to auscultation. Respiratory effort normal. Cardiovascular system: S1 & S2 heard, RRR.  Gastrointestinal system: Abdomen is soft, incisions clean dry and intact Central nervous system: Alert and awake Extremities: No edema Skin: No significant lesions noted Psychiatry: Flat affect.    Data Reviewed: I have personally reviewed following labs and imaging studies  CBC: Recent Labs  Lab 12/18/20 1615 12/19/20 0524 12/20/20 0549 12/21/20 0657 12/22/20 0506 12/23/20 0555  WBC 25.3* 19.8* 16.7* 14.9* 14.3* 10.6*  NEUTROABS 19.2*  --  11.9* 11.2*  --   --   HGB 14.2 13.6 13.0 12.8* 12.1* 13.4  HCT 40.2 39.4 38.3* 36.8* 35.6* 39.9  MCV 91.8 92.7 93.4 92.5 93.0 94.3  PLT 230 208 237 296 330 161   Basic Metabolic Panel: Recent Labs  Lab 12/19/20 0524 12/20/20 0549 12/21/20 0657 12/22/20 0506 12/23/20 0555  NA 130* 133* 134* 134* 138  K 3.8 3.5 3.8 3.4* 3.8  CL 100 101 103 101 104  CO2 21* 22 21* 25 26  GLUCOSE 90 83 122* 108* 101*  BUN 14 13 19 19 13   CREATININE 0.87 0.88 0.91 0.94 0.96  CALCIUM 8.6* 8.6* 8.7* 8.8* 8.9  MG 1.6*  --  1.9 1.8 1.9   GFR: Estimated Creatinine Clearance: 70.6 mL/min (by C-G formula based on SCr of 0.96 mg/dL). Liver Function Tests: Recent Labs  Lab 12/18/20 1615 12/19/20 0524 12/20/20 0549 12/21/20 0657  AST 30 21 17 17   ALT 24 19 17 18   ALKPHOS 95 91 97 96  BILITOT 1.4* 1.0 1.0 0.9  PROT 6.9 5.8* 5.7* 5.6*  ALBUMIN 3.6 3.0* 2.8* 2.5*   Recent Labs  Lab 12/18/20 1615  LIPASE 18   No results for input(s): AMMONIA in the last 168 hours. Coagulation Profile: No results for input(s): INR, PROTIME in the last 168 hours. Cardiac Enzymes: No results for input(s): CKTOTAL, CKMB, CKMBINDEX, TROPONINI in the last 168 hours. BNP (last 3 results) No results for input(s): PROBNP in the last 8760 hours. HbA1C: No results for input(s):  HGBA1C in the last 72 hours. CBG: No results for input(s): GLUCAP in the last 168 hours. Lipid Profile: No results for input(s): CHOL, HDL, LDLCALC, TRIG, CHOLHDL, LDLDIRECT in the last 72 hours. Thyroid Function Tests: No results for input(s): TSH, T4TOTAL, FREET4, T3FREE, THYROIDAB in the last 72 hours. Anemia Panel: No results for input(s): VITAMINB12, FOLATE, FERRITIN, TIBC, IRON, RETICCTPCT in the last 72 hours. Sepsis Labs: Recent Labs  Lab 12/18/20 2154  LATICACIDVEN 1.7    Recent Results (from the past 240 hour(s))  Resp Panel by RT-PCR (Flu A&B, Covid) Nasopharyngeal Swab     Status: None   Collection Time: 12/18/20  9:15 PM   Specimen: Nasopharyngeal Swab; Nasopharyngeal(NP) swabs in vial transport medium  Result Value Ref Range Status   SARS Coronavirus 2 by RT PCR NEGATIVE NEGATIVE Final    Comment: (NOTE) SARS-CoV-2 target nucleic acids are NOT DETECTED.  The SARS-CoV-2 RNA is generally detectable in upper respiratory specimens during the  acute phase of infection. The lowest concentration of SARS-CoV-2 viral copies this assay can detect is 138 copies/mL. A negative result does not preclude SARS-Cov-2 infection and should not be used as the sole basis for treatment or other patient management decisions. A negative result may occur with  improper specimen collection/handling, submission of specimen other than nasopharyngeal swab, presence of viral mutation(s) within the areas targeted by this assay, and inadequate number of viral copies(<138 copies/mL). A negative result must be combined with clinical observations, patient history, and epidemiological information. The expected result is Negative.  Fact Sheet for Patients:  EntrepreneurPulse.com.au  Fact Sheet for Healthcare Providers:  IncredibleEmployment.be  This test is no t yet approved or cleared by the [place] FDA and  has been authorized for detection and/or  diagnosis of SARS-CoV-2 by FDA under an Emergency Use Authorization (EUA). This EUA will remain  in effect (meaning this test can be used) for the duration of the COVID-19 declaration under Section 564(b)(1) of the Act, 21 U.S.C.section 360bbb-3(b)(1), unless the authorization is terminated  or revoked sooner.       Influenza A by PCR NEGATIVE NEGATIVE Final   Influenza B by PCR NEGATIVE NEGATIVE Final    Comment: (NOTE) The Xpert Xpress SARS-CoV-2/FLU/RSV plus assay is intended as an aid in the diagnosis of influenza from Nasopharyngeal swab specimens and should not be used as a sole basis for treatment. Nasal washings and aspirates are unacceptable for Xpert Xpress SARS-CoV-2/FLU/RSV testing.  Fact Sheet for Patients: EntrepreneurPulse.com.au  Fact Sheet for Healthcare Providers: IncredibleEmployment.be  This test is not yet approved or cleared by the [place] FDA and has been authorized for detection and/or diagnosis of SARS-CoV-2 by FDA under an Emergency Use Authorization (EUA). This EUA will remain in effect (meaning this test can be used) for the duration of the COVID-19 declaration under Section 564(b)(1) of the Act, 21 U.S.C. section 360bbb-3(b)(1), unless the authorization is terminated or revoked.  Performed at [facility], [address] [place], [place] [address]          Radiology Studies: No results found.      Scheduled Meds: . docusate sodium  100 mg Oral BID  . dorzolamide-timolol  1 drop Both Eyes BID  . doxazosin  8 mg Oral QHS  . gabapentin  100 mg Oral QHS  . heparin  5,000 Units Subcutaneous Q8H  . latanoprost  1 drop Both Eyes QHS  . pantoprazole (PROTONIX) IV  40 mg Intravenous Q24H  . polyethylene glycol  17 g Oral Daily  . vitamin B-12  1,000 mcg Oral QHS   Continuous Infusions: . piperacillin-tazobactam (ZOSYN)  IV 3.375 g (12/23/20 0552)     LOS: 5 days    Time spent: 35  minutes    Kizzy Olafson D Manuella Ghazi, DO [facility]  If 7PM-7AM, please contact night-coverage [email or web address] 12/23/2020, 10:38 AM

## 2020-12-24 DIAGNOSIS — K810 Acute cholecystitis: Secondary | ICD-10-CM | POA: Diagnosis not present

## 2020-12-24 LAB — CBC
HCT: 37.2 % — ABNORMAL LOW (ref 39.0–52.0)
Hemoglobin: 12.5 g/dL — ABNORMAL LOW (ref 13.0–17.0)
MCH: 31.7 pg (ref 26.0–34.0)
MCHC: 33.6 g/dL (ref 30.0–36.0)
MCV: 94.4 fL (ref 80.0–100.0)
Platelets: 374 10*3/uL (ref 150–400)
RBC: 3.94 MIL/uL — ABNORMAL LOW (ref 4.22–5.81)
RDW: 14.1 % (ref 11.5–15.5)
WBC: 10.2 10*3/uL (ref 4.0–10.5)
nRBC: 0 % (ref 0.0–0.2)

## 2020-12-24 MED ORDER — TRAMADOL HCL 50 MG PO TABS: 50.0000 mg | ORAL_TABLET | Freq: Four times a day (QID) | ORAL | 0 refills | Status: AC | PRN

## 2020-12-24 NOTE — Progress Notes (Signed)
[facility]  Doing well and eating. Less delirious. Will see in the clinic 4/21 to remove staples. F/u with Dr. Zada Finders in next few weeks.  Curlene Labrum, MD [facility] [address] [place], [place] [address] [phone] (office)

## 2020-12-24 NOTE — Discharge Instructions (Signed)
Discharge Laparoscopic Surgery Instructions:  Common Complaints: Right shoulder pain is common after laparoscopic surgery. This is secondary to the gas used in the surgery being trapped under the diaphragm.  Walk to help your body absorb the gas. This will improve in a few days. Pain at the port sites are common, especially the larger port sites. This will improve with time.  Some nausea is common and poor appetite. The main goal is to stay hydrated the first few days after surgery.   Diet/ Activity: Diet as tolerated. You may not have an appetite, but it is important to stay hydrated. Drink 64 ounces of water a day. Your appetite will return with time.  Shower per your regular routine daily.  Do not take hot showers. Take warm showers that are less than 10 minutes. Rest and listen to your body, but do not remain in bed all day.  Walk everyday for at least 15-20 minutes. Deep cough and move around every 1-2 hours in the first few days after surgery.  Do not lift > 10 lbs, perform excessive bending, pushing, pulling, squatting for 1-2 weeks after surgery.  Do not pick at the staples. We will remove these at your visit.  Do not place lotions or balms on your incision unless instructed to specifically by Dr. Constance Haw.   Pain Expectations and Narcotics: -After surgery you will have pain associated with your incisions and this is normal. The pain is muscular and nerve pain, and will get better with time. -You are encouraged and expected to take non narcotic medications like tylenol and ibuprofen (when able) to treat pain as multiple modalities can aid with pain treatment. -Narcotics are only used when pain is severe or there is breakthrough pain. -You are not expected to have a pain score of 0 after surgery, as we cannot prevent pain. A pain score of 3-4 that allows you to be functional, move, walk, and tolerate some activity is the goal. The pain will continue to improve over the days after surgery  and is dependent on your surgery. -Due to [place] law, we are only able to give a certain amount of pain medication to treat post operative pain, and we only give additional narcotics on a patient by patient basis.  -For most laparoscopic surgery, studies have shown that the majority of patients only need 10-15 narcotic pills, and for open surgeries most patients only need 15-20.   -Having appropriate expectations of pain and knowledge of pain management with non narcotics is important as we do not want anyone to become addicted to narcotic pain medication.  -Using ice packs in the first 48 hours and heating pads after 48 hours, wearing an abdominal binder (when recommended), and using over the counter medications are all ways to help with pain management.   -Simple acts like meditation and mindfulness practices after surgery can also help with pain control and research has proven the benefit of these practices.  Medication: Take tylenol and ibuprofen as needed for pain control, alternating every 4-6 hours.  Example:  Tylenol 1000mg  @ 6am, 12noon, 6pm, 19midnight (Do not exceed 4000mg  of tylenol a day). Ibuprofen 800mg  @ 9am, 3pm, 9pm, 3am (Do not exceed 3600mg  of ibuprofen a day).  Take narcotic for breakthrough pain every 4 hours.  Take Colace for constipation related to narcotic pain medication. If you do not have a bowel movement in 2 days, take Miralax over the counter.  Drink plenty of water to also prevent constipation.   Contact  Information: If you have questions or concerns, please call our office, [phone], Monday- Thursday 8AM-5PM and Friday 8AM-12Noon.  If it is after hours or on the weekend, please call [facility] Main Number, [phone], [phone], and ask to speak to the surgeon on call for Dr. Constance Haw at [facility].

## 2020-12-24 NOTE — Progress Notes (Signed)
Pt has discharge orders. Discharge teaching given to pt and wife who is bedside. No further questions at this time. Pt wheeled down to main lobby to vehicle accompanied by wife.

## 2020-12-24 NOTE — Discharge Summary (Signed)
Physician Discharge Summary  Jeffrey Wheeler ZOX:[id number] DOB: 1936-08-17 DOA: 12/18/2020  PCP: Jeffrey Noble, MD  Admit date: 12/18/2020  Discharge date: 12/24/2020  Admitted From:Home  Disposition:  Home  Recommendations for Outpatient Follow-up:  1. Follow up with PCP in 1-2 weeks 2. Continue on pain medications with tramadol as needed for breakthrough pain 10 tablets prescribed with 0 refills 3. Follow-up with Dr. Venetia Constable as recommended in 3-4 weeks 4. Follow-up with Dr. 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  He has remained on IV Zosyn for total 5 days to complete course of antibiotic treatment.  He did have some acute delirium postoperatively which has now resolved.  He has been seen by general surgery and is stable for discharge home today with home health physical therapy as he declines SNF/rehab.  No other acute events noted throughout the course of the stay.  Will need follow-up with general surgery as noted above as well as his neurosurgeon Dr. Venetia Constable to evaluate his VP shunt.  Discharge Diagnoses:  Principal Problem:   Acute cholecystitis Active Problems:   Gastroesophageal reflux disease   Hyponatremia   Gangrenous cholecystitis  Principal discharge diagnosis: Acute gangrenous cholecystitis status post laparoscopic cholecystectomy.  Discharge Instructions  Discharge Instructions    Diet - low sodium heart healthy   Complete by: As directed     If the dressing is still on your incision site when you go home, remove it on the third day after your surgery date. Remove dressing if it begins to fall off, or if it is dirty or damaged before the third day.   Complete by: As directed    Increase activity slowly   Complete by: As directed      Allergies as of 12/24/2020   No Known Allergies     Medication List    TAKE these medications   aspirin EC 81 MG tablet Take 1 tablet (81 mg total) by mouth at bedtime. Restart on 08/27/20   docusate sodium 100 MG capsule Commonly known as: COLACE Take 300 mg by mouth at bedtime.   dorzolamide-timolol 22.3-6.8 MG/ML ophthalmic solution Commonly known as: COSOPT Place 1 drop into both eyes 2 (two) times daily.   doxazosin 8 MG tablet Commonly known as: CARDURA Take 8 mg by mouth at bedtime.   gabapentin 300 MG capsule Commonly known as: NEURONTIN Take 100 mg by mouth at bedtime.   HYDROcodone-acetaminophen 5-325 MG tablet Commonly known as: NORCO/VICODIN Take 1 tablet by mouth every 4 (four) hours as needed for moderate pain.   latanoprost 0.005 % ophthalmic solution Commonly known as: XALATAN Place 1 drop into both eyes at bedtime.   mirabegron ER 25 MG Tb24 tablet Commonly known as: MYRBETRIQ Take 25 mg by mouth at bedtime.   pantoprazole 40 MG tablet Commonly known as: PROTONIX Take 40 mg by mouth 2 (two) times daily before a meal.   traMADol 50 MG tablet Commonly known as: ULTRAM Take 1-2 tablets (50-100 mg total) by mouth every 6 (six) hours as needed for moderate pain or severe pain.   valACYclovir 1000 MG  tablet Commonly known as: VALTREX Take 1,000 mg by mouth at bedtime.   vitamin B-12 1000 MCG tablet Commonly known as: CYANOCOBALAMIN Take 1,000 mcg by mouth at bedtime.            Discharge Care Instructions  (From admission, onward)         Start     Ordered   12/24/20 0000  If the dressing is still on your incision site when you go home, remove  it on the third day after your surgery date. Remove dressing if it begins to fall off, or if it is dirty or damaged before the third day.        12/24/20 1004          Follow-up Information    Jeffrey Cagey, MD Follow up on 01/04/2021.   Specialty: General Surgery Why: staple removal  Contact information: [address] [facility]na [facility] [address] [phone]        Jeffrey Part, MD Follow up.   Specialty: Neurosurgery Why: Call to make an appointment with Dr. Zada Finders. He wanted to check on you 2-3 weeks after surgery.  Contact information: [place] [address] [phone]        Jeffrey Noble, MD. Schedule an appointment as soon as possible for a visit in 1 week(s).   Specialty: Internal Medicine Contact information: [address] [place] [address] [phone]              No Known Allergies  Consultations: Gen Surgery  Procedures/Studies: US Abdomen Complete  Result Date: 12/19/2020 CLINICAL DATA:  Pain.  Follow-up CT 12/18/2020. EXAM: ABDOMEN ULTRASOUND COMPLETE COMPARISON:  CT 12/18/2020. FINDINGS: Gallbladder: Sludge noted within the gallbladder. Tiny gallstones cannot be excluded. Echogenic foci within the gallbladder wall and within the gallbladder lumen could represent air. Gallbladder wall is thickened at 3.9 mm. Cholecystitis including emphysematous cholecystitis could present in this fashion. Negative Murphy sign. Common bile duct: Diameter: 4.8 mm Liver: Increased echogenicity consistent fatty infiltration or hepatocellular disease. 1.4 cm simple cyst again noted. Portal vein is patent on color Doppler imaging with normal direction of blood flow towards the liver. IVC: No abnormality visualized. Pancreas: Poorly visualized due to overlying bowel gas. Spleen: Size and appearance within normal limits. Right Kidney: Length: 12.0 cm. Echogenicity within normal limits. No mass or hydronephrosis visualized. Left Kidney:  Length: 14.0 cm. Renal size discrepancy most likely related to slight left renal rotation. Echogenicity within normal limits. No mass or hydronephrosis visualized. Abdominal aorta: No aneurysm visualized. Other findings: Very limited study due to patient's body habitus and bowel gas. IMPRESSION: 1. Sludge noted within the gall bladder. Tiny gallstones cannot be excluded. Echogenic foci within the gallbladder wall and within the gallbladder lumen could represent air. Gallbladder wall is thickened at 3.9 mm. Cholecystitis including emphysematous cholecystitis could present in this fashion. 2. Increased hepatic echogenicity consistent fatty infiltration or hepatocellular disease. These results will be called to the ordering clinician or representative by the Radiologist Assistant, and communication documented in the PACS or [employer]. Electronically Signed   By: Marcello Moores  Register   On: 12/19/2020 11:37   CT ABDOMEN PELVIS W CONTRAST  Result Date: 12/18/2020 CLINICAL DATA:  Abdominal pain EXAM: CT ABDOMEN AND PELVIS WITH CONTRAST TECHNIQUE: Multidetector CT imaging of the abdomen and pelvis was performed using the standard protocol following bolus administration of intravenous contrast. CONTRAST:  183mL OMNIPAQUE IOHEXOL 300 MG/ML  SOLN COMPARISON:  None. FINDINGS: Lower chest: Trace bilateral  pleural effusions. Bibasilar atelectasis. Heart is normal size. Small hiatal hernia. Hepatobiliary: Stranding noted around the gallbladder. Layering gallstone noted in the gallbladder near the gallbladder neck. Findings concerning for possible acute cholecystitis. Small cyst in the right hepatic dome. No suspicious hepatic abnormality. Pancreas: No focal abnormality or ductal dilatation. Spleen: No focal abnormality.  Normal size. Adrenals/Urinary Tract: Nodule in the right adrenal gland measures 1.5 cm. Left adrenal gland and kidneys unremarkable. Urinary bladder unremarkable. Stomach/Bowel: Stomach, large and small  bowel grossly unremarkable. Vascular/Lymphatic: Aortic atherosclerosis. No evidence of aneurysm or adenopathy. Reproductive: Radiation. Seeds in the region of the prostate which is mildly prominent Other: No free fluid or free air. Musculoskeletal: No acute and degenerative bony abnormality changes in the lumbar spine. Scoliosis IMPRESSION: Cholelithiasis. Stranding noted around the gallbladder concerning for acute cholecystitis. Trace bilateral pleural effusions.  Bibasilar atelectasis. Small hiatal hernia. Aortic atherosclerosis. Electronically Signed   By: Rolm Baptise M.D.   On: 12/18/2020 19:09      Discharge Exam: Vitals:   12/23/20 2031 12/24/20 0554  BP: (!) 164/89 (!) 156/88  Pulse: 67 62  Resp: 18 18  Temp: 98.6 F (37 C) 98.1 F (36.7 C)  SpO2: 99% 95%   Vitals:   12/23/20 0706 12/23/20 1349 12/23/20 2031 12/24/20 0554  BP: 138/78 120/65 (!) 164/89 (!) 156/88  Pulse: 65 82 67 62  Resp: 20 20 18 18   Temp: 97.8 F (36.6 C) 97.8 F (36.6 C) 98.6 F (37 C) 98.1 F (36.7 C)  TempSrc:  Oral Oral Oral  SpO2: 95% 97% 99% 95%  Weight:      Height:        General: Pt is alert, awake, not in acute distress Cardiovascular: RRR, S1/S2 +, no rubs, no gallops Respiratory: CTA bilaterally, no wheezing, no rhonchi Abdominal: Soft, NT, ND, bowel sounds +, incisions are c/d/i Extremities: no edema, no cyanosis    The results of significant diagnostics from this hospitalization (including imaging, microbiology, ancillary and laboratory) are listed below for reference.     Microbiology: Recent Results (from the past 240 hour(s))  Resp Panel by RT-PCR (Flu A&B, Covid) Nasopharyngeal Swab     Status: None   Collection Time: 12/18/20  9:15 PM   Specimen: Nasopharyngeal Swab; Nasopharyngeal(NP) swabs in vial transport medium  Result Value Ref Range Status   SARS Coronavirus 2 by RT PCR NEGATIVE NEGATIVE Final    Comment: (NOTE) SARS-CoV-2 target nucleic acids are NOT  DETECTED.  The SARS-CoV-2 RNA is generally detectable in upper respiratory specimens during the acute phase of infection. The lowest concentration of SARS-CoV-2 viral copies this assay can detect is 138 copies/mL. A negative result does not preclude SARS-Cov-2 infection and should not be used as the sole basis for treatment or other patient management decisions. A negative result may occur with  improper specimen collection/handling, submission of specimen other than nasopharyngeal swab, presence of viral mutation(s) within the areas targeted by this assay, and inadequate number of viral copies(<138 copies/mL). A negative result must be combined with clinical observations, patient history, and epidemiological information. The expected result is Negative.  Fact Sheet for Patients:  EntrepreneurPulse.com.au  Fact Sheet for Healthcare Providers:  IncredibleEmployment.be  This test is no t yet approved or cleared by the [place] FDA and  has been authorized for detection and/or diagnosis of SARS-CoV-2 by FDA under an Emergency Use Authorization (EUA). This EUA will remain  in effect (meaning this test can be used) for the duration of the  COVID-19 declaration under Section 564(b)(1) of the Act, 21 U.S.C.section 360bbb-3(b)(1), unless the authorization is terminated  or revoked sooner.       Influenza A by PCR NEGATIVE NEGATIVE Final   Influenza B by PCR NEGATIVE NEGATIVE Final    Comment: (NOTE) The Xpert Xpress SARS-CoV-2/FLU/RSV plus assay is intended as an aid in the diagnosis of influenza from Nasopharyngeal swab specimens and should not be used as a sole basis for treatment. Nasal washings and aspirates are unacceptable for Xpert Xpress SARS-CoV-2/FLU/RSV testing.  Fact Sheet for Patients: EntrepreneurPulse.com.au  Fact Sheet for Healthcare Providers: IncredibleEmployment.be  This test is not yet  approved or cleared by the [place] FDA and has been authorized for detection and/or diagnosis of SARS-CoV-2 by FDA under an Emergency Use Authorization (EUA). This EUA will remain in effect (meaning this test can be used) for the duration of the COVID-19 declaration under Section 564(b)(1) of the Act, 21 U.S.C. section 360bbb-3(b)(1), unless the authorization is terminated or revoked.  Performed at [facility], [address] [place], [place] [address]      Labs: BNP (last 3 results) No results for input(s): BNP in the last 8760 hours. Basic Metabolic Panel: Recent Labs  Lab 12/19/20 0524 12/20/20 0549 12/21/20 0657 12/22/20 0506 12/23/20 0555  NA 130* 133* 134* 134* 138  K 3.8 3.5 3.8 3.4* 3.8  CL 100 101 103 101 104  CO2 21* 22 21* 25 26  GLUCOSE 90 83 122* 108* 101*  BUN 14 13 19 19 13   CREATININE 0.87 0.88 0.91 0.94 0.96  CALCIUM 8.6* 8.6* 8.7* 8.8* 8.9  MG 1.6*  --  1.9 1.8 1.9   Liver Function Tests: Recent Labs  Lab 12/18/20 1615 12/19/20 0524 12/20/20 0549 12/21/20 0657  AST 30 21 17 17   ALT 24 19 17 18   ALKPHOS 95 91 97 96  BILITOT 1.4* 1.0 1.0 0.9  PROT 6.9 5.8* 5.7* 5.6*  ALBUMIN 3.6 3.0* 2.8* 2.5*   Recent Labs  Lab 12/18/20 1615  LIPASE 18   No results for input(s): AMMONIA in the last 168 hours. CBC: Recent Labs  Lab 12/18/20 1615 12/19/20 0524 12/20/20 0549 12/21/20 0657 12/22/20 0506 12/23/20 0555 12/24/20 0424  WBC 25.3*   < > 16.7* 14.9* 14.3* 10.6* 10.2  NEUTROABS 19.2*  --  11.9* 11.2*  --   --   --   HGB 14.2   < > 13.0 12.8* 12.1* 13.4 12.5*  HCT 40.2   < > 38.3* 36.8* 35.6* 39.9 37.2*  MCV 91.8   < > 93.4 92.5 93.0 94.3 94.4  PLT 230   < > 237 296 330 371 374   < > = values in this interval not displayed.   Cardiac Enzymes: No results for input(s): CKTOTAL, CKMB, CKMBINDEX, TROPONINI in the last 168 hours. BNP: Invalid input(s): POCBNP CBG: No results for input(s): GLUCAP in the last 168 hours. D-Dimer No  results for input(s): DDIMER in the last 72 hours. Hgb A1c No results for input(s): HGBA1C in the last 72 hours. Lipid Profile No results for input(s): CHOL, HDL, LDLCALC, TRIG, CHOLHDL, LDLDIRECT in the last 72 hours. Thyroid function studies No results for input(s): TSH, T4TOTAL, T3FREE, THYROIDAB in the last 72 hours.  Invalid input(s): FREET3 Anemia work up No results for input(s): VITAMINB12, FOLATE, FERRITIN, TIBC, IRON, RETICCTPCT in the last 72 hours. Urinalysis    Component Value Date/Time   COLORURINE AMBER (A) 12/18/2020 1642   APPEARANCEUR HAZY (A) 12/18/2020 1642  LABSPEC 1.034 (H) 12/18/2020 1642   PHURINE 5.0 12/18/2020 1642   GLUCOSEU NEGATIVE 12/18/2020 1642   HGBUR SMALL (A) 12/18/2020 1642   BILIRUBINUR NEGATIVE 12/18/2020 1642   KETONESUR 5 (A) 12/18/2020 1642   PROTEINUR 100 (A) 12/18/2020 1642   UROBILINOGEN 0.2 09/14/2014 1056   NITRITE NEGATIVE 12/18/2020 1642   LEUKOCYTESUR NEGATIVE 12/18/2020 1642   Sepsis Labs Invalid input(s): PROCALCITONIN,  WBC,  LACTICIDVEN Microbiology Recent Results (from the past 240 hour(s))  Resp Panel by RT-PCR (Flu A&B, Covid) Nasopharyngeal Swab     Status: None   Collection Time: 12/18/20  9:15 PM   Specimen: Nasopharyngeal Swab; Nasopharyngeal(NP) swabs in vial transport medium  Result Value Ref Range Status   SARS Coronavirus 2 by RT PCR NEGATIVE NEGATIVE Final    Comment: (NOTE) SARS-CoV-2 target nucleic acids are NOT DETECTED.  The SARS-CoV-2 RNA is generally detectable in upper respiratory specimens during the acute phase of infection. The lowest concentration of SARS-CoV-2 viral copies this assay can detect is 138 copies/mL. A negative result does not preclude SARS-Cov-2 infection and should not be used as the sole basis for treatment or other patient management decisions. A negative result may occur with  improper specimen collection/handling, submission of specimen other than nasopharyngeal swab, presence  of viral mutation(s) within the areas targeted by this assay, and inadequate number of viral copies(<138 copies/mL). A negative result must be combined with clinical observations, patient history, and epidemiological information. The expected result is Negative.  Fact Sheet for Patients:  EntrepreneurPulse.com.au  Fact Sheet for Healthcare Providers:  IncredibleEmployment.be  This test is no t yet approved or cleared by the [place] FDA and  has been authorized for detection and/or diagnosis of SARS-CoV-2 by FDA under an Emergency Use Authorization (EUA). This EUA will remain  in effect (meaning this test can be used) for the duration of the COVID-19 declaration under Section 564(b)(1) of the Act, 21 U.S.C.section 360bbb-3(b)(1), unless the authorization is terminated  or revoked sooner.       Influenza A by PCR NEGATIVE NEGATIVE Final   Influenza B by PCR NEGATIVE NEGATIVE Final    Comment: (NOTE) The Xpert Xpress SARS-CoV-2/FLU/RSV plus assay is intended as an aid in the diagnosis of influenza from Nasopharyngeal swab specimens and should not be used as a sole basis for treatment. Nasal washings and aspirates are unacceptable for Xpert Xpress SARS-CoV-2/FLU/RSV testing.  Fact Sheet for Patients: EntrepreneurPulse.com.au  Fact Sheet for Healthcare Providers: IncredibleEmployment.be  This test is not yet approved or cleared by the [place] FDA and has been authorized for detection and/or diagnosis of SARS-CoV-2 by FDA under an Emergency Use Authorization (EUA). This EUA will remain in effect (meaning this test can be used) for the duration of the COVID-19 declaration under Section 564(b)(1) of the Act, 21 U.S.C. section 360bbb-3(b)(1), unless the authorization is terminated or revoked.  Performed at [facility], [address] [place], [place] [address]      Time coordinating discharge:  35 minutes  SIGNED:   Rodena Goldmann, DO [facility] 12/24/2020, 10:06 AM  If 7PM-7AM, please contact night-coverage [email or web address]

## 2020-12-24 NOTE — TOC Transition Note (Signed)
Transition of Care [facility]) - CM/SW Discharge Note   Patient Details  Name: Jeffrey Wheeler MRN: [id number] Date of Birth: [birth year]  [facility] CM/SW Contact:  Natasha Bence, LCSW Phone Number: 12/24/2020, 11:07 AM   Clinical Narrative:    CSW notified of patient's readiness for discharge. CSW notified Toney Sang on call services of discharge. TOC signing off.   Final next level of care: [place] Barriers to Discharge: Barriers Resolved   Patient Goals and CMS Choice Patient states their goals for this hospitalization and ongoing recovery are:: Return home with [facility] CMS Medicare.gov Compare Post Acute Care list provided to:: Patient Choice offered to / list presented to : Patient  Discharge Placement                    Patient and family notified of of transfer: 12/24/20  Discharge Plan and Services     Post Acute Care Choice: Durable Medical Equipment          DME Arranged: N/A DME Agency: NA       HH Arranged: RN,PT [place] Agency: [facility] Date [place]: 12/24/20 Time [place]: 58 Representative spoke with at [place]: On call service  Social Determinants of Health ([place]) Interventions     Readmission Risk Interventions Readmission Risk Prevention Plan 12/22/2020  Medication Screening Complete  Transportation Screening Complete  Some recent data might be hidden

## 2020-12-27 DIAGNOSIS — G912 (Idiopathic) normal pressure hydrocephalus: Secondary | ICD-10-CM | POA: Diagnosis not present

## 2020-12-29 DIAGNOSIS — Z48815 Encounter for surgical aftercare following surgery on the digestive system: Secondary | ICD-10-CM | POA: Diagnosis not present

## 2020-12-29 DIAGNOSIS — K219 Gastro-esophageal reflux disease without esophagitis: Secondary | ICD-10-CM | POA: Diagnosis not present

## 2020-12-29 DIAGNOSIS — N429 Disorder of prostate, unspecified: Secondary | ICD-10-CM | POA: Diagnosis not present

## 2020-12-29 DIAGNOSIS — M1711 Unilateral primary osteoarthritis, right knee: Secondary | ICD-10-CM | POA: Diagnosis not present

## 2020-12-29 DIAGNOSIS — K810 Acute cholecystitis: Secondary | ICD-10-CM | POA: Diagnosis not present

## 2020-12-29 DIAGNOSIS — G629 Polyneuropathy, unspecified: Secondary | ICD-10-CM | POA: Diagnosis not present

## 2020-12-29 DIAGNOSIS — K829 Disease of gallbladder, unspecified: Secondary | ICD-10-CM | POA: Diagnosis not present

## 2020-12-29 DIAGNOSIS — H409 Unspecified glaucoma: Secondary | ICD-10-CM | POA: Diagnosis not present

## 2020-12-29 DIAGNOSIS — G912 (Idiopathic) normal pressure hydrocephalus: Secondary | ICD-10-CM | POA: Diagnosis not present

## 2021-01-01 DIAGNOSIS — K829 Disease of gallbladder, unspecified: Secondary | ICD-10-CM | POA: Diagnosis not present

## 2021-01-01 DIAGNOSIS — M1711 Unilateral primary osteoarthritis, right knee: Secondary | ICD-10-CM | POA: Diagnosis not present

## 2021-01-01 DIAGNOSIS — G629 Polyneuropathy, unspecified: Secondary | ICD-10-CM | POA: Diagnosis not present

## 2021-01-01 DIAGNOSIS — K810 Acute cholecystitis: Secondary | ICD-10-CM | POA: Diagnosis not present

## 2021-01-01 DIAGNOSIS — Z48815 Encounter for surgical aftercare following surgery on the digestive system: Secondary | ICD-10-CM | POA: Diagnosis not present

## 2021-01-01 DIAGNOSIS — H409 Unspecified glaucoma: Secondary | ICD-10-CM | POA: Diagnosis not present

## 2021-01-01 DIAGNOSIS — N429 Disorder of prostate, unspecified: Secondary | ICD-10-CM | POA: Diagnosis not present

## 2021-01-01 DIAGNOSIS — G912 (Idiopathic) normal pressure hydrocephalus: Secondary | ICD-10-CM | POA: Diagnosis not present

## 2021-01-01 DIAGNOSIS — K819 Cholecystitis, unspecified: Secondary | ICD-10-CM | POA: Diagnosis not present

## 2021-01-01 DIAGNOSIS — K219 Gastro-esophageal reflux disease without esophagitis: Secondary | ICD-10-CM | POA: Diagnosis not present

## 2021-01-03 DIAGNOSIS — N429 Disorder of prostate, unspecified: Secondary | ICD-10-CM | POA: Diagnosis not present

## 2021-01-03 DIAGNOSIS — G629 Polyneuropathy, unspecified: Secondary | ICD-10-CM | POA: Diagnosis not present

## 2021-01-03 DIAGNOSIS — M1711 Unilateral primary osteoarthritis, right knee: Secondary | ICD-10-CM | POA: Diagnosis not present

## 2021-01-03 DIAGNOSIS — K829 Disease of gallbladder, unspecified: Secondary | ICD-10-CM | POA: Diagnosis not present

## 2021-01-03 DIAGNOSIS — Z48815 Encounter for surgical aftercare following surgery on the digestive system: Secondary | ICD-10-CM | POA: Diagnosis not present

## 2021-01-03 DIAGNOSIS — G912 (Idiopathic) normal pressure hydrocephalus: Secondary | ICD-10-CM | POA: Diagnosis not present

## 2021-01-03 DIAGNOSIS — K810 Acute cholecystitis: Secondary | ICD-10-CM | POA: Diagnosis not present

## 2021-01-03 DIAGNOSIS — K219 Gastro-esophageal reflux disease without esophagitis: Secondary | ICD-10-CM | POA: Diagnosis not present

## 2021-01-03 DIAGNOSIS — H409 Unspecified glaucoma: Secondary | ICD-10-CM | POA: Diagnosis not present

## 2021-01-04 ENCOUNTER — Ambulatory Visit (INDEPENDENT_AMBULATORY_CARE_PROVIDER_SITE_OTHER): Payer: Medicare PPO | Admitting: General Surgery

## 2021-01-04 ENCOUNTER — Encounter: Payer: Self-pay | Admitting: General Surgery

## 2021-01-04 ENCOUNTER — Other Ambulatory Visit: Payer: Self-pay

## 2021-01-04 VITALS — BP 135/88 | HR 56 | Temp 97.6°F | Resp 14 | Ht 70.0 in | Wt 214.0 lb

## 2021-01-04 DIAGNOSIS — K810 Acute cholecystitis: Secondary | ICD-10-CM

## 2021-01-04 NOTE — Patient Instructions (Signed)
Steri strips will fall off in 5-7 days, ok to remove once peeling. Ok to shower. Keep stools regular and soft. May need over the counter stool softener. Activity and diet as tolerated.

## 2021-01-04 NOTE — Progress Notes (Signed)
[facility] Note   HPI:  85 y.o. Male presents to clinic for post-op follow-up evaluation after laparoscopic cholecystectomy. Patient reports doing well and starting to eat more and feel better. He is out of the hospital bed at home. He saw Dr. Zada Finders after surgery.   Review of Systems:  No fever or chills Some soreness  All other review of systems: otherwise negative   Vital Signs:  BP 135/88   Pulse (!) 56   Temp 97.6 F (36.4 C) (Other (Comment))   Resp 14   Ht 5\' 10"  (1.778 m)   Wt 214 lb (97.1 kg)   SpO2 96%   BMI 30.71 kg/m    Physical Exam:  Physical Exam Vitals reviewed.  Cardiovascular:     Rate and Rhythm: Normal rate.  Pulmonary:     Effort: Pulmonary effort is normal.  Abdominal:     General: There is no distension.     Palpations: Abdomen is soft.     Tenderness: There is no abdominal tenderness.     Comments: Reactive irritation at staple sites, remove staples, no extending erythema or drainage, steri strips placed  Neurological:     Mental Status: He is alert.      Assessment:  85 y.o. yo Male s.p laparoscopic cholecystectomy for gangrenous cholecystitis. Doing well. Feeling better. Staple sites with some irritation but no signs of cellulitis. Monitor.   Plan:  Steri strips will fall off in 5-7 days, ok to remove once peeling. Ok to shower. Keep stools regular and soft. May need over the counter stool softener. Activity and diet as tolerated.   Curlene Labrum, MD [facility] [address] [place], [place] [address]

## 2021-01-08 DIAGNOSIS — G629 Polyneuropathy, unspecified: Secondary | ICD-10-CM | POA: Diagnosis not present

## 2021-01-08 DIAGNOSIS — M1711 Unilateral primary osteoarthritis, right knee: Secondary | ICD-10-CM | POA: Diagnosis not present

## 2021-01-08 DIAGNOSIS — G912 (Idiopathic) normal pressure hydrocephalus: Secondary | ICD-10-CM | POA: Diagnosis not present

## 2021-01-08 DIAGNOSIS — N429 Disorder of prostate, unspecified: Secondary | ICD-10-CM | POA: Diagnosis not present

## 2021-01-08 DIAGNOSIS — K829 Disease of gallbladder, unspecified: Secondary | ICD-10-CM | POA: Diagnosis not present

## 2021-01-08 DIAGNOSIS — K810 Acute cholecystitis: Secondary | ICD-10-CM | POA: Diagnosis not present

## 2021-01-08 DIAGNOSIS — K219 Gastro-esophageal reflux disease without esophagitis: Secondary | ICD-10-CM | POA: Diagnosis not present

## 2021-01-08 DIAGNOSIS — H409 Unspecified glaucoma: Secondary | ICD-10-CM | POA: Diagnosis not present

## 2021-01-08 DIAGNOSIS — Z48815 Encounter for surgical aftercare following surgery on the digestive system: Secondary | ICD-10-CM | POA: Diagnosis not present

## 2021-01-10 DIAGNOSIS — K219 Gastro-esophageal reflux disease without esophagitis: Secondary | ICD-10-CM | POA: Diagnosis not present

## 2021-01-10 DIAGNOSIS — G912 (Idiopathic) normal pressure hydrocephalus: Secondary | ICD-10-CM | POA: Diagnosis not present

## 2021-01-10 DIAGNOSIS — K810 Acute cholecystitis: Secondary | ICD-10-CM | POA: Diagnosis not present

## 2021-01-10 DIAGNOSIS — H409 Unspecified glaucoma: Secondary | ICD-10-CM | POA: Diagnosis not present

## 2021-01-10 DIAGNOSIS — N429 Disorder of prostate, unspecified: Secondary | ICD-10-CM | POA: Diagnosis not present

## 2021-01-10 DIAGNOSIS — K829 Disease of gallbladder, unspecified: Secondary | ICD-10-CM | POA: Diagnosis not present

## 2021-01-10 DIAGNOSIS — Z48815 Encounter for surgical aftercare following surgery on the digestive system: Secondary | ICD-10-CM | POA: Diagnosis not present

## 2021-01-10 DIAGNOSIS — G629 Polyneuropathy, unspecified: Secondary | ICD-10-CM | POA: Diagnosis not present

## 2021-01-10 DIAGNOSIS — M1711 Unilateral primary osteoarthritis, right knee: Secondary | ICD-10-CM | POA: Diagnosis not present

## 2021-01-11 DIAGNOSIS — L570 Actinic keratosis: Secondary | ICD-10-CM | POA: Diagnosis not present

## 2021-01-31 DIAGNOSIS — G912 (Idiopathic) normal pressure hydrocephalus: Secondary | ICD-10-CM | POA: Diagnosis not present

## 2021-01-31 DIAGNOSIS — J069 Acute upper respiratory infection, unspecified: Secondary | ICD-10-CM | POA: Diagnosis not present

## 2021-03-01 DIAGNOSIS — G912 (Idiopathic) normal pressure hydrocephalus: Secondary | ICD-10-CM | POA: Diagnosis not present

## 2021-03-16 DIAGNOSIS — H6123 Impacted cerumen, bilateral: Secondary | ICD-10-CM | POA: Diagnosis not present

## 2021-04-03 DIAGNOSIS — G912 (Idiopathic) normal pressure hydrocephalus: Secondary | ICD-10-CM | POA: Diagnosis not present

## 2021-04-03 DIAGNOSIS — U071 COVID-19: Secondary | ICD-10-CM | POA: Diagnosis not present

## 2021-04-03 DIAGNOSIS — M48061 Spinal stenosis, lumbar region without neurogenic claudication: Secondary | ICD-10-CM | POA: Diagnosis not present

## 2021-04-17 DIAGNOSIS — H401132 Primary open-angle glaucoma, bilateral, moderate stage: Secondary | ICD-10-CM | POA: Diagnosis not present

## 2021-04-17 DIAGNOSIS — B0052 Herpesviral keratitis: Secondary | ICD-10-CM | POA: Diagnosis not present

## 2021-06-06 DIAGNOSIS — L988 Other specified disorders of the skin and subcutaneous tissue: Secondary | ICD-10-CM | POA: Diagnosis not present

## 2021-06-06 DIAGNOSIS — L570 Actinic keratosis: Secondary | ICD-10-CM | POA: Diagnosis not present

## 2021-06-06 DIAGNOSIS — C44319 Basal cell carcinoma of skin of other parts of face: Secondary | ICD-10-CM | POA: Diagnosis not present

## 2021-06-06 DIAGNOSIS — L209 Atopic dermatitis, unspecified: Secondary | ICD-10-CM | POA: Diagnosis not present

## 2021-06-13 DIAGNOSIS — H6123 Impacted cerumen, bilateral: Secondary | ICD-10-CM | POA: Diagnosis not present

## 2021-06-13 DIAGNOSIS — B369 Superficial mycosis, unspecified: Secondary | ICD-10-CM | POA: Diagnosis not present

## 2021-06-13 DIAGNOSIS — C44329 Squamous cell carcinoma of skin of other parts of face: Secondary | ICD-10-CM | POA: Diagnosis not present

## 2021-06-28 DIAGNOSIS — Z79899 Other long term (current) drug therapy: Secondary | ICD-10-CM | POA: Diagnosis not present

## 2021-06-28 DIAGNOSIS — H409 Unspecified glaucoma: Secondary | ICD-10-CM | POA: Diagnosis not present

## 2021-06-28 DIAGNOSIS — M1990 Unspecified osteoarthritis, unspecified site: Secondary | ICD-10-CM | POA: Diagnosis not present

## 2021-06-28 DIAGNOSIS — E785 Hyperlipidemia, unspecified: Secondary | ICD-10-CM | POA: Diagnosis not present

## 2021-06-28 DIAGNOSIS — Z125 Encounter for screening for malignant neoplasm of prostate: Secondary | ICD-10-CM | POA: Diagnosis not present

## 2021-07-04 DIAGNOSIS — B369 Superficial mycosis, unspecified: Secondary | ICD-10-CM | POA: Diagnosis not present

## 2021-07-18 DIAGNOSIS — Z6830 Body mass index (BMI) 30.0-30.9, adult: Secondary | ICD-10-CM | POA: Diagnosis not present

## 2021-07-18 DIAGNOSIS — N401 Enlarged prostate with lower urinary tract symptoms: Secondary | ICD-10-CM | POA: Diagnosis not present

## 2021-07-18 DIAGNOSIS — G912 (Idiopathic) normal pressure hydrocephalus: Secondary | ICD-10-CM | POA: Diagnosis not present

## 2021-07-18 DIAGNOSIS — M48061 Spinal stenosis, lumbar region without neurogenic claudication: Secondary | ICD-10-CM | POA: Diagnosis not present

## 2021-07-18 DIAGNOSIS — L299 Pruritus, unspecified: Secondary | ICD-10-CM | POA: Diagnosis not present

## 2021-08-21 DIAGNOSIS — H903 Sensorineural hearing loss, bilateral: Secondary | ICD-10-CM | POA: Diagnosis not present

## 2021-08-21 DIAGNOSIS — Z01118 Encounter for examination of ears and hearing with other abnormal findings: Secondary | ICD-10-CM | POA: Diagnosis not present

## 2021-10-23 DIAGNOSIS — H401132 Primary open-angle glaucoma, bilateral, moderate stage: Secondary | ICD-10-CM | POA: Diagnosis not present

## 2021-11-09 ENCOUNTER — Other Ambulatory Visit: Payer: Self-pay | Admitting: Neurological Surgery

## 2021-11-09 DIAGNOSIS — H6121 Impacted cerumen, right ear: Secondary | ICD-10-CM | POA: Diagnosis not present

## 2021-11-09 DIAGNOSIS — H903 Sensorineural hearing loss, bilateral: Secondary | ICD-10-CM | POA: Diagnosis not present

## 2021-11-09 DIAGNOSIS — G912 (Idiopathic) normal pressure hydrocephalus: Secondary | ICD-10-CM

## 2021-11-15 DIAGNOSIS — G912 (Idiopathic) normal pressure hydrocephalus: Secondary | ICD-10-CM | POA: Diagnosis not present

## 2021-11-15 DIAGNOSIS — G500 Trigeminal neuralgia: Secondary | ICD-10-CM | POA: Diagnosis not present

## 2021-11-29 DIAGNOSIS — N401 Enlarged prostate with lower urinary tract symptoms: Secondary | ICD-10-CM | POA: Diagnosis not present

## 2021-11-29 DIAGNOSIS — R350 Frequency of micturition: Secondary | ICD-10-CM | POA: Diagnosis not present

## 2021-12-05 DIAGNOSIS — L209 Atopic dermatitis, unspecified: Secondary | ICD-10-CM | POA: Diagnosis not present

## 2021-12-05 DIAGNOSIS — L570 Actinic keratosis: Secondary | ICD-10-CM | POA: Diagnosis not present

## 2021-12-10 ENCOUNTER — Ambulatory Visit
Admission: RE | Admit: 2021-12-10 | Discharge: 2021-12-10 | Disposition: A | Discharge status: Home / Self Care | Payer: Medicare PPO | Source: Ambulatory Visit | Attending: Neurological Surgery | Admitting: Neurological Surgery

## 2021-12-10 DIAGNOSIS — R519 Headache, unspecified: Secondary | ICD-10-CM | POA: Diagnosis not present

## 2021-12-10 DIAGNOSIS — G912 (Idiopathic) normal pressure hydrocephalus: Secondary | ICD-10-CM

## 2021-12-18 DIAGNOSIS — M2011 Hallux valgus (acquired), right foot: Secondary | ICD-10-CM | POA: Diagnosis not present

## 2021-12-18 DIAGNOSIS — M2041 Other hammer toe(s) (acquired), right foot: Secondary | ICD-10-CM | POA: Diagnosis not present

## 2021-12-18 DIAGNOSIS — M2459 Contracture, other specified joint: Secondary | ICD-10-CM | POA: Diagnosis not present

## 2021-12-27 DIAGNOSIS — G912 (Idiopathic) normal pressure hydrocephalus: Secondary | ICD-10-CM | POA: Diagnosis not present

## 2022-02-14 DIAGNOSIS — M5481 Occipital neuralgia: Secondary | ICD-10-CM | POA: Diagnosis not present

## 2022-02-14 DIAGNOSIS — G912 (Idiopathic) normal pressure hydrocephalus: Secondary | ICD-10-CM | POA: Diagnosis not present

## 2022-02-26 DIAGNOSIS — H401132 Primary open-angle glaucoma, bilateral, moderate stage: Secondary | ICD-10-CM | POA: Diagnosis not present

## 2022-03-08 DIAGNOSIS — H903 Sensorineural hearing loss, bilateral: Secondary | ICD-10-CM | POA: Diagnosis not present

## 2022-05-23 ENCOUNTER — Encounter: Payer: Self-pay | Admitting: *Deleted

## 2022-05-23 ENCOUNTER — Telehealth: Payer: Self-pay | Admitting: *Deleted

## 2022-05-23 NOTE — Patient Instructions (Signed)
Visit Information  Thank you for taking time to visit with me today. Please don't hesitate to contact me if I can be of assistance to you before our next scheduled telephone appointment.  Following are the goals we discussed today:  Continue to have yearly Annual Wellness visits with PCP office  Our next appointment is by telephone on 10/20  Please call the care guide team at [phone] if you need to cancel or reschedule your appointment.   Please call the Suicide and Crisis Lifeline: 988 call the [place] National Suicide Prevention Lifeline: 905-664-7005 or TTY: 337-145-0449 TTY (321) 050-8062) to talk to a trained counselor call 1-800-273-TALK (toll free, 24 hour hotline) call the [facility]: [phone] if you are experiencing a [place] or [place] or need someone to talk to.  Patient verbalizes understanding of instructions and care plan provided today and agrees to view in [place]. Active MyChart status and patient understanding of how to access instructions and care plan via MyChart confirmed with patient.     The patient has been provided with contact information for the care management team and has been advised to call with any health related questions or concerns.   Valente David, RN, MSN, [place] Care Management Care Management Coordinator [phone]

## 2022-05-23 NOTE — Patient Outreach (Signed)
  Care Coordination   Initial Visit Note   05/23/2022 Name: Jeffrey Wheeler MRN: [id number] DOB: 04-16-1936  Jeffrey Wheeler is a 86 y.o. year old male who sees Asencion Noble, MD for primary care. I spoke with  [name] by phone today.  What matters to the patients health and wellness today?  Report doing well overall.  Has chronic back pain, scheduled to see pain management specialist on Friday, 9/8.  Currently relieved with Tramadol and Tylenol daily. Report AWV was completed this year.    Goals Addressed               This Visit's Progress     Decrease chronic back pain (pt-stated)        Care Coordination Interventions: Reviewed provider established plan for pain management Discussed importance of adherence to all scheduled medical appointments Counseled on the importance of reporting any/all new or changed pain symptoms or management strategies to pain management provider Advised patient to report to care team affect of pain on daily activities Reviewed with patient prescribed pharmacological and nonpharmacological pain relief strategies Assessed social determinant of health barriers         SDOH assessments and interventions completed:  Yes  SDOH Interventions Today    Flowsheet Row Most Recent Value  SDOH Interventions   Food Insecurity Interventions Intervention Not Indicated  Housing Interventions Intervention Not Indicated  Transportation Interventions Intervention Not Indicated  Utilities Interventions Intervention Not Indicated        Care Coordination Interventions Activated:  Yes  Care Coordination Interventions:  Yes, provided   Follow up plan: Follow up call scheduled for 10/20    Encounter Outcome:  Pt. Visit Completed   Valente David, RN, MSN, [place] Care Management Care Management Coordinator [phone]

## 2022-05-24 DIAGNOSIS — M545 Low back pain, unspecified: Secondary | ICD-10-CM | POA: Diagnosis not present

## 2022-06-12 DIAGNOSIS — B351 Tinea unguium: Secondary | ICD-10-CM | POA: Diagnosis not present

## 2022-06-12 DIAGNOSIS — L209 Atopic dermatitis, unspecified: Secondary | ICD-10-CM | POA: Diagnosis not present

## 2022-06-12 DIAGNOSIS — L570 Actinic keratosis: Secondary | ICD-10-CM | POA: Diagnosis not present

## 2022-06-14 ENCOUNTER — Other Ambulatory Visit: Payer: Self-pay | Admitting: Orthopedic Surgery

## 2022-06-14 DIAGNOSIS — M545 Low back pain, unspecified: Secondary | ICD-10-CM

## 2022-07-02 ENCOUNTER — Ambulatory Visit
Admission: RE | Admit: 2022-07-02 | Discharge: 2022-07-02 | Disposition: A | Discharge status: Home / Self Care | Payer: Medicare PPO | Source: Ambulatory Visit | Attending: Orthopedic Surgery | Admitting: Orthopedic Surgery

## 2022-07-02 DIAGNOSIS — M545 Low back pain, unspecified: Secondary | ICD-10-CM | POA: Diagnosis not present

## 2022-07-02 DIAGNOSIS — M48061 Spinal stenosis, lumbar region without neurogenic claudication: Secondary | ICD-10-CM | POA: Diagnosis not present

## 2022-07-02 DIAGNOSIS — R29898 Other symptoms and signs involving the musculoskeletal system: Secondary | ICD-10-CM | POA: Diagnosis not present

## 2022-07-04 DIAGNOSIS — Z6832 Body mass index (BMI) 32.0-32.9, adult: Secondary | ICD-10-CM | POA: Diagnosis not present

## 2022-07-04 DIAGNOSIS — G912 (Idiopathic) normal pressure hydrocephalus: Secondary | ICD-10-CM | POA: Diagnosis not present

## 2022-07-05 ENCOUNTER — Ambulatory Visit: Payer: Self-pay | Admitting: *Deleted

## 2022-07-05 NOTE — Patient Instructions (Signed)
Visit Information  Thank you for taking time to visit with me today. Please don't hesitate to contact me if I can be of assistance to you before our next scheduled telephone appointment.  Following are the goals we discussed today:  Keep and attend follow up with ortho.   Call PCP office to schedule follow up  [place] next appointment is by telephone on 11/13  Please call the care guide team at [phone] if you need to cancel or reschedule your appointment.   Please call the Suicide and Crisis Lifeline: 988 call the [place] National Suicide Prevention Lifeline: 564-295-6893 or TTY: (365)419-9005 TTY 828-326-2243) to talk to a trained counselor call 1-800-273-TALK (toll free, 24 hour hotline) call the [facility]: [phone] call 911 if you are experiencing a Mental Health or [place] or need someone to talk to.  Patient verbalizes understanding of instructions and care plan provided today and agrees to view in [place]. Active MyChart status and patient understanding of how to access instructions and care plan via MyChart confirmed with patient.     The patient has been provided with contact information for the care management team and has been advised to call with any health related questions or concerns.   Valente David, RN, MSN, [place] Care Management Care Management Coordinator [phone]

## 2022-07-05 NOTE — Patient Outreach (Signed)
  Care Coordination   Follow Up Visit Note   07/05/2022 Name: Fredrich Cory MRN: [id number] DOB: 21-Jul-1936  Deroy Noah is a 86 y.o. year old male who sees Asencion Noble, MD for primary care. I spoke with  [name] by phone today.  What matters to the patients health and wellness today?  Continues to have chronic pain, hoping injections will help.  Denies any urgent concerns, encouraged to contact this care manager with questions.      Goals Addressed               This Visit's Progress     Decrease chronic back pain (pt-stated)   On track     Care Coordination Interventions: Reviewed provider established plan for pain management Discussed importance of adherence to all scheduled medical appointments Counseled on the importance of reporting any/all new or changed pain symptoms or management strategies to pain management provider Advised patient to report to care team affect of pain on daily activities Discussed completion of MRI this week, has appointment with ortho next week possibly to start back injections for pain          SDOH assessments and interventions completed:  No     Care Coordination Interventions Activated:  Yes  Care Coordination Interventions:  Yes, provided   Follow up plan: Follow up call scheduled for 11/13    Encounter Outcome:  Pt. Visit Completed   Valente David, RN, MSN, [place] Care Management Care Management Coordinator [phone]

## 2022-07-09 DIAGNOSIS — M5136 Other intervertebral disc degeneration, lumbar region: Secondary | ICD-10-CM | POA: Diagnosis not present

## 2022-07-09 DIAGNOSIS — M47816 Spondylosis without myelopathy or radiculopathy, lumbar region: Secondary | ICD-10-CM | POA: Diagnosis not present

## 2022-07-09 DIAGNOSIS — M5416 Radiculopathy, lumbar region: Secondary | ICD-10-CM | POA: Diagnosis not present

## 2022-07-09 DIAGNOSIS — M419 Scoliosis, unspecified: Secondary | ICD-10-CM | POA: Diagnosis not present

## 2022-07-09 DIAGNOSIS — M545 Low back pain, unspecified: Secondary | ICD-10-CM | POA: Diagnosis not present

## 2022-07-09 DIAGNOSIS — M48061 Spinal stenosis, lumbar region without neurogenic claudication: Secondary | ICD-10-CM | POA: Diagnosis not present

## 2022-07-10 DIAGNOSIS — L570 Actinic keratosis: Secondary | ICD-10-CM | POA: Diagnosis not present

## 2022-07-10 DIAGNOSIS — B351 Tinea unguium: Secondary | ICD-10-CM | POA: Diagnosis not present

## 2022-07-13 DIAGNOSIS — J020 Streptococcal pharyngitis: Secondary | ICD-10-CM | POA: Diagnosis not present

## 2022-07-13 DIAGNOSIS — J029 Acute pharyngitis, unspecified: Secondary | ICD-10-CM | POA: Diagnosis not present

## 2022-07-22 DIAGNOSIS — M1990 Unspecified osteoarthritis, unspecified site: Secondary | ICD-10-CM | POA: Diagnosis not present

## 2022-07-22 DIAGNOSIS — N400 Enlarged prostate without lower urinary tract symptoms: Secondary | ICD-10-CM | POA: Diagnosis not present

## 2022-07-22 DIAGNOSIS — M5481 Occipital neuralgia: Secondary | ICD-10-CM | POA: Diagnosis not present

## 2022-07-22 DIAGNOSIS — Z125 Encounter for screening for malignant neoplasm of prostate: Secondary | ICD-10-CM | POA: Diagnosis not present

## 2022-07-22 DIAGNOSIS — K219 Gastro-esophageal reflux disease without esophagitis: Secondary | ICD-10-CM | POA: Diagnosis not present

## 2022-07-22 DIAGNOSIS — G912 (Idiopathic) normal pressure hydrocephalus: Secondary | ICD-10-CM | POA: Diagnosis not present

## 2022-07-26 DIAGNOSIS — M48061 Spinal stenosis, lumbar region without neurogenic claudication: Secondary | ICD-10-CM | POA: Diagnosis not present

## 2022-07-26 DIAGNOSIS — Z23 Encounter for immunization: Secondary | ICD-10-CM | POA: Diagnosis not present

## 2022-07-26 DIAGNOSIS — G912 (Idiopathic) normal pressure hydrocephalus: Secondary | ICD-10-CM | POA: Diagnosis not present

## 2022-07-26 DIAGNOSIS — N400 Enlarged prostate without lower urinary tract symptoms: Secondary | ICD-10-CM | POA: Diagnosis not present

## 2022-07-26 DIAGNOSIS — K219 Gastro-esophageal reflux disease without esophagitis: Secondary | ICD-10-CM | POA: Diagnosis not present

## 2022-07-29 ENCOUNTER — Ambulatory Visit: Payer: Self-pay | Admitting: *Deleted

## 2022-07-29 ENCOUNTER — Encounter: Payer: Self-pay | Admitting: *Deleted

## 2022-07-29 NOTE — Patient Outreach (Signed)
  Care Coordination   Follow Up Visit Note   07/29/2022 Name: Jeffrey Wheeler MRN: [id number] DOB: 1936/09/09  Jeffrey Wheeler is a 86 y.o. year old male who sees Jeffrey Noble, MD for primary care. I spoke with  Jeffrey Wheeler by phone today.  What matters to the patients health and wellness today?  Managing back pain    Goals Addressed               This Visit's Progress     Patient Stated     COMPLETED: Manage Chronic Back Pain (No follow up required) (pt-stated)        Care Coordination Interventions: Reviewed provider established plan for pain management Discussed importance of adherence to all scheduled medical appointments Counseled on the importance of reporting any/all new or changed pain symptoms or management strategies to pain management provider Advised patient to report to care team affect of pain on daily activities Reviewed relevant imaging reports  Discussed recent ortho visits and recommendation for injections vs a spinal cord stimulator. Patient declined stimulator but is agreeable to injections Assessed additional care coordination needs. Patient denied any at this time. Provided with [facility] telephone number and encouraged to reach out as needed          SDOH assessments and interventions completed:  Yes  SDOH Interventions Today    Flowsheet Row Most Recent Value  SDOH Interventions   Housing Interventions Intervention Not Indicated  Transportation Interventions Intervention Not Indicated        Care Coordination Interventions Activated:  Yes  Care Coordination Interventions:  Yes, provided   Follow up plan: No further intervention required.   Encounter Outcome:  Pt. Visit Completed   Jeffrey Wheeler, BSN, RN-BC RN Care Coordinator [place] Direct Dial: [phone] Main #: [phone]

## 2022-08-12 IMAGING — CR DG CHEST 1V
1 series · 1 of 1 positions shown · non-contrast
Comparison: Postoperative skull series.

CLINICAL DATA: 83-year-old male postoperative day zero status post
right ventriculoperitoneal shunt placement for normal pressure
hydrocephalus.

EXAM:
CHEST  1 VIEW

[chest ap]
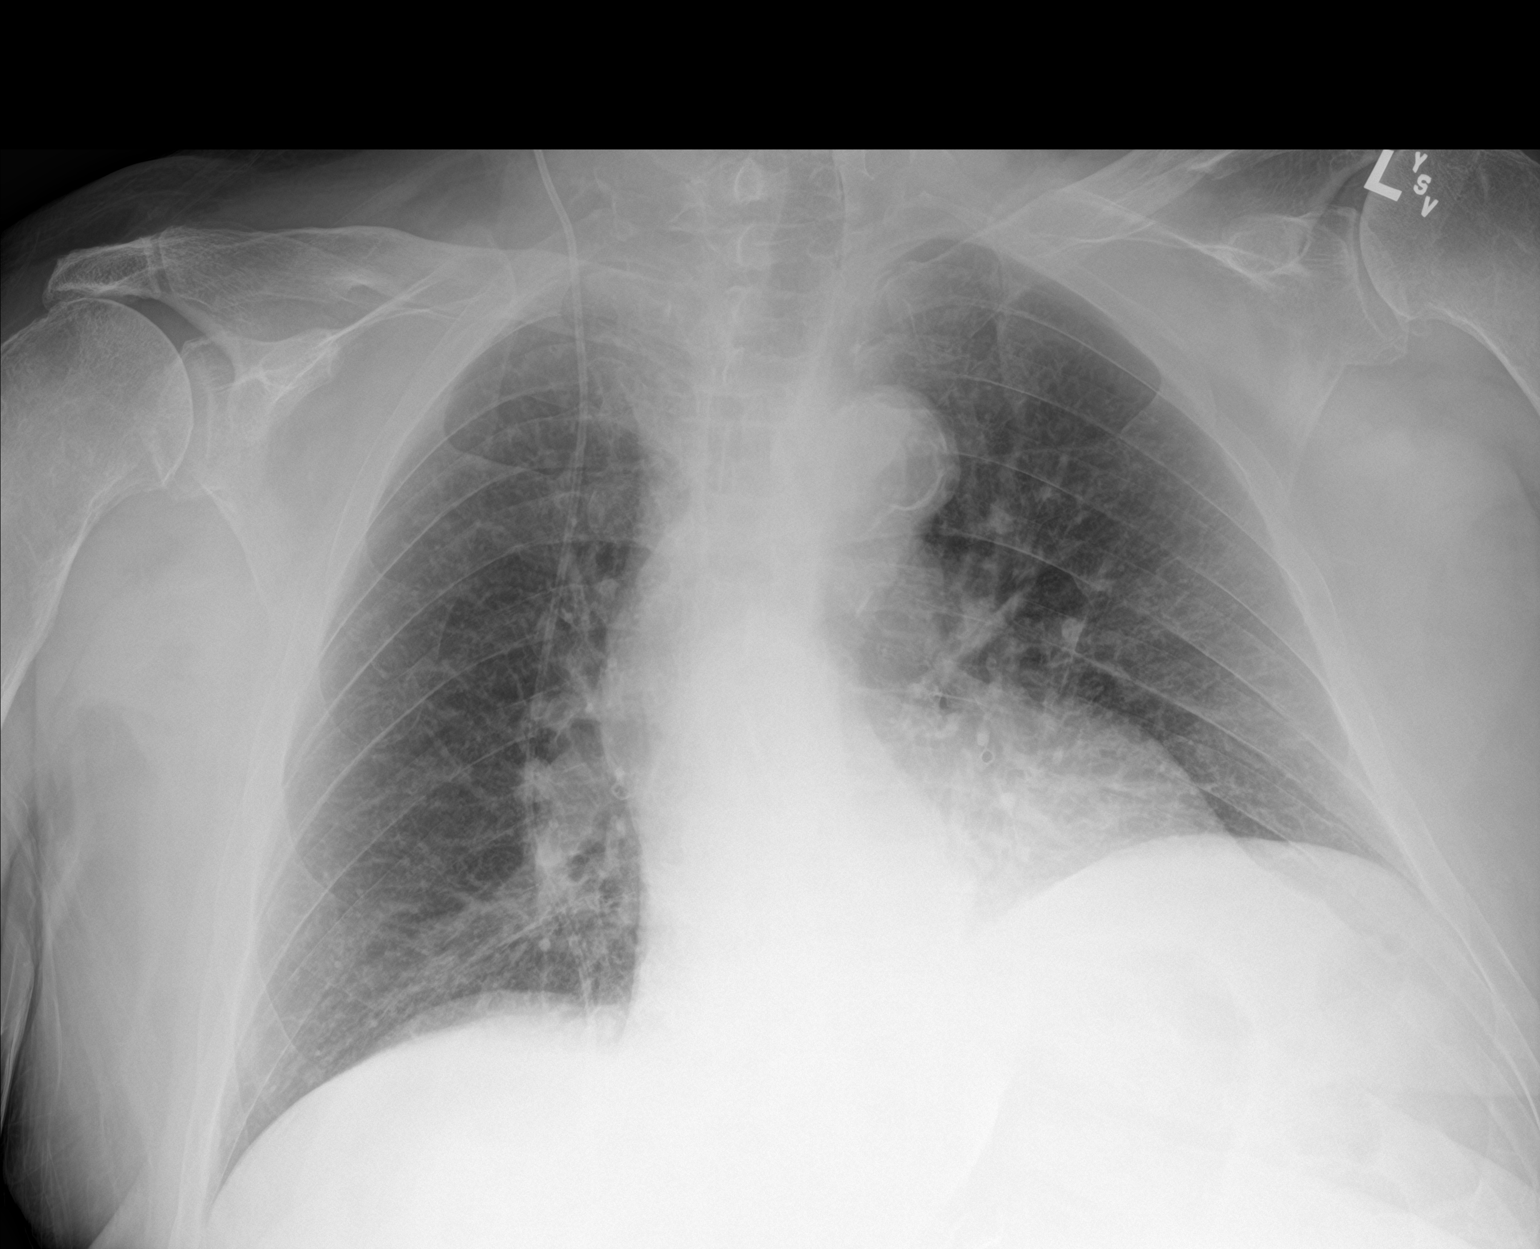

[1 of 1 positions shown; findings below may reference images not displayed]

FINDINGS: Upright AP view of the chest at 1166 hours. Shunt tubing continues
from the right neck through the right chest with no adverse
features.

Lung volumes and mediastinal contours are within normal limits;
tortuous thoracic aorta. Allowing for technique both lungs appear
clear. No acute osseous abnormality identified. Negative visible
bowel gas pattern.
IMPRESSION: 1. Right side VP shunt coursing from the neck through the chest with
no adverse features.
2.  No acute cardiopulmonary abnormality.

## 2022-08-12 IMAGING — CT CT HEAD W/O CM
4 series · 16 of 47 positions shown, 18 images · non-contrast
Comparison: Preoperative head CT 05/18/2020.

CLINICAL DATA: 83-year-old male postoperative day zero status post
right ventriculoperitoneal shunt placement for normal pressure
hydrocephalus.

EXAM:
CT HEAD WITHOUT CONTRAST
TECHNIQUE: Contiguous axial images were obtained from the base of the skull
through the vertex without intravenous contrast.

[Series 3: head wo · axial · 0.47mm/px · z∈[-118,+2]mm · 7 of 34 slices shown, 9 images]
[im 5/34  brain]
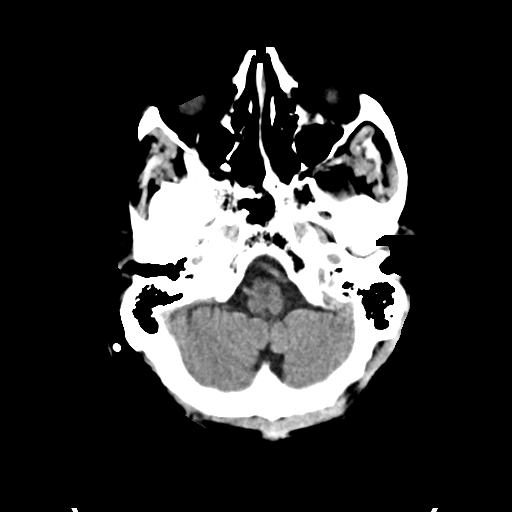
[im 5/34  bone]
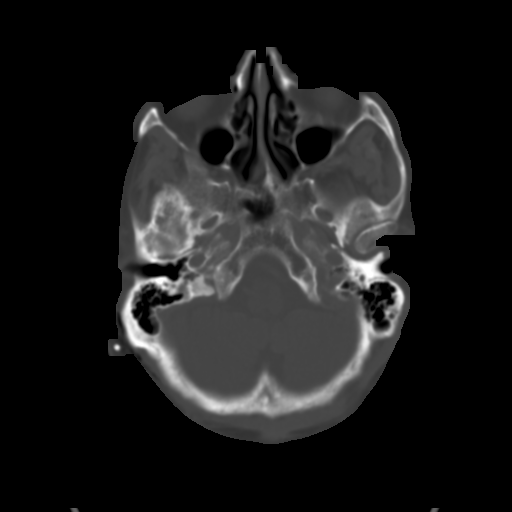
[im 9/34  brain]
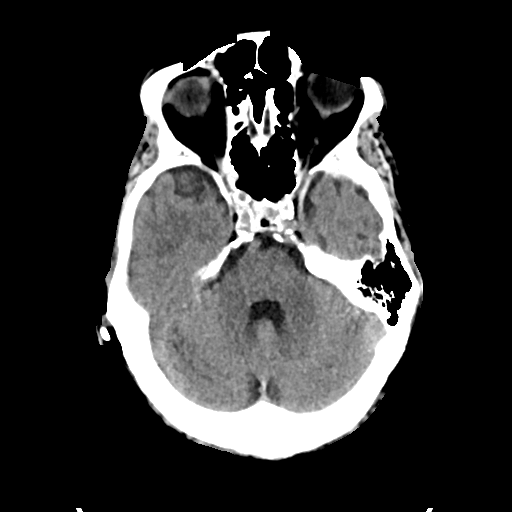
[im 13/34  brain]
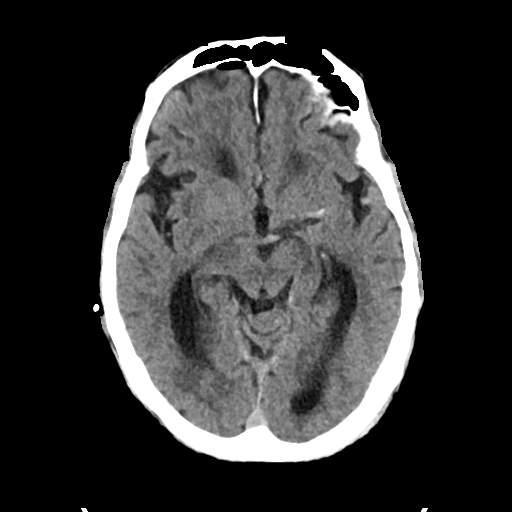
[im 17/34  brain]
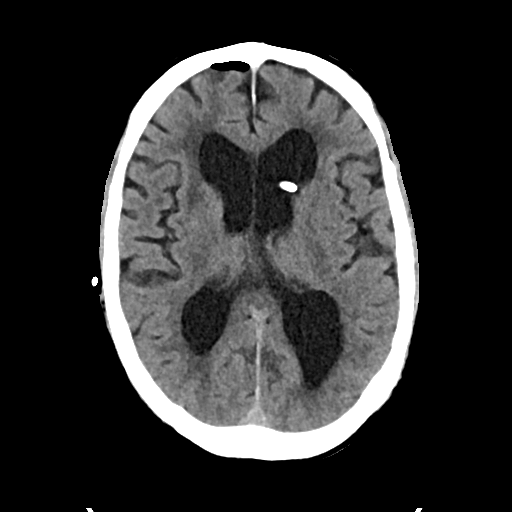
[im 21/34  brain]
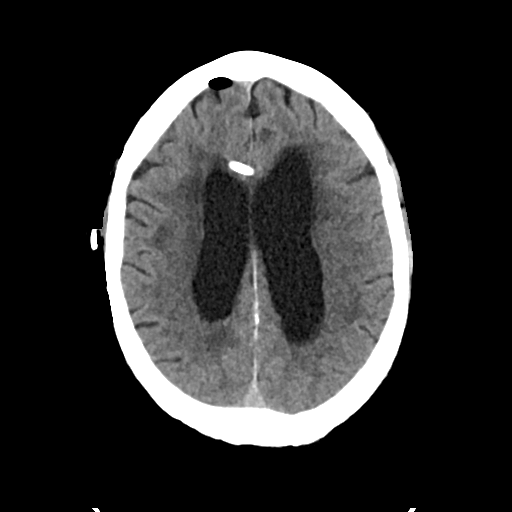
[im 21/34  bone]
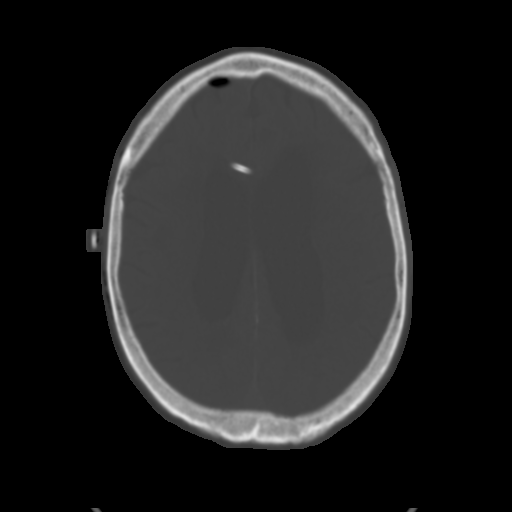
[im 25/34  brain]
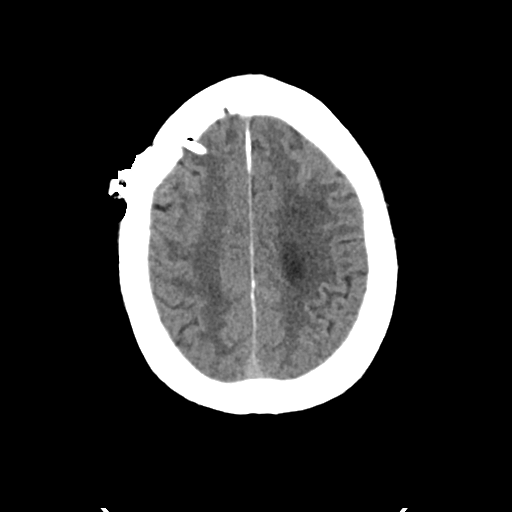
[im 29/34  brain]
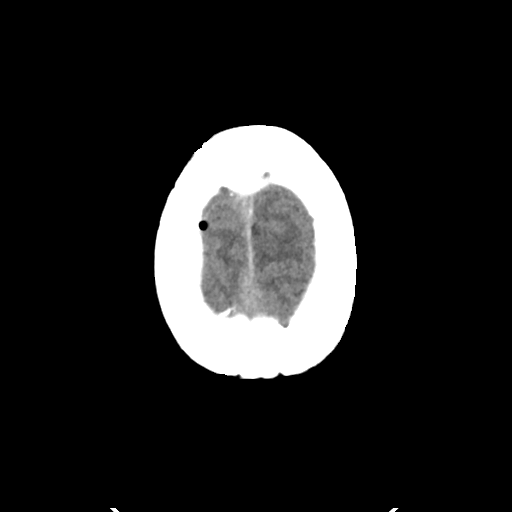

[Series 4: head bone · axial · 0.47mm/px · z∈[-122,-90]mm · 3 of 84 slices shown]
[im 9/84  bone]
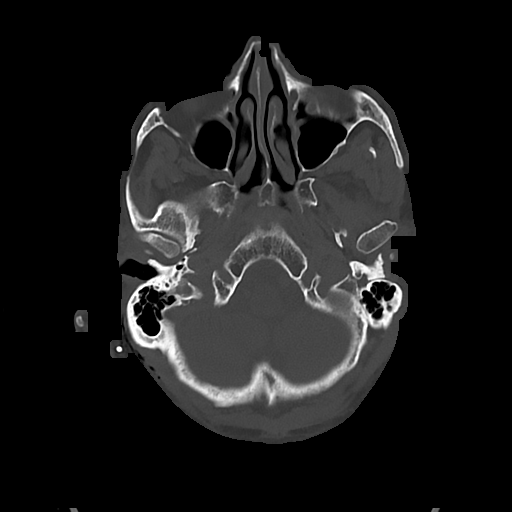
[im 17/84  bone]
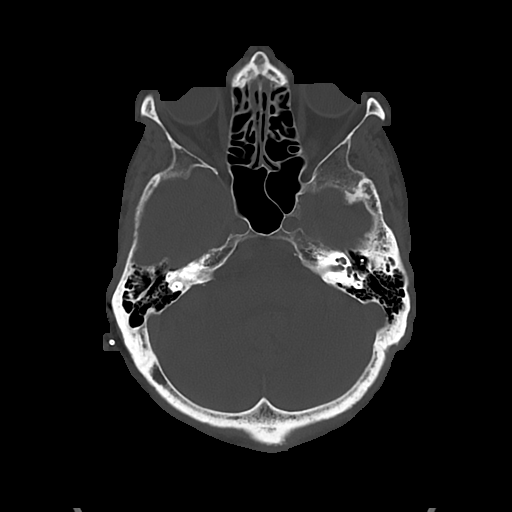
[im 25/84  bone]
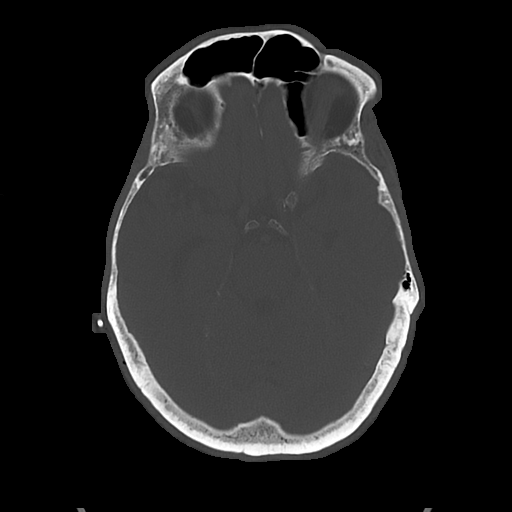

[Series 5: cor soft · coronal · 0.32mm/px · 3 of 73 slices shown]
[im 25/73  brain]
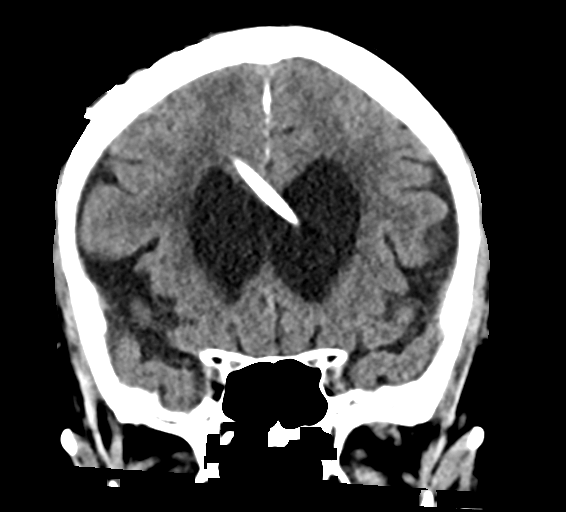
[im 33/73  brain]
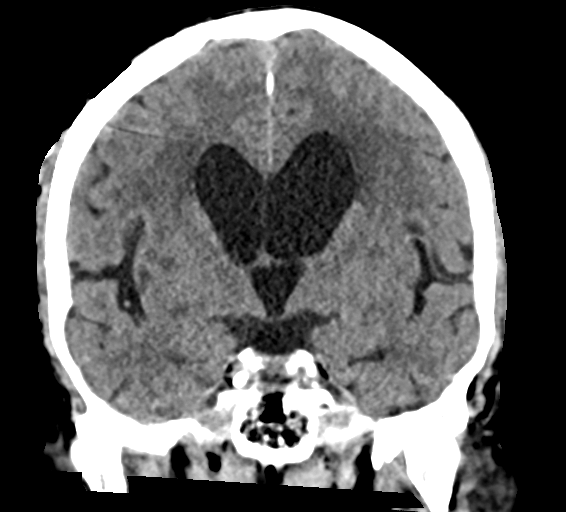
[im 41/73  brain]
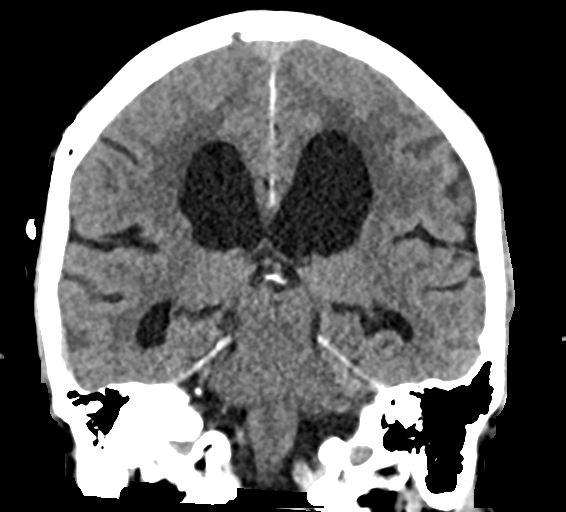

[Series 6: sag soft · sagittal · 0.33mm/px · 3 of 60 slices shown]
[im 20/60  brain]
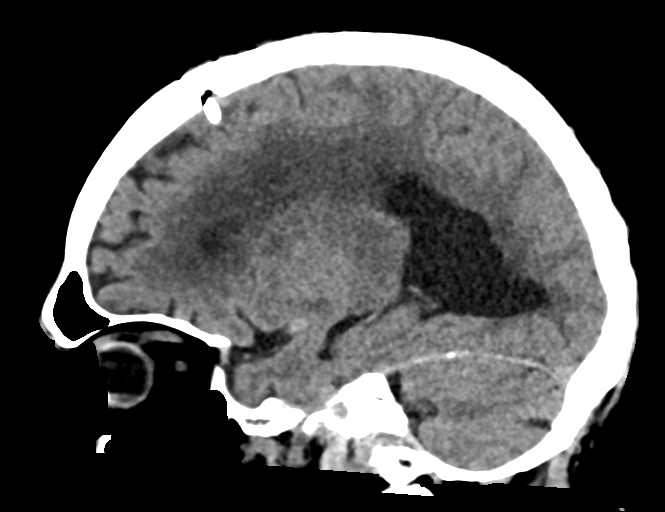
[im 30/60  brain]
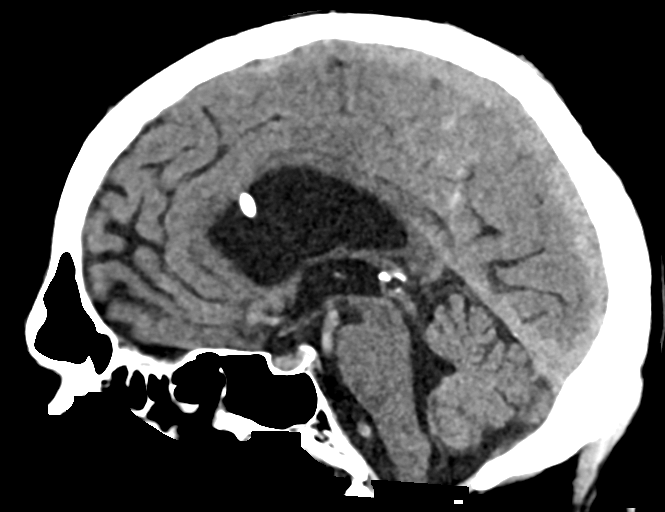
[im 40/60  brain]
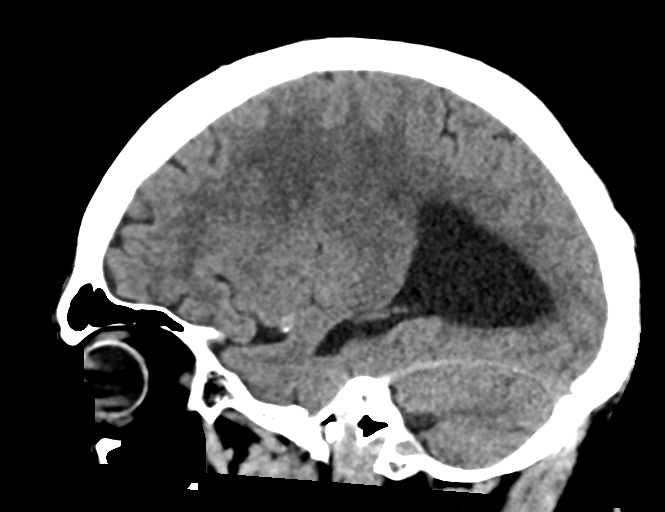

[16 of 47 positions shown; findings below may reference images not displayed]

FINDINGS: Brain: Trace postoperative pneumocephalus. Right superior frontal
approach shunt tubing tracks across midline and terminates at the
junction of the body and horn of the left lateral ventricle (series
5, image 26). Stable ventricle size and configuration.

No midline shift, ventriculomegaly, mass effect, evidence of mass
lesion, intracranial hemorrhage or evidence of cortically based
acute infarction. Stable gray-white matter differentiation
throughout the brain.

Vascular: Calcified atherosclerosis at the skull base.

Skull: Right vertex burr hole, otherwise stable, negative.

Sinuses/Orbits: Visualized paranasal sinuses and mastoids are stable
and well pneumatized.

Other: Recent postoperative changes to the scalp soft tissues with
trace gas and fluid along the course of the extracranial shunt
catheter. Right superior convexity reservoir. Visible shunt tubing
appears intact. No adverse features.

Stable, negative orbits soft tissues.
IMPRESSION: 1. New right superior frontal CSF shunt terminates in the left
lateral ventricle with no adverse features. Stable ventricle size
and configuration.
2. Otherwise stable non contrast CT appearance of the brain.

## 2022-08-20 DIAGNOSIS — Z01118 Encounter for examination of ears and hearing with other abnormal findings: Secondary | ICD-10-CM | POA: Diagnosis not present

## 2022-08-20 DIAGNOSIS — H903 Sensorineural hearing loss, bilateral: Secondary | ICD-10-CM | POA: Diagnosis not present

## 2022-09-03 DIAGNOSIS — H401132 Primary open-angle glaucoma, bilateral, moderate stage: Secondary | ICD-10-CM | POA: Diagnosis not present

## 2022-09-08 ENCOUNTER — Encounter (HOSPITAL_COMMUNITY): Payer: Self-pay | Admitting: Emergency Medicine

## 2022-09-08 ENCOUNTER — Other Ambulatory Visit: Payer: Self-pay

## 2022-09-08 ENCOUNTER — Emergency Department (HOSPITAL_COMMUNITY): Payer: Medicare PPO

## 2022-09-08 ENCOUNTER — Emergency Department (HOSPITAL_COMMUNITY)
Admission: EM | Admit: 2022-09-08 | Discharge: 2022-09-09 | Disposition: A | Discharge status: Home / Self Care | Payer: Medicare PPO | Attending: Emergency Medicine | Admitting: Emergency Medicine

## 2022-09-08 DIAGNOSIS — R112 Nausea with vomiting, unspecified: Secondary | ICD-10-CM | POA: Insufficient documentation

## 2022-09-08 DIAGNOSIS — N2889 Other specified disorders of kidney and ureter: Secondary | ICD-10-CM | POA: Diagnosis not present

## 2022-09-08 DIAGNOSIS — R188 Other ascites: Secondary | ICD-10-CM | POA: Diagnosis not present

## 2022-09-08 DIAGNOSIS — Z20822 Contact with and (suspected) exposure to covid-19: Secondary | ICD-10-CM | POA: Diagnosis not present

## 2022-09-08 DIAGNOSIS — Z7982 Long term (current) use of aspirin: Secondary | ICD-10-CM | POA: Insufficient documentation

## 2022-09-08 DIAGNOSIS — R197 Diarrhea, unspecified: Secondary | ICD-10-CM | POA: Diagnosis not present

## 2022-09-08 LAB — CBC WITH DIFFERENTIAL/PLATELET
Abs Immature Granulocytes: 0.04 10*3/uL (ref 0.00–0.07)
Basophils Absolute: 0.1 10*3/uL (ref 0.0–0.1)
Basophils Relative: 1 %
Eosinophils Absolute: 0.1 10*3/uL (ref 0.0–0.5)
Eosinophils Relative: 1 %
HCT: 44.1 % (ref 39.0–52.0)
Hemoglobin: 15.5 g/dL (ref 13.0–17.0)
Immature Granulocytes: 0 %
Lymphocytes Relative: 31 %
Lymphs Abs: 3.6 10*3/uL (ref 0.7–4.0)
MCH: 32.8 pg (ref 26.0–34.0)
MCHC: 35.1 g/dL (ref 30.0–36.0)
MCV: 93.2 fL (ref 80.0–100.0)
Monocytes Absolute: 1.6 10*3/uL — ABNORMAL HIGH (ref 0.1–1.0)
Monocytes Relative: 13 %
Neutro Abs: 6.5 10*3/uL (ref 1.7–7.7)
Neutrophils Relative %: 54 %
Platelets: 251 10*3/uL (ref 150–400)
RBC: 4.73 MIL/uL (ref 4.22–5.81)
RDW: 13.2 % (ref 11.5–15.5)
WBC Morphology: ABNORMAL
WBC: 11.9 10*3/uL — ABNORMAL HIGH (ref 4.0–10.5)
nRBC: 0 % (ref 0.0–0.2)

## 2022-09-08 LAB — URINALYSIS, ROUTINE W REFLEX MICROSCOPIC
Bilirubin Urine: NEGATIVE
Glucose, UA: NEGATIVE mg/dL
Hgb urine dipstick: NEGATIVE
Ketones, ur: NEGATIVE mg/dL
Leukocytes,Ua: NEGATIVE
Nitrite: NEGATIVE
Protein, ur: 100 mg/dL — AB
Specific Gravity, Urine: >1.046 — ABNORMAL HIGH (ref 1.005–1.030)
pH: 5 (ref 5.0–8.0)

## 2022-09-08 LAB — COMPREHENSIVE METABOLIC PANEL
ALT: 22 U/L (ref 0–44)
AST: 17 U/L (ref 15–41)
Albumin: 4 g/dL (ref 3.5–5.0)
Alkaline Phosphatase: 83 U/L (ref 38–126)
Anion gap: 8 (ref 5–15)
BUN: 16 mg/dL (ref 8–23)
CO2: 22 mmol/L (ref 22–32)
Calcium: 9.3 mg/dL (ref 8.9–10.3)
Chloride: 107 mmol/L (ref 98–111)
Creatinine, Ser: 0.93 mg/dL (ref 0.61–1.24)
GFR, Estimated: >60 mL/min (ref >60)
Glucose, Bld: 123 mg/dL — ABNORMAL HIGH (ref 70–99)
Potassium: 3.8 mmol/L (ref 3.5–5.1)
Sodium: 137 mmol/L (ref 135–145)
Total Bilirubin: 0.8 mg/dL (ref 0.3–1.2)
Total Protein: 7.2 g/dL (ref 6.5–8.1)

## 2022-09-08 LAB — RESP PANEL BY RT-PCR (RSV, FLU A&B, COVID)  RVPGX2
Influenza A by PCR: NEGATIVE
Influenza B by PCR: NEGATIVE
Resp Syncytial Virus by PCR: NEGATIVE
SARS Coronavirus 2 by RT PCR: NEGATIVE

## 2022-09-08 LAB — LIPASE, BLOOD: Lipase: 24 U/L (ref 11–51)

## 2022-09-08 MED ORDER — IOHEXOL 300 MG/ML  SOLN
100.0000 mL | Freq: Once | INTRAMUSCULAR | Status: AC | PRN
  Administered 2022-09-08: 100 mL via INTRAVENOUS

## 2022-09-08 MED ORDER — ONDANSETRON HCL 4 MG PO TABS: 4.0000 mg | ORAL_TABLET | Freq: Three times a day (TID) | ORAL | 0 refills | Status: DC | PRN

## 2022-09-08 MED ORDER — ONDANSETRON HCL 4 MG/2ML IJ SOLN
4.0000 mg | Freq: Once | INTRAMUSCULAR | Status: AC
  Administered 2022-09-08: 4 mg via INTRAVENOUS
  Filled 2022-09-08: qty 2

## 2022-09-08 MED ORDER — SODIUM CHLORIDE 0.9 % IV BOLUS
500.0000 mL | Freq: Once | INTRAVENOUS | Status: AC
  Administered 2022-09-08: 500 mL via INTRAVENOUS

## 2022-09-08 NOTE — ED Notes (Signed)
Patient transported to CT 

## 2022-09-08 NOTE — ED Notes (Signed)
Patient back from CT.

## 2022-09-08 NOTE — ED Provider Notes (Signed)
[facility] EMERGENCY DEPARTMENT Provider Note   CSN: [id number] Arrival date & time: 09/08/22  6295     History  Chief Complaint  Patient presents with   Diarrhea    Hollister Wessler is a 86 y.o. male.   Diarrhea Associated symptoms: abdominal pain and vomiting   Associated symptoms: no arthralgias, no chills, no fever, no headaches and no myalgias        Lashon Hillier is a 86 y.o. male GERD, prostate disease, with past medical history of who presents to the Emergency Department complaining of nausea, vomiting, diarrhea, and abdominal cramping.  Symptoms present for 5 days.  He describes sensation of bloating to his abdomen.  Stools have been brown in color and watery.  He denies any black or bloody stools or hematemesis.  Wife notes history of chronic constipation has to give stool softeners daily.  When diarrhea began she gave 2 doses of Imodium which temporarily stopped his diarrhea.  He is tolerating small amounts of food and liquids but typically has diarrhea or vomits shortly after eating.  Had a bowel movement upon ER arrival and now reports feeling better. He denies any cough, chest pain, shortness of breath, fever or chills.  No recent sick contacts.  No recent abdominal procedures.    Home Medications Prior to Admission medications   Medication Sig Start Date End Date Taking? Authorizing Provider  aspirin EC 81 MG tablet Take 1 tablet (81 mg total) by mouth at bedtime. Restart on 08/27/20 08/24/20   Judith Part, MD  docusate sodium (COLACE) 100 MG capsule Take 300 mg by mouth at bedtime.    [provider]  dorzolamide-timolol (COSOPT) 22.3-6.8 MG/ML ophthalmic solution Place 1 drop into both eyes 2 (two) times daily.  07/11/14   [provider]  doxazosin (CARDURA) 8 MG tablet Take 8 mg by mouth at bedtime.     [provider]  gabapentin (NEURONTIN) 300 MG capsule Take 100 mg by mouth at bedtime.    [provider]   HYDROcodone-acetaminophen (NORCO/VICODIN) 5-325 MG tablet Take 1 tablet by mouth every 4 (four) hours as needed for moderate pain. 08/24/20   Judith Part, MD  latanoprost (XALATAN) 0.005 % ophthalmic solution Place 1 drop into both eyes at bedtime.    [provider]  mirabegron ER (MYRBETRIQ) 25 MG TB24 tablet Take 25 mg by mouth at bedtime.     [provider]  pantoprazole (PROTONIX) 40 MG tablet Take 40 mg by mouth 2 (two) times daily before a meal.     [provider]  traMADol (ULTRAM) 50 MG tablet Take 1-2 tablets (50-100 mg total) by mouth every 6 (six) hours as needed for moderate pain or severe pain. 12/24/20   Manuella Ghazi, Pratik D, DO  valACYclovir (VALTREX) 1000 MG tablet Take 1,000 mg by mouth at bedtime.  07/11/20   [provider]  vitamin B-12 (CYANOCOBALAMIN) 1000 MCG tablet Take 1,000 mcg by mouth at bedtime.    [provider]      Allergies    Patient has no known allergies.    Review of Systems   Review of Systems  Constitutional:  Negative for appetite change, chills and fever.  Respiratory:  Negative for cough and shortness of breath.   Cardiovascular:  Negative for chest pain.  Gastrointestinal:  Positive for abdominal pain, diarrhea, nausea and vomiting. Negative for blood in stool.  Genitourinary:  Negative for decreased urine volume and difficulty urinating.  Musculoskeletal:  Negative  for arthralgias and myalgias.  Skin:  Negative for rash.  Neurological:  Negative for dizziness, weakness, numbness and headaches.    Physical Exam Updated Vital Signs BP 134/77 (BP Location: Right Arm)   Pulse 75   Temp 97.9 F (36.6 C) (Oral)   Resp 13   Ht '5\' 10"'$  (1.778 m)   Wt 98 kg   SpO2 99%   BMI 30.99 kg/m  Physical Exam Vitals and nursing note reviewed.  Constitutional:      General: He is not in acute distress.    Appearance: Normal appearance. He is not ill-appearing.  HENT:     Mouth/Throat:     Comments:  Mucous membranes slightly dry Eyes:     Extraocular Movements: Extraocular movements intact.     Conjunctiva/sclera: Conjunctivae normal.  Cardiovascular:     Rate and Rhythm: Normal rate and regular rhythm.     Pulses: Normal pulses.  Pulmonary:     Effort: Pulmonary effort is normal. No respiratory distress.  Abdominal:     General: There is no distension.     Palpations: Abdomen is soft. There is no mass.     Tenderness: There is no abdominal tenderness. There is no right CVA tenderness or left CVA tenderness.  Musculoskeletal:        General: Normal range of motion.     Right lower leg: No edema.     Left lower leg: No edema.  Skin:    General: Skin is warm.     Capillary Refill: Capillary refill takes less than 2 seconds.     Findings: No rash.  Neurological:     General: No focal deficit present.     Mental Status: He is alert.     Sensory: No sensory deficit.     Motor: No weakness.     ED Results / Procedures / Treatments   Labs (all labs ordered are listed, but only abnormal results are displayed) Labs Reviewed  CBC WITH DIFFERENTIAL/PLATELET - Abnormal; Notable for the following components:      Result Value   WBC 11.9 (*)    Monocytes Absolute 1.6 (*)    All other components within normal limits  COMPREHENSIVE METABOLIC PANEL - Abnormal; Notable for the following components:   Glucose, Bld 123 (*)    All other components within normal limits  URINALYSIS, ROUTINE W REFLEX MICROSCOPIC - Abnormal; Notable for the following components:   Specific Gravity, Urine >1.046 (*)    Protein, ur 100 (*)    Bacteria, UA RARE (*)    All other components within normal limits  RESP PANEL BY RT-PCR (RSV, FLU A&B, COVID)  RVPGX2  C DIFFICILE QUICK SCREEN W PCR REFLEX    LIPASE, BLOOD    EKG None  Radiology CT ABDOMEN PELVIS W CONTRAST  Result Date: 09/08/2022 CLINICAL DATA:  Nausea, vomiting common diarrhea for 5 days. EXAM: CT ABDOMEN AND PELVIS WITH CONTRAST  TECHNIQUE: Multidetector CT imaging of the abdomen and pelvis was performed using the standard protocol following bolus administration of intravenous contrast. RADIATION DOSE REDUCTION: This exam was performed according to the departmental dose-optimization program which includes automated exposure control, adjustment of the mA and/or kV according to patient size and/or use of iterative reconstruction technique. CONTRAST:  151m OMNIPAQUE IOHEXOL 300 MG/ML  SOLN COMPARISON:  CT abdomen/pelvis 12/18/2020 FINDINGS: Lower chest: There are small left and trace right pleural effusions with adjacent atelectasis. Coronary artery calcifications are noted. The imaged heart is otherwise unremarkable. Hepatobiliary: Scattered  small hypodense lesions in the liver likely reflect benign cysts requiring no specific imaging follow-up. The liver is otherwise unremarkable. The gallbladder is surgically absent. There is no biliary ductal dilatation. Pancreas: Unremarkable. Spleen: Unremarkable. Adrenals/Urinary Tract: There is a 1.3 cm right adrenal nodule which is unchanged going back to 2020 and is most consistent with a benign adenoma requiring no specific imaging follow-up. The left adrenal is unremarkable. Tiny hypodense lesions in the kidneys likely reflect benign cysts requiring no specific imaging follow-up. There are no suspicious renal lesions. There is no stone in either kidney or along the course of either ureter. There is symmetric excretion of contrast into the collecting systems on the delayed images. There is no hydronephrosis or hydroureter. The bladder is unremarkable. Stomach/Bowel: There is a moderate-sized hiatal hernia, unchanged. The stomach is otherwise unremarkable. There is no evidence of bowel obstruction. There are scattered colonic diverticuli without evidence of acute diverticulitis. There is fluid in the colon which can be seen with diarrheal illness. There is also fluid throughout the small bowel with  mild mucosal hyperenhancement. Vascular/Lymphatic: There is scattered calcified plaque in the nonaneurysmal abdominal aorta. The major branch vessels are patent. The main portal and splenic veins are patent. There is no abdominal or pelvic lymphadenopathy. Reproductive: Posttreatment changes are noted in the prostate. Other: There is trace free fluid in the left paracolic gutter. There is no free intraperitoneal air. Shunt tubing is noted in the anterior chest and abdominal wall terminating in the right lower abdomen, unchanged. Musculoskeletal: There is levocurvature of the lumbar spine with multilevel degenerative change. There is no acute osseous abnormality or suspicious osseous lesion. IMPRESSION: 1. Fluid throughout the small bowel with mild mucosal hyperenhancement may reflect mild nonspecific enterocolitis. Fluid in the large bowel can be seen in the setting of diarrheal illness. No evidence of bowel obstruction. 2. Small left and trace right pleural effusions with adjacent atelectasis. 3. Moderate-sized hiatal hernia. 4. Scattered colonic diverticuli without evidence of acute diverticulitis. 5. Right adrenal nodule has been present since 2020 and is most in keeping benign adenoma requiring no specific imaging follow-up. Electronically Signed   By: Valetta Mole M.D.   On: 09/08/2022 11:41    Procedures Procedures    Medications Ordered in ED Medications  sodium chloride 0.9 % bolus 500 mL (has no administration in time range)  ondansetron (ZOFRAN) injection 4 mg (has no administration in time range)    ED Course/ Medical Decision Making/ A&P                           Medical Decision Making Patient here accompanied by spouse, comes from home with complaints of nausea, abdominal pain, vomiting and diarrhea.  Symptoms present for 5 days.  Describes watery brown stools with vomiting or diarrhea typically after eating solid foods.  No fever or chills.  Recent abdominal procedures.    On my  exam, mucous membranes are slightly dry.  Patient well-appearing nontoxic.  Vital signs are reassuring.  He has a soft nontender abdomen on my exam  Differential diagnosis would include but not limited to acute intra-abdominal process, viral syndrome, UTI.  Doubt acute GI bleed given lack of bloody or black stool.  He has diarrhea without recent abdominal procedure, SBO felt less likely.  He will need labs and CT abdomen and pelvis.  Amount and/or Complexity of Data Reviewed Labs: ordered.    Details: Labs interpreted by me, no significant leukocytosis, hemoglobin  unremarkable.  Respiratory panel negative for COVID flu and influenza, urinalysis without evidence of infection.  Lipase unremarkable, chemistries without significant derangement. Radiology: ordered.    Details: CT abdomen and pelvis was ordered for further evaluation of patient's symptoms, CT findings show fluid throughout the small bowel which may represent nonspecific enterocolitis no evidence of bowel obstruction diverticuli without evidence of acute diverticulitis. Discussion of management or test interpretation with external provider(s): On recheck, patient resting comfortably.  Denies any abdominal pain at this time.  He has not had any further vomiting or diarrhea since ER arrival. Tolerating fluids  Patient's spouse who is at bedside is additional information, states patient was recently treated with 2 different antibiotics for URI approximately 1 month ago.  Last completed 10-day course of doxycycline 2 weeks ago.  C. difficile PCR was ordered but patient unable to provide sample.  Patient and spouse who is at bedside requesting discharge home, prefers to follow-up with PCP regarding C. difficile testing.  Strict return precautions were discussed.    Risk Prescription drug management.           Final Clinical Impression(s) / ED Diagnoses Final diagnoses:  Nausea vomiting and diarrhea    Rx / DC Orders ED  Discharge Orders     None         Bufford Lope 09/08/22 1727    Godfrey Pick, MD 09/09/22 2036

## 2022-09-08 NOTE — ED Triage Notes (Signed)
Patient c/o nausea, vomiting, and diarrhea x5 days. Per woife patient started with nausea and bloating, followed by diarrhea on Dec 20th. Patient given imodium on 21st and 22nd. Constant feeling of having to have BM. Patient has been on light diet since symptoms started. Patient stopped taking colace and tramadol. NPO since 6pm yesterday.  Patient stools and abd cramping worse at night. Denies any fevers.

## 2022-09-08 NOTE — Discharge Instructions (Addendum)
Frequent small sips of clear fluids, bland diet as tolerated.  You may try the brat or bratty diet (bananas rice applesauce toast tea and yogurt) to help naturally with his diarrhea.  If needed, you may give Imodium as directed.  As discussed, please follow-up with his primary care provider on Tuesday for recheck if his symptoms or not improving as he may need testing for C. difficile infection.  You may also return to the emergency department for any new or worsening symptoms.

## 2022-09-17 ENCOUNTER — Telehealth: Payer: Self-pay

## 2022-09-17 NOTE — Telephone Encounter (Signed)
        Patient  visited [place] on 12/25     Telephone encounter attempt :  1st  A HIPAA compliant voice message was left requesting a return call.  Instructed patient to call back .    [place], Care Management  [phone] [address], [place], [place] [address] Phone: [phone] Email: [email or web address]

## 2022-09-18 ENCOUNTER — Telehealth: Payer: Self-pay

## 2022-09-18 NOTE — Telephone Encounter (Signed)
        Patient  visited [place] on 12/25     Telephone encounter attempt :  2nd  A HIPAA compliant voice message was left requesting a return call.  Instructed patient to call back .    [place], Care Management  [phone] [address], [place], [place] [address] Phone: [phone] Email: [email or web address]

## 2022-10-02 DIAGNOSIS — H6123 Impacted cerumen, bilateral: Secondary | ICD-10-CM | POA: Diagnosis not present

## 2022-11-07 ENCOUNTER — Emergency Department (HOSPITAL_COMMUNITY): Payer: Medicare PPO

## 2022-11-07 ENCOUNTER — Emergency Department (HOSPITAL_COMMUNITY)
Admission: EM | Admit: 2022-11-07 | Discharge: 2022-11-07 | Disposition: A | Discharge status: Home / Self Care | Payer: Medicare PPO | Attending: Emergency Medicine | Admitting: Emergency Medicine

## 2022-11-07 DIAGNOSIS — R058 Other specified cough: Secondary | ICD-10-CM | POA: Insufficient documentation

## 2022-11-07 DIAGNOSIS — Z1152 Encounter for screening for COVID-19: Secondary | ICD-10-CM | POA: Insufficient documentation

## 2022-11-07 DIAGNOSIS — R519 Headache, unspecified: Secondary | ICD-10-CM | POA: Diagnosis present

## 2022-11-07 DIAGNOSIS — S4991XA Unspecified injury of right shoulder and upper arm, initial encounter: Secondary | ICD-10-CM | POA: Insufficient documentation

## 2022-11-07 DIAGNOSIS — Z7982 Long term (current) use of aspirin: Secondary | ICD-10-CM | POA: Insufficient documentation

## 2022-11-07 DIAGNOSIS — F05 Delirium due to known physiological condition: Secondary | ICD-10-CM

## 2022-11-07 DIAGNOSIS — D72829 Elevated white blood cell count, unspecified: Secondary | ICD-10-CM | POA: Insufficient documentation

## 2022-11-07 DIAGNOSIS — W19XXXA Unspecified fall, initial encounter: Secondary | ICD-10-CM | POA: Insufficient documentation

## 2022-11-07 DIAGNOSIS — J329 Chronic sinusitis, unspecified: Secondary | ICD-10-CM

## 2022-11-07 DIAGNOSIS — J189 Pneumonia, unspecified organism: Secondary | ICD-10-CM | POA: Diagnosis not present

## 2022-11-07 DIAGNOSIS — R410 Disorientation, unspecified: Secondary | ICD-10-CM | POA: Diagnosis not present

## 2022-11-07 LAB — CBC
HCT: 40 % (ref 39.0–52.0)
Hemoglobin: 14 g/dL (ref 13.0–17.0)
MCH: 32 pg (ref 26.0–34.0)
MCHC: 35 g/dL (ref 30.0–36.0)
MCV: 91.5 fL (ref 80.0–100.0)
Platelets: 280 10*3/uL (ref 150–400)
RBC: 4.37 MIL/uL (ref 4.22–5.81)
RDW: 12.3 % (ref 11.5–15.5)
WBC: 13.2 10*3/uL — ABNORMAL HIGH (ref 4.0–10.5)
nRBC: 0 % (ref 0.0–0.2)

## 2022-11-07 LAB — URINALYSIS, ROUTINE W REFLEX MICROSCOPIC
Glucose, UA: NEGATIVE mg/dL
Hgb urine dipstick: NEGATIVE
Ketones, ur: NEGATIVE mg/dL
Leukocytes,Ua: NEGATIVE
Nitrite: NEGATIVE
Protein, ur: NEGATIVE mg/dL
Specific Gravity, Urine: 1.023 (ref 1.005–1.030)
pH: 5 (ref 5.0–8.0)

## 2022-11-07 LAB — RESP PANEL BY RT-PCR (RSV, FLU A&B, COVID)  RVPGX2
Influenza A by PCR: NEGATIVE
Influenza B by PCR: NEGATIVE
Resp Syncytial Virus by PCR: NEGATIVE
SARS Coronavirus 2 by RT PCR: NEGATIVE

## 2022-11-07 LAB — COMPREHENSIVE METABOLIC PANEL
ALT: 10 U/L (ref 0–44)
AST: 14 U/L — ABNORMAL LOW (ref 15–41)
Albumin: 3.3 g/dL — ABNORMAL LOW (ref 3.5–5.0)
Alkaline Phosphatase: 81 U/L (ref 38–126)
Anion gap: 7 (ref 5–15)
BUN: 12 mg/dL (ref 8–23)
CO2: 23 mmol/L (ref 22–32)
Calcium: 9.1 mg/dL (ref 8.9–10.3)
Chloride: 102 mmol/L (ref 98–111)
Creatinine, Ser: 0.94 mg/dL (ref 0.61–1.24)
GFR, Estimated: >60 mL/min (ref >60)
Glucose, Bld: 104 mg/dL — ABNORMAL HIGH (ref 70–99)
Potassium: 4.2 mmol/L (ref 3.5–5.1)
Sodium: 132 mmol/L — ABNORMAL LOW (ref 135–145)
Total Bilirubin: 0.6 mg/dL (ref 0.3–1.2)
Total Protein: 6.4 g/dL — ABNORMAL LOW (ref 6.5–8.1)

## 2022-11-07 LAB — CBG MONITORING, ED: Glucose-Capillary: 111 mg/dL — ABNORMAL HIGH (ref 70–99)

## 2022-11-07 MED ORDER — DOXYCYCLINE HYCLATE 100 MG PO CAPS: 100.0000 mg | ORAL_CAPSULE | Freq: Two times a day (BID) | ORAL | 0 refills | Status: AC

## 2022-11-07 MED ORDER — QUETIAPINE FUMARATE 50 MG PO TABS: 50.0000 mg | ORAL_TABLET | Freq: Every day | ORAL | 1 refills | Status: DC

## 2022-11-07 MED ORDER — HYDRALAZINE HCL 25 MG PO TABS
50.0000 mg | ORAL_TABLET | Freq: Once | ORAL | Status: AC
  Administered 2022-11-07: 50 mg via ORAL
  Filled 2022-11-07: qty 2

## 2022-11-07 MED ORDER — QUETIAPINE FUMARATE 25 MG PO TABS
50.0000 mg | ORAL_TABLET | Freq: Every day | ORAL | Status: DC
  Administered 2022-11-07: 50 mg via ORAL
  Filled 2022-11-07: qty 2

## 2022-11-07 MED ORDER — DOXYCYCLINE HYCLATE 100 MG PO TABS
100.0000 mg | ORAL_TABLET | Freq: Once | ORAL | Status: AC
  Administered 2022-11-07: 100 mg via ORAL
  Filled 2022-11-07: qty 1

## 2022-11-07 MED ORDER — HYDRALAZINE HCL 20 MG/ML IJ SOLN
10.0000 mg | Freq: Once | INTRAMUSCULAR | Status: DC
  Filled 2022-11-07: qty 1

## 2022-11-07 NOTE — ED Provider Notes (Signed)
[facility] Provider Note   CSN: [id number] Arrival date & time: 11/07/22  1101     History  Chief Complaint  Patient presents with   URI   Fall    Jeffrey Wheeler is a 87 y.o. male presenting to the ED with multiple complaints.  His wife who is a former retired Marine scientist provides supplemental history.  She reports that for about 2 weeks he has noted the patient's been having confusion at night, with visual hallucinations, seeing shapes outside the window, also confusion and disorientation in the middle of the night.  The patient takes tramadol chronically for back pain and takes 1 to 2 tablets a day for many years.  He takes 20 mg of melatonin at night also for many years.  He has no new medications or narcotic prescriptions.  He has had a cough for several days which his wife has as well, and suspected this was a viral illness.  The patient complained of mild intermittent headaches but not persistent headache.  No dysuria or history of UTIs.  He is otherwise behaving normally.  He appears behaving normally in the daytime.  His wife is unaware of any diagnosis of Alzheimer's or dementia but is trying to schedule an appointment with a neurologist for this type of assessment.  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  His wife also reports his blood pressure has been higher than normal.  Typically the systolic pressure is around [id number] systolic  HPI     Home Medications Prior to Admission medications   Medication Sig Start Date End Date Taking? Authorizing Provider  doxycycline (VIBRAMYCIN) 100 MG capsule Take 1 capsule (100 mg total) by mouth 2 (two) times daily for 7 days. 11/07/22 11/14/22 Yes Amogh Komatsu, Carola Rhine, MD  QUEtiapine (SEROQUEL) 50 MG tablet Take 1 tablet (50 mg total) by mouth at bedtime. 11/07/22 12/07/22 Yes Wyvonnia Dusky, MD   aspirin EC 81 MG tablet Take 1 tablet (81 mg total) by mouth at bedtime. Restart on 08/27/20 08/24/20   Judith Part, MD  docusate sodium (COLACE) 100 MG capsule Take 300 mg by mouth at bedtime.    [provider]  dorzolamide-timolol (COSOPT) 22.3-6.8 MG/ML ophthalmic solution Place 1 drop into both eyes 2 (two) times daily.  07/11/14   [provider]  doxazosin (CARDURA) 8 MG tablet Take 8 mg by mouth at bedtime.     [provider]  gabapentin (NEURONTIN) 300 MG capsule Take 100 mg by mouth at bedtime.    [provider]  HYDROcodone-acetaminophen (NORCO/VICODIN) 5-325 MG tablet Take 1 tablet by mouth every 4 (four) hours as needed for moderate pain. 08/24/20   Judith Part, MD  latanoprost (XALATAN) 0.005 % ophthalmic solution Place 1 drop into both eyes at bedtime.    [provider]  mirabegron ER (MYRBETRIQ) 25 MG TB24 tablet Take 25 mg by mouth at bedtime.     [provider]  ondansetron (ZOFRAN) 4 MG tablet Take 1 tablet (4 mg total) by mouth every 8 (eight) hours as needed for nausea or vomiting. 09/08/22   Triplett, Tammy, PA-C  pantoprazole (PROTONIX) 40 MG tablet Take 40 mg by mouth 2 (two) times daily before a meal.     [provider]  traMADol (ULTRAM) 50 MG tablet Take 1-2 tablets (50-100 mg  total) by mouth every 6 (six) hours as needed for moderate pain or severe pain. 12/24/20   Manuella Ghazi, Pratik D, DO  valACYclovir (VALTREX) 1000 MG tablet Take 1,000 mg by mouth at bedtime.  07/11/20   [provider]  vitamin B-12 (CYANOCOBALAMIN) 1000 MCG tablet Take 1,000 mcg by mouth at bedtime.    [provider]      Allergies    Patient has no known allergies.    Review of Systems   Review of Systems  Physical Exam Updated Vital Signs BP (!) 173/88   Pulse 84   Temp 97.6 F (36.4 C) (Oral)   Resp 18   Ht 5' 10"$  (1.778 m)   Wt 98 kg   SpO2 98%   BMI 31.00 kg/m  Physical  Exam Constitutional:      General: He is not in acute distress. HENT:     Head: Normocephalic and atraumatic.  Eyes:     Conjunctiva/sclera: Conjunctivae normal.     Pupils: Pupils are equal, round, and reactive to light.  Cardiovascular:     Rate and Rhythm: Normal rate and regular rhythm.  Pulmonary:     Effort: Pulmonary effort is normal. No respiratory distress.  Abdominal:     General: There is no distension.     Tenderness: There is no abdominal tenderness.  Skin:    General: Skin is warm and dry.  Neurological:     General: No focal deficit present.     Mental Status: He is alert and oriented to person, place, and time. Mental status is at baseline.     Cranial Nerves: No cranial nerve deficit.     Sensory: No sensory deficit.     Motor: No weakness.     ED Results / Procedures / Treatments   Labs (all labs ordered are listed, but only abnormal results are displayed) Labs Reviewed  COMPREHENSIVE METABOLIC PANEL - Abnormal; Notable for the following components:      Result Value   Sodium 132 (*)    Glucose, Bld 104 (*)    Total Protein 6.4 (*)    Albumin 3.3 (*)    AST 14 (*)    All other components within normal limits  CBC - Abnormal; Notable for the following components:   WBC 13.2 (*)    All other components within normal limits  URINALYSIS, ROUTINE W REFLEX MICROSCOPIC - Abnormal; Notable for the following components:   Bilirubin Urine SMALL (*)    All other components within normal limits  CBG MONITORING, ED - Abnormal; Notable for the following components:   Glucose-Capillary 111 (*)    All other components within normal limits  RESP PANEL BY RT-PCR (RSV, FLU A&B, COVID)  [id number]    EKG None  Radiology DG Chest 2 View  Result Date: 11/07/2022 CLINICAL DATA:  Pneumonia EXAM: CHEST - 2 VIEW COMPARISON:  CXR 08/23/20 FINDINGS: No pleural effusion. No pneumothorax. Normal cardiac and mediastinal contours. No focal airspace opacity. Similar angulated  appearance of the lower left ninth rib could represent a subacute chronic fracture. Visualized upper abdomen unremarkable. Shunt catheter tubing courses along right hemithorax and is unchanged in appearance prior exam. Vertebral body heights are maintained. IMPRESSION: No focal airspace opacity. Electronically Signed   By: Marin Roberts M.D.   On: 11/07/2022 18:40   CT Head Wo Contrast  Result Date: 11/07/2022 CLINICAL DATA:  Stroke suspected. Increased confusion and difficulty ambulating. Has a shunt in place. EXAM: CT HEAD WITHOUT CONTRAST  TECHNIQUE: Contiguous axial images were obtained from the base of the skull through the vertex without intravenous contrast. RADIATION DOSE REDUCTION: This exam was performed according to the departmental dose-optimization program which includes automated exposure control, adjustment of the mA and/or kV according to patient size and/or use of iterative reconstruction technique. COMPARISON:  12/10/21 CT Head FINDINGS: Brain: No evidence of acute infarction, hemorrhage, extra-axial collection or mass lesion/mass effect. There is sequela of severe chronic microvascular ischemic change. Right frontal approach ventriculostomy catheter with the tip in the left frontal horn. Size of the ventricular system is unchanged prior exam. No evidence of periventricular hyperintensity suggest transependymal flow of CSF. Vascular: Atherosclerotic calcifications of the carotid siphons bilaterally. Atherosclerotic plaque is also seen in the distal M1, from prior exam. Skull: Normal. Negative for fracture or focal lesion. Right frontal burr hole for shunt catheter placement. Sinuses/Orbits: Pansinus mucosal thickening, new compared to prior exam. Bilateral lens replacement. Orbits otherwise unremarkable. No mastoid or middle ear effusion. Other: None IMPRESSION: 1. No hemorrhage or CT evidence of an acute infarct. 2. Right frontal approach ventriculostomy catheter with the tip in the left frontal  horn. Size of the ventricular system is unchanged compared to prior exam. 3. Pansinus mucosal thickening, new compared to prior exam. Electronically Signed   By: Marin Roberts M.D.   On: 11/07/2022 13:05    Procedures Procedures    Medications Ordered in ED Medications  QUEtiapine (SEROQUEL) tablet 50 mg (has no administration in time range)  hydrALAZINE (APRESOLINE) tablet 50 mg (50 mg Oral Given 11/07/22 1841)  doxycycline (VIBRA-TABS) tablet 100 mg (100 mg Oral Given 11/07/22 1944)    ED Course/ Medical Decision Making/ A&P                             Medical Decision Making Amount and/or Complexity of Data Reviewed Labs: ordered. Radiology: ordered.  Risk Prescription drug management.   This patient presents to the Emergency Department with complaint of altered mental status.  This involves an extensive number of treatment options, and is a complaint that carries with it a high risk of complications and morbidity.  The differential diagnosis includes hypoglycemia vs metabolic encephalopathy vs infection (including cystitis) vs ICH vs stroke vs polypharmacy vs other  His presentation is most consistent with sundowning.  Symptoms occurring most excessively at night.  There may be an infectious etiology.  He has no visual deficits, no persistent headache.  I have a low suspicion for increased intracranial pressure, pseudotumor, CVA, meningitis.  I ordered, reviewed, and interpreted labs, including mild leukocytosis.  Glucose within normal limits.  No emergent findings on hemoglobin or CMP.  No acute anemia.  UA with no signs of infection.  COVID and flu testing is negative I ordered medication hydralazine for blood pressure I ordered imaging studies which included CT of the head, x-ray of the chest I independently visualized and interpreted imaging which showed shunt in appropriate position, no ventricular enlargement; pansunsitis; no acute infiltrate on chest x-ray and the monitor  tracing which showed normal sinus rhythm Additional history was obtained from wife at bedside  I have a low suspicion for ACS, PE, sepsis.   Patient's presentation is most consistent with sundowning.   He will continue to minimize medications at home, including over-the-counter medicines.  We can try Seroquel at night which may help.  His wife reports that the patient does take frequent naps throughout the day and I recommended  that he try to regulate his sleep cycle better, to help him maintain better sleep at night.  He tends to fall asleep quickly at 10 PM and then wake up around 1 or 2 AM where the confusion kicks in.  They can also follow-up with a neurologist for this issue.  They verbalized understanding and agreement with the plan  After the interventions stated above, I reevaluated the patient and found he remained stable, mentating well, no confusion         Final Clinical Impression(s) / ED Diagnoses Final diagnoses:  Sundowning  Sinusitis, unspecified chronicity, unspecified location    Rx / DC Orders ED Discharge Orders          Ordered    QUEtiapine (SEROQUEL) 50 MG tablet  Daily at bedtime        11/07/22 2028    doxycycline (VIBRAMYCIN) 100 MG capsule  2 times daily        11/07/22 2028              Wyvonnia Dusky, MD 11/07/22 2030

## 2022-11-07 NOTE — ED Triage Notes (Addendum)
Pt c/o cough and malaise for two weeks. Pt also c/o fall yesterday and injuring his R arm. Denies head injury/LOC. No blood thinners per pt. A+Ox4 on arrival.   Pt's wife arrives later at bedside stating that since Saturday pt has been having increasing confusion and disorientation every evening, resolving by morning. She reports he has hx of VP shunt and has had ICP pressure checked twice in the last two years which was normal.

## 2022-11-07 NOTE — ED Provider Triage Note (Signed)
Emergency Medicine Provider Triage Evaluation Note  Jeffrey Wheeler , a 87 y.o. male  was evaluated in triage.  Patient presents with his wife.  His wife provides the majority of the history.  She reports that he has a history of hydrocephalus for which he had a VP shunt placed a couple years ago.  Has had it checked twice since then without any abnormalities.  She reports that since Saturday he has been having increased confusion and difficulty ambulating.  She says that he is shuffling.  This was what was happening prior to the shunt placement.  She also says that he is having some memory concerns such as dialing the wrong phone numbers, being confused about his surroundings, etc.  She does report that yesterday he had a fall secondary to the shuffling.  It was witnessed that he did not hit his head.  Needed help getting up  Review of Systems  Positive:  Negative:   Physical Exam  BP (!) 150/82 (BP Location: Right Arm)   Pulse 76   Temp (!) 97.4 F (36.3 C)   Resp 18   SpO2 93%  Gen:   Awake, no distress   Resp:  Normal effort  MSK:   Moves extremities without difficulty  Other:  Moving all extremities.  Normal strength in bilateral upper and lower extremities.  Fully alert and oriented Medical Decision Making  Medically screening exam initiated at 11:40 AM.  Appropriate orders placed.  Tanmay Nigrelli was informed that the remainder of the evaluation will be completed by another provider, this initial triage assessment does not replace that evaluation, and the importance of remaining in the ED until their evaluation is complete.     Rhae Hammock, PA-C 11/07/22 1142

## 2022-11-11 DIAGNOSIS — R4182 Altered mental status, unspecified: Secondary | ICD-10-CM | POA: Diagnosis not present

## 2022-11-11 DIAGNOSIS — R030 Elevated blood-pressure reading, without diagnosis of hypertension: Secondary | ICD-10-CM | POA: Diagnosis not present

## 2022-11-11 DIAGNOSIS — J019 Acute sinusitis, unspecified: Secondary | ICD-10-CM | POA: Diagnosis not present

## 2022-11-11 DIAGNOSIS — G912 (Idiopathic) normal pressure hydrocephalus: Secondary | ICD-10-CM | POA: Diagnosis not present

## 2022-11-12 DIAGNOSIS — L988 Other specified disorders of the skin and subcutaneous tissue: Secondary | ICD-10-CM | POA: Diagnosis not present

## 2022-11-12 DIAGNOSIS — L570 Actinic keratosis: Secondary | ICD-10-CM | POA: Diagnosis not present

## 2022-11-12 DIAGNOSIS — C4442 Squamous cell carcinoma of skin of scalp and neck: Secondary | ICD-10-CM | POA: Diagnosis not present

## 2022-11-20 DIAGNOSIS — R2689 Other abnormalities of gait and mobility: Secondary | ICD-10-CM | POA: Diagnosis not present

## 2022-11-20 DIAGNOSIS — M6281 Muscle weakness (generalized): Secondary | ICD-10-CM | POA: Diagnosis not present

## 2022-11-26 DIAGNOSIS — C4442 Squamous cell carcinoma of skin of scalp and neck: Secondary | ICD-10-CM | POA: Diagnosis not present

## 2022-12-03 DIAGNOSIS — R350 Frequency of micturition: Secondary | ICD-10-CM | POA: Diagnosis not present

## 2022-12-03 DIAGNOSIS — N401 Enlarged prostate with lower urinary tract symptoms: Secondary | ICD-10-CM | POA: Diagnosis not present

## 2022-12-21 DIAGNOSIS — R2689 Other abnormalities of gait and mobility: Secondary | ICD-10-CM | POA: Diagnosis not present

## 2022-12-21 DIAGNOSIS — M6281 Muscle weakness (generalized): Secondary | ICD-10-CM | POA: Diagnosis not present

## 2023-01-01 DIAGNOSIS — G912 (Idiopathic) normal pressure hydrocephalus: Secondary | ICD-10-CM | POA: Diagnosis not present

## 2023-01-01 DIAGNOSIS — M48061 Spinal stenosis, lumbar region without neurogenic claudication: Secondary | ICD-10-CM | POA: Diagnosis not present

## 2023-01-20 DIAGNOSIS — R2689 Other abnormalities of gait and mobility: Secondary | ICD-10-CM | POA: Diagnosis not present

## 2023-01-20 DIAGNOSIS — M6281 Muscle weakness (generalized): Secondary | ICD-10-CM | POA: Diagnosis not present

## 2023-01-28 DIAGNOSIS — H401132 Primary open-angle glaucoma, bilateral, moderate stage: Secondary | ICD-10-CM | POA: Diagnosis not present

## 2023-01-28 DIAGNOSIS — H34832 Tributary (branch) retinal vein occlusion, left eye, with macular edema: Secondary | ICD-10-CM | POA: Diagnosis not present

## 2023-02-03 DIAGNOSIS — H903 Sensorineural hearing loss, bilateral: Secondary | ICD-10-CM | POA: Diagnosis not present

## 2023-02-11 DIAGNOSIS — D2230 Melanocytic nevi of unspecified part of face: Secondary | ICD-10-CM | POA: Diagnosis not present

## 2023-02-11 DIAGNOSIS — L570 Actinic keratosis: Secondary | ICD-10-CM | POA: Diagnosis not present

## 2023-02-11 DIAGNOSIS — D2260 Melanocytic nevi of unspecified upper limb, including shoulder: Secondary | ICD-10-CM | POA: Diagnosis not present

## 2023-02-11 DIAGNOSIS — D225 Melanocytic nevi of trunk: Secondary | ICD-10-CM | POA: Diagnosis not present

## 2023-02-11 DIAGNOSIS — D224 Melanocytic nevi of scalp and neck: Secondary | ICD-10-CM | POA: Diagnosis not present

## 2023-02-20 DIAGNOSIS — M6281 Muscle weakness (generalized): Secondary | ICD-10-CM | POA: Diagnosis not present

## 2023-02-20 DIAGNOSIS — R2689 Other abnormalities of gait and mobility: Secondary | ICD-10-CM | POA: Diagnosis not present

## 2023-02-25 DIAGNOSIS — H3589 Other specified retinal disorders: Secondary | ICD-10-CM | POA: Diagnosis not present

## 2023-02-25 DIAGNOSIS — H35352 Cystoid macular degeneration, left eye: Secondary | ICD-10-CM | POA: Diagnosis not present

## 2023-03-22 DIAGNOSIS — R2689 Other abnormalities of gait and mobility: Secondary | ICD-10-CM | POA: Diagnosis not present

## 2023-03-22 DIAGNOSIS — M6281 Muscle weakness (generalized): Secondary | ICD-10-CM | POA: Diagnosis not present

## 2023-03-25 DIAGNOSIS — H35352 Cystoid macular degeneration, left eye: Secondary | ICD-10-CM | POA: Diagnosis not present

## 2023-03-25 DIAGNOSIS — H34832 Tributary (branch) retinal vein occlusion, left eye, with macular edema: Secondary | ICD-10-CM | POA: Diagnosis not present

## 2023-04-02 DIAGNOSIS — M48061 Spinal stenosis, lumbar region without neurogenic claudication: Secondary | ICD-10-CM | POA: Diagnosis not present

## 2023-04-02 DIAGNOSIS — G912 (Idiopathic) normal pressure hydrocephalus: Secondary | ICD-10-CM | POA: Diagnosis not present

## 2023-04-22 DIAGNOSIS — H34832 Tributary (branch) retinal vein occlusion, left eye, with macular edema: Secondary | ICD-10-CM | POA: Diagnosis not present

## 2023-04-22 DIAGNOSIS — R2689 Other abnormalities of gait and mobility: Secondary | ICD-10-CM | POA: Diagnosis not present

## 2023-04-22 DIAGNOSIS — H35352 Cystoid macular degeneration, left eye: Secondary | ICD-10-CM | POA: Diagnosis not present

## 2023-04-22 DIAGNOSIS — H401132 Primary open-angle glaucoma, bilateral, moderate stage: Secondary | ICD-10-CM | POA: Diagnosis not present

## 2023-04-22 DIAGNOSIS — M6281 Muscle weakness (generalized): Secondary | ICD-10-CM | POA: Diagnosis not present

## 2023-04-23 DIAGNOSIS — Z6831 Body mass index (BMI) 31.0-31.9, adult: Secondary | ICD-10-CM | POA: Diagnosis not present

## 2023-04-23 DIAGNOSIS — M419 Scoliosis, unspecified: Secondary | ICD-10-CM | POA: Diagnosis not present

## 2023-04-23 DIAGNOSIS — M47816 Spondylosis without myelopathy or radiculopathy, lumbar region: Secondary | ICD-10-CM | POA: Diagnosis not present

## 2023-05-13 DIAGNOSIS — M47816 Spondylosis without myelopathy or radiculopathy, lumbar region: Secondary | ICD-10-CM | POA: Diagnosis not present

## 2023-05-20 DIAGNOSIS — H35352 Cystoid macular degeneration, left eye: Secondary | ICD-10-CM | POA: Diagnosis not present

## 2023-05-20 DIAGNOSIS — H34832 Tributary (branch) retinal vein occlusion, left eye, with macular edema: Secondary | ICD-10-CM | POA: Diagnosis not present

## 2023-05-23 DIAGNOSIS — M6281 Muscle weakness (generalized): Secondary | ICD-10-CM | POA: Diagnosis not present

## 2023-05-23 DIAGNOSIS — R2689 Other abnormalities of gait and mobility: Secondary | ICD-10-CM | POA: Diagnosis not present

## 2023-05-29 DIAGNOSIS — M47816 Spondylosis without myelopathy or radiculopathy, lumbar region: Secondary | ICD-10-CM | POA: Diagnosis not present

## 2023-06-02 DIAGNOSIS — M545 Low back pain, unspecified: Secondary | ICD-10-CM | POA: Diagnosis not present

## 2023-06-02 DIAGNOSIS — H903 Sensorineural hearing loss, bilateral: Secondary | ICD-10-CM | POA: Diagnosis not present

## 2023-06-16 DIAGNOSIS — M47816 Spondylosis without myelopathy or radiculopathy, lumbar region: Secondary | ICD-10-CM | POA: Diagnosis not present

## 2023-06-22 DIAGNOSIS — M6281 Muscle weakness (generalized): Secondary | ICD-10-CM | POA: Diagnosis not present

## 2023-06-22 DIAGNOSIS — R2689 Other abnormalities of gait and mobility: Secondary | ICD-10-CM | POA: Diagnosis not present

## 2023-07-03 DIAGNOSIS — M47816 Spondylosis without myelopathy or radiculopathy, lumbar region: Secondary | ICD-10-CM | POA: Diagnosis not present

## 2023-07-08 DIAGNOSIS — H34832 Tributary (branch) retinal vein occlusion, left eye, with macular edema: Secondary | ICD-10-CM | POA: Diagnosis not present

## 2023-07-08 DIAGNOSIS — H35352 Cystoid macular degeneration, left eye: Secondary | ICD-10-CM | POA: Diagnosis not present

## 2023-07-08 DIAGNOSIS — H401132 Primary open-angle glaucoma, bilateral, moderate stage: Secondary | ICD-10-CM | POA: Diagnosis not present

## 2023-07-15 DIAGNOSIS — M47816 Spondylosis without myelopathy or radiculopathy, lumbar region: Secondary | ICD-10-CM | POA: Diagnosis not present

## 2023-07-23 DIAGNOSIS — M6281 Muscle weakness (generalized): Secondary | ICD-10-CM | POA: Diagnosis not present

## 2023-07-23 DIAGNOSIS — R2689 Other abnormalities of gait and mobility: Secondary | ICD-10-CM | POA: Diagnosis not present

## 2023-07-29 DIAGNOSIS — M48061 Spinal stenosis, lumbar region without neurogenic claudication: Secondary | ICD-10-CM | POA: Diagnosis not present

## 2023-07-29 DIAGNOSIS — G912 (Idiopathic) normal pressure hydrocephalus: Secondary | ICD-10-CM | POA: Diagnosis not present

## 2023-07-29 DIAGNOSIS — M5481 Occipital neuralgia: Secondary | ICD-10-CM | POA: Diagnosis not present

## 2023-07-29 DIAGNOSIS — M1990 Unspecified osteoarthritis, unspecified site: Secondary | ICD-10-CM | POA: Diagnosis not present

## 2023-07-29 DIAGNOSIS — Z79899 Other long term (current) drug therapy: Secondary | ICD-10-CM | POA: Diagnosis not present

## 2023-07-29 DIAGNOSIS — Z125 Encounter for screening for malignant neoplasm of prostate: Secondary | ICD-10-CM | POA: Diagnosis not present

## 2023-07-29 DIAGNOSIS — N400 Enlarged prostate without lower urinary tract symptoms: Secondary | ICD-10-CM | POA: Diagnosis not present

## 2023-07-29 DIAGNOSIS — R7301 Impaired fasting glucose: Secondary | ICD-10-CM | POA: Diagnosis not present

## 2023-07-29 DIAGNOSIS — K219 Gastro-esophageal reflux disease without esophagitis: Secondary | ICD-10-CM | POA: Diagnosis not present

## 2023-08-04 DIAGNOSIS — Z0001 Encounter for general adult medical examination with abnormal findings: Secondary | ICD-10-CM | POA: Diagnosis not present

## 2023-08-04 DIAGNOSIS — M48061 Spinal stenosis, lumbar region without neurogenic claudication: Secondary | ICD-10-CM | POA: Diagnosis not present

## 2023-08-04 DIAGNOSIS — A064 Amebic liver abscess: Secondary | ICD-10-CM | POA: Diagnosis not present

## 2023-08-04 DIAGNOSIS — G912 (Idiopathic) normal pressure hydrocephalus: Secondary | ICD-10-CM | POA: Diagnosis not present

## 2023-08-04 DIAGNOSIS — N400 Enlarged prostate without lower urinary tract symptoms: Secondary | ICD-10-CM | POA: Diagnosis not present

## 2023-08-04 DIAGNOSIS — C4491 Basal cell carcinoma of skin, unspecified: Secondary | ICD-10-CM | POA: Diagnosis not present

## 2023-08-04 DIAGNOSIS — K219 Gastro-esophageal reflux disease without esophagitis: Secondary | ICD-10-CM | POA: Diagnosis not present

## 2023-08-04 DIAGNOSIS — R7301 Impaired fasting glucose: Secondary | ICD-10-CM | POA: Diagnosis not present

## 2023-08-05 DIAGNOSIS — H34832 Tributary (branch) retinal vein occlusion, left eye, with macular edema: Secondary | ICD-10-CM | POA: Diagnosis not present

## 2023-08-18 DIAGNOSIS — L209 Atopic dermatitis, unspecified: Secondary | ICD-10-CM | POA: Diagnosis not present

## 2023-08-18 DIAGNOSIS — C44519 Basal cell carcinoma of skin of other part of trunk: Secondary | ICD-10-CM | POA: Diagnosis not present

## 2023-08-18 DIAGNOSIS — L570 Actinic keratosis: Secondary | ICD-10-CM | POA: Diagnosis not present

## 2023-08-18 DIAGNOSIS — L988 Other specified disorders of the skin and subcutaneous tissue: Secondary | ICD-10-CM | POA: Diagnosis not present

## 2023-08-22 DIAGNOSIS — M6281 Muscle weakness (generalized): Secondary | ICD-10-CM | POA: Diagnosis not present

## 2023-08-22 DIAGNOSIS — R2689 Other abnormalities of gait and mobility: Secondary | ICD-10-CM | POA: Diagnosis not present

## 2023-08-27 DIAGNOSIS — C44519 Basal cell carcinoma of skin of other part of trunk: Secondary | ICD-10-CM | POA: Diagnosis not present

## 2023-09-02 DIAGNOSIS — H401132 Primary open-angle glaucoma, bilateral, moderate stage: Secondary | ICD-10-CM | POA: Diagnosis not present

## 2023-09-02 DIAGNOSIS — H35352 Cystoid macular degeneration, left eye: Secondary | ICD-10-CM | POA: Diagnosis not present

## 2023-09-02 DIAGNOSIS — H3589 Other specified retinal disorders: Secondary | ICD-10-CM | POA: Diagnosis not present

## 2023-09-02 DIAGNOSIS — H34832 Tributary (branch) retinal vein occlusion, left eye, with macular edema: Secondary | ICD-10-CM | POA: Diagnosis not present

## 2023-09-22 DIAGNOSIS — R2689 Other abnormalities of gait and mobility: Secondary | ICD-10-CM | POA: Diagnosis not present

## 2023-09-22 DIAGNOSIS — M6281 Muscle weakness (generalized): Secondary | ICD-10-CM | POA: Diagnosis not present

## 2023-09-30 DIAGNOSIS — H34832 Tributary (branch) retinal vein occlusion, left eye, with macular edema: Secondary | ICD-10-CM | POA: Diagnosis not present

## 2023-10-23 DIAGNOSIS — R2689 Other abnormalities of gait and mobility: Secondary | ICD-10-CM | POA: Diagnosis not present

## 2023-10-23 DIAGNOSIS — M6281 Muscle weakness (generalized): Secondary | ICD-10-CM | POA: Diagnosis not present

## 2023-11-04 DIAGNOSIS — H3589 Other specified retinal disorders: Secondary | ICD-10-CM | POA: Diagnosis not present

## 2023-11-04 DIAGNOSIS — H35352 Cystoid macular degeneration, left eye: Secondary | ICD-10-CM | POA: Diagnosis not present

## 2023-11-04 DIAGNOSIS — H34832 Tributary (branch) retinal vein occlusion, left eye, with macular edema: Secondary | ICD-10-CM | POA: Diagnosis not present

## 2023-11-14 DIAGNOSIS — M48061 Spinal stenosis, lumbar region without neurogenic claudication: Secondary | ICD-10-CM | POA: Diagnosis not present

## 2023-11-14 DIAGNOSIS — K219 Gastro-esophageal reflux disease without esophagitis: Secondary | ICD-10-CM | POA: Diagnosis not present

## 2023-11-20 DIAGNOSIS — R2689 Other abnormalities of gait and mobility: Secondary | ICD-10-CM | POA: Diagnosis not present

## 2023-11-20 DIAGNOSIS — M6281 Muscle weakness (generalized): Secondary | ICD-10-CM | POA: Diagnosis not present

## 2023-12-01 DIAGNOSIS — H903 Sensorineural hearing loss, bilateral: Secondary | ICD-10-CM | POA: Diagnosis not present

## 2023-12-01 DIAGNOSIS — G8929 Other chronic pain: Secondary | ICD-10-CM | POA: Diagnosis not present

## 2023-12-01 DIAGNOSIS — M545 Low back pain, unspecified: Secondary | ICD-10-CM | POA: Diagnosis not present

## 2023-12-02 DIAGNOSIS — H35352 Cystoid macular degeneration, left eye: Secondary | ICD-10-CM | POA: Diagnosis not present

## 2023-12-09 DIAGNOSIS — R351 Nocturia: Secondary | ICD-10-CM | POA: Diagnosis not present

## 2023-12-09 DIAGNOSIS — R350 Frequency of micturition: Secondary | ICD-10-CM | POA: Diagnosis not present

## 2023-12-09 DIAGNOSIS — N401 Enlarged prostate with lower urinary tract symptoms: Secondary | ICD-10-CM | POA: Diagnosis not present

## 2023-12-30 DIAGNOSIS — H35352 Cystoid macular degeneration, left eye: Secondary | ICD-10-CM | POA: Diagnosis not present

## 2024-01-06 DIAGNOSIS — H35352 Cystoid macular degeneration, left eye: Secondary | ICD-10-CM | POA: Diagnosis not present

## 2024-01-06 DIAGNOSIS — H401132 Primary open-angle glaucoma, bilateral, moderate stage: Secondary | ICD-10-CM | POA: Diagnosis not present

## 2024-02-03 DIAGNOSIS — H35352 Cystoid macular degeneration, left eye: Secondary | ICD-10-CM | POA: Diagnosis not present

## 2024-02-13 DIAGNOSIS — R61 Generalized hyperhidrosis: Secondary | ICD-10-CM | POA: Diagnosis not present

## 2024-02-13 DIAGNOSIS — D7589 Other specified diseases of blood and blood-forming organs: Secondary | ICD-10-CM | POA: Diagnosis not present

## 2024-02-13 DIAGNOSIS — Z79899 Other long term (current) drug therapy: Secondary | ICD-10-CM | POA: Diagnosis not present

## 2024-02-13 DIAGNOSIS — E871 Hypo-osmolality and hyponatremia: Secondary | ICD-10-CM | POA: Diagnosis not present

## 2024-02-13 DIAGNOSIS — M48061 Spinal stenosis, lumbar region without neurogenic claudication: Secondary | ICD-10-CM | POA: Diagnosis not present

## 2024-02-13 DIAGNOSIS — N400 Enlarged prostate without lower urinary tract symptoms: Secondary | ICD-10-CM | POA: Diagnosis not present

## 2024-02-17 DIAGNOSIS — D485 Neoplasm of uncertain behavior of skin: Secondary | ICD-10-CM | POA: Diagnosis not present

## 2024-02-17 DIAGNOSIS — L821 Other seborrheic keratosis: Secondary | ICD-10-CM | POA: Diagnosis not present

## 2024-02-17 DIAGNOSIS — L988 Other specified disorders of the skin and subcutaneous tissue: Secondary | ICD-10-CM | POA: Diagnosis not present

## 2024-02-17 DIAGNOSIS — L570 Actinic keratosis: Secondary | ICD-10-CM | POA: Diagnosis not present

## 2024-02-25 ENCOUNTER — Inpatient Hospital Stay

## 2024-02-25 ENCOUNTER — Inpatient Hospital Stay: Attending: Oncology | Admitting: Oncology

## 2024-02-25 VITALS — BP 135/71 | HR 61 | Temp 97.3°F | Resp 20 | Wt 219.8 lb

## 2024-02-25 DIAGNOSIS — D72820 Lymphocytosis (symptomatic): Secondary | ICD-10-CM

## 2024-02-25 DIAGNOSIS — R61 Generalized hyperhidrosis: Secondary | ICD-10-CM | POA: Diagnosis not present

## 2024-02-25 DIAGNOSIS — D72829 Elevated white blood cell count, unspecified: Secondary | ICD-10-CM | POA: Insufficient documentation

## 2024-02-25 LAB — COMPREHENSIVE METABOLIC PANEL WITH GFR
ALT: 11 U/L (ref 0–44)
AST: 13 U/L — ABNORMAL LOW (ref 15–41)
Albumin: 3.7 g/dL (ref 3.5–5.0)
Alkaline Phosphatase: 88 U/L (ref 38–126)
Anion gap: 10 (ref 5–15)
BUN: 18 mg/dL (ref 8–23)
CO2: 20 mmol/L — ABNORMAL LOW (ref 22–32)
Calcium: 9.4 mg/dL (ref 8.9–10.3)
Chloride: 101 mmol/L (ref 98–111)
Creatinine, Ser: 0.96 mg/dL (ref 0.61–1.24)
GFR, Estimated: >60 mL/min (ref >60)
Glucose, Bld: 110 mg/dL — ABNORMAL HIGH (ref 70–99)
Potassium: 4.7 mmol/L (ref 3.5–5.1)
Sodium: 131 mmol/L — ABNORMAL LOW (ref 135–145)
Total Bilirubin: 0.7 mg/dL (ref 0.0–1.2)
Total Protein: 6.9 g/dL (ref 6.5–8.1)

## 2024-02-25 LAB — CBC WITH DIFFERENTIAL/PLATELET
Abs Immature Granulocytes: 0 10*3/uL (ref 0.00–0.07)
Basophils Absolute: 0.3 10*3/uL — ABNORMAL HIGH (ref 0.0–0.1)
Basophils Relative: 2 %
Eosinophils Absolute: 0 10*3/uL (ref 0.0–0.5)
Eosinophils Relative: 0 %
HCT: 44 % (ref 39.0–52.0)
Hemoglobin: 15.2 g/dL (ref 13.0–17.0)
Lymphocytes Relative: 45 %
Lymphs Abs: 6.9 10*3/uL — ABNORMAL HIGH (ref 0.7–4.0)
MCH: 31.1 pg (ref 26.0–34.0)
MCHC: 34.5 g/dL (ref 30.0–36.0)
MCV: 90 fL (ref 80.0–100.0)
Monocytes Absolute: 2 10*3/uL — ABNORMAL HIGH (ref 0.1–1.0)
Monocytes Relative: 13 %
Neutro Abs: 6.1 10*3/uL (ref 1.7–7.7)
Neutrophils Relative %: 40 %
Platelets: 258 10*3/uL (ref 150–400)
RBC: 4.89 MIL/uL (ref 4.22–5.81)
RDW: 13.6 % (ref 11.5–15.5)
WBC: 15.3 10*3/uL — ABNORMAL HIGH (ref 4.0–10.5)
nRBC: 0 % (ref 0.0–0.2)

## 2024-02-25 LAB — URIC ACID: Uric Acid, Serum: 5.3 mg/dL (ref 3.7–8.6)

## 2024-02-25 LAB — LACTATE DEHYDROGENASE: LDH: 138 U/L (ref 98–192)

## 2024-02-25 LAB — SEDIMENTATION RATE: Sed Rate: 13 mm/h (ref 0–20)

## 2024-02-25 LAB — C-REACTIVE PROTEIN: CRP: <0.5 mg/dL (ref <1.0)

## 2024-02-25 NOTE — Progress Notes (Signed)
 Jeffrey Wheeler  HEMATOLOGY NEW VISIT  Jeffrey Bile, MD  REASON FOR REFERRAL: Lymphocytosis with night Sweats and weight Loss   HISTORY OF PRESENT ILLNESS: Jeffrey Wheeler 88 y.o. male referred for lymphocytosis with night sweats and weight loss. He has a past medical history of BPH, spinal stenosis, chronic history of back pain.The patient is accompanied by his wife today.  Over the past four to five years, he has experienced night sweats occurring at least three times a week. During these episodes, he wakes up with soaked clothes and feels cold, though he does not shiver.   He reports a significant decrease in appetite over the past two years, managing only small meals like sandwiches. Despite this, he has not experienced any weight loss. He also experiences fatigue, describing an episode where he felt suddenly tired while driving, which has persisted. He often sleeps during the day for five to six hours.  He has a history of back issues, including a shunt placement for excess spinal fluid. He experiences short-lived headaches and bowel movement irregularities, requiring the use of Senokot and Uncle John's brand for relief. His caregiver mentions limited physical activity due to back problems, which restricts his ability to engage in activities he used to enjoy. He uses a walker for mobility.  No fever or chills. He denies smoking or alcohol use. His family history includes a mother with multiple myeloma.   I have reviewed the past medical history, past surgical history, social history and family history with the patient   ALLERGIES:  has no known allergies.  MEDICATIONS:  Current Outpatient Medications  Medication Sig Dispense Refill   finasteride  (PROSCAR ) 5 MG tablet SMARTSIG:1 Tablet(s) By Mouth     aspirin  EC 81 MG tablet Take 1 tablet (81 mg total) by mouth at bedtime. Restart on 08/27/20 30 tablet 11   docusate sodium  (COLACE) 100 MG capsule Take  300 mg by mouth at bedtime.     doxazosin  (CARDURA ) 8 MG tablet Take 8 mg by mouth at bedtime.      latanoprost  (XALATAN ) 0.005 % ophthalmic solution Place 1 drop into both eyes at bedtime.     ondansetron  (ZOFRAN ) 4 MG tablet Take 1 tablet (4 mg total) by mouth every 8 (eight) hours as needed for nausea or vomiting. 10 tablet 0   pantoprazole  (PROTONIX ) 40 MG tablet Take 40 mg by mouth 2 (two) times daily before a meal.      traMADol  (ULTRAM ) 50 MG tablet Take 1-2 tablets (50-100 mg total) by mouth every 6 (six) hours as needed for moderate pain or severe pain. 10 tablet 0   vitamin B-12 (CYANOCOBALAMIN ) 1000 MCG tablet Take 1,000 mcg by mouth at bedtime.     No current facility-administered medications for this visit.     REVIEW OF SYSTEMS:   Constitutional: Denies fevers, chills or night sweats Eyes: Denies blurriness of vision Ears, nose, mouth, throat, and face: Denies mucositis or sore throat Respiratory: Denies cough, dyspnea or wheezes Cardiovascular: Denies palpitation, chest discomfort or lower extremity swelling Gastrointestinal:  Denies nausea, heartburn or change in bowel habits Skin: Denies abnormal skin rashes Lymphatics: Denies new lymphadenopathy or easy bruising Neurological:Denies numbness, tingling or new weaknesses Behavioral/Psych: Mood is stable, no new changes  All other systems were reviewed with the patient and are negative.  PHYSICAL EXAMINATION:   Vitals:   02/25/24 1113  BP: 135/71  Pulse: 61  Resp: 20  Temp: (!) 97.3 F (36.3 C)  SpO2: 97%    GENERAL:alert, no distress and comfortable SKIN: skin color, texture, turgor are normal, no rashes or significant lesions, a patient in the right frontal region LYMPH:  no palpable lymphadenopathy in the cervical, axillary or inguinal LUNGS: clear to auscultation and percussion with normal breathing effort HEART: regular rate & rhythm and no murmurs and no lower extremity edema ABDOMEN:abdomen soft,  non-tender and normal bowel sounds Musculoskeletal:no cyanosis of digits and no clubbing  NEURO: alert & oriented x 3 with fluent speech  LABORATORY DATA:  I have reviewed the data as listed labs reviewed from his primary care office done on 02/13/2024 CBC: WBC: 13.7, hemoglobin: 15, platelets: 260, absolute lymphocytes: 6.9, monocytes: 2.0 CMP: WNL  Lab Results  Component Value Date   WBC 15.3 (H) 02/25/2024   NEUTROABS PENDING 02/25/2024   HGB 15.2 02/25/2024   HCT 44.0 02/25/2024   MCV 90.0 02/25/2024   PLT 258 02/25/2024     Chemistry      Component Value Date/Time   NA 131 (L) 02/25/2024 1154   K 4.7 02/25/2024 1154   CL 101 02/25/2024 1154   CO2 20 (L) 02/25/2024 1154   BUN 18 02/25/2024 1154   CREATININE 0.96 02/25/2024 1154      Component Value Date/Time   CALCIUM 9.4 02/25/2024 1154   ALKPHOS 88 02/25/2024 1154   AST 13 (L) 02/25/2024 1154   ALT 11 02/25/2024 1154   BILITOT 0.7 02/25/2024 1154      Latest Reference Range & Units 09/02/14 14:18 09/13/14 05:15 09/14/14 06:32 09/14/14 10:43 08/23/20 23:05 12/18/20 16:15 12/19/20 05:24 12/20/20 05:49 12/21/20 06:57 12/22/20 05:06 12/23/20 05:55 12/24/20 04:24 09/08/22 10:18 11/07/22 11:29  WBC 4.0 - 10.5 K/uL 8.0 8.7 14.4 (H) 15.0 (H) 14.2 (H) 25.3 (H) 19.8 (H) 16.7 (H) 14.9 (H) 14.3 (H) 10.6 (H) 10.2 11.9 (H) 13.2 (H)  (H): Data is abnormally high  RADIOGRAPHIC STUDIES: I have personally reviewed the radiological images as listed and agreed with the findings in the report.  None to review  ASSESSMENT & PLAN:  Patient is a 88 y.o. male referred for leukocytosis with night sweats and weight loss  Leukocytosis Patient has leukocytosis with lymphocyte predominance.  Of note, chronic leukocytosis since 2015 Patient reports some B symptoms with night sweats occurring at least twice a week and weight loss No lymphadenopathy or splenomegaly palpated Differential to include CLL or leukocytosis secondary to chronic  pain  - Will obtain blood work today including a repeat CBC with differential, flow cytometry, LDH, uric acid, ESR, CRP - Discussed with the patient that even if this is CLL, he might not meet criteria for treatment and has good prognosis  Return to clinic in 2 weeks to discuss results and further management   Orders Placed This Encounter  Procedures   CBC with Differential/Platelet    Standing Status:   Future    Number of Occurrences:   1    Expected Date:   02/25/2024    Expiration Date:   02/24/2025   Comprehensive metabolic panel with GFR    Standing Status:   Future    Number of Occurrences:   1    Expected Date:   02/25/2024    Expiration Date:   02/24/2025   Lactate dehydrogenase    Standing Status:   Future    Number of Occurrences:   1    Expected Date:   02/25/2024    Expiration Date:   02/24/2025   Uric acid  Standing Status:   Future    Number of Occurrences:   1    Expected Date:   02/25/2024    Expiration Date:   02/24/2025   Flow Cytometry, Peripheral Blood (Oncology)    Standing Status:   Future    Number of Occurrences:   1    Expected Date:   02/25/2024    Expiration Date:   02/24/2025   Sedimentation rate    Standing Status:   Future    Number of Occurrences:   1    Expected Date:   02/25/2024    Expiration Date:   02/24/2025   C-reactive protein    Standing Status:   Future    Number of Occurrences:   1    Expected Date:   02/25/2024    Expiration Date:   02/24/2025    The total time spent in the appointment was 40 minutes encounter with patients including review of chart and various tests results, discussions about plan of care and coordination of care plan   All questions were answered. The patient knows to call the clinic with any problems, questions or concerns. No barriers to learning was detected.   Eduardo Grade, MD 6/11/20251:05 PM

## 2024-02-25 NOTE — Patient Instructions (Signed)
 VISIT SUMMARY:  Today, you were seen for fatigue and night sweats. We discussed your elevated white blood cell count and associated symptoms, including your history of back issues and decreased appetite. Your caregiver was present during the visit.  YOUR PLAN:  -CHRONIC LYMPHOCYTIC LEUKEMIA (CLL): CLL is a type of cancer that affects the blood and bone marrow, leading to an increased number of white blood cells. We suspect this due to your elevated white blood cell count, night sweats, and fatigue. We will conduct blood tests to confirm the diagnosis and discuss the results in two weeks.  INSTRUCTIONS:  Please complete the blood tests as ordered. We will meet again in two weeks to discuss the results and next steps. If you experience any new or worsening symptoms, please contact our office immediately.

## 2024-02-25 NOTE — Assessment & Plan Note (Signed)
 Patient has leukocytosis with lymphocyte predominance.  Of note, chronic leukocytosis since 2015 Patient reports some B symptoms with night sweats occurring at least twice a week and weight loss No lymphadenopathy or splenomegaly palpated Differential to include CLL or leukocytosis secondary to chronic pain  - Will obtain blood work today including a repeat CBC with differential, flow cytometry, LDH, uric acid, ESR, CRP - Discussed with the patient that even if this is CLL, he might not meet criteria for treatment and has good prognosis  Return to clinic in 2 weeks to discuss results and further management

## 2024-03-02 DIAGNOSIS — L821 Other seborrheic keratosis: Secondary | ICD-10-CM | POA: Diagnosis not present

## 2024-03-02 LAB — SURGICAL PATHOLOGY

## 2024-03-03 LAB — FLOW CYTOMETRY

## 2024-03-10 ENCOUNTER — Inpatient Hospital Stay

## 2024-03-10 ENCOUNTER — Inpatient Hospital Stay: Admitting: Oncology

## 2024-03-10 VITALS — BP 103/86 | HR 63 | Temp 97.7°F | Resp 18

## 2024-03-10 DIAGNOSIS — C9110 Chronic lymphocytic leukemia of B-cell type not having achieved remission: Secondary | ICD-10-CM | POA: Insufficient documentation

## 2024-03-10 DIAGNOSIS — D72820 Lymphocytosis (symptomatic): Secondary | ICD-10-CM | POA: Diagnosis not present

## 2024-03-10 DIAGNOSIS — R61 Generalized hyperhidrosis: Secondary | ICD-10-CM | POA: Diagnosis not present

## 2024-03-10 NOTE — Assessment & Plan Note (Addendum)
 Patient with chronic leukocytosis present since 2015 REI stage 0.  Patient has some night sweats.  No other B symptoms. CT abdomen pelvis from 2022 showed no evidence of hepatosplenomegaly No thrombocytopenia or anemia No palpable lymphadenopathy  - Will send for CLL FISH today - Will monitor counts every 3 months and assess for lymphadenopathy, hepatosplenomegaly - Patient does not meet criteria for treatment at this time.  If at all he has worsening B symptoms, can consider treating with Zanubrutinib starting at a low dose of 80 mg BID.  Return to clinic in 3 months with labs

## 2024-03-10 NOTE — Progress Notes (Signed)
 [facility]  HEMATOLOGY FOLLOW-UP VISIT  Sheryle Carwin, MD  REASON FOR FOLLOW-UP: CLL  ASSESSMENT & PLAN:  Patient is a 88 y.o. male following for CLL  CLL (chronic lymphocytic leukemia) (HCC) Patient with chronic leukocytosis present since 2015 REI stage 0.  Patient has some night sweats.  No other B symptoms. CT abdomen pelvis from 2022 showed no evidence of hepatosplenomegaly No thrombocytopenia or anemia No palpable lymphadenopathy  - Will send for CLL FISH today - Will monitor counts every 3 months and assess for lymphadenopathy, hepatosplenomegaly - Patient does not meet criteria for treatment at this time.  If at all he has worsening B symptoms, can consider treating with Zanubrutinib starting at a low dose of 80 mg BID.  Return to clinic in 3 months with labs   Orders Placed This Encounter  Procedures   Chronic Lymphocytic Leukemia (CLL) Profile, FISH   CBC with Differential/Platelet    Standing Status:   Future    Expected Date:   06/07/2024    Expiration Date:   09/05/2024   Comprehensive metabolic panel with GFR    Standing Status:   Future    Expected Date:   06/07/2024    Expiration Date:   09/05/2024   Uric acid    Standing Status:   Future    Expected Date:   06/07/2024    Expiration Date:   09/05/2024   Lactate dehydrogenase    Standing Status:   Future    Expected Date:   06/07/2024    Expiration Date:   09/05/2024    The total time spent in the appointment was 20 minutes encounter with patients including review of chart and various tests results, discussions about plan of care and coordination of care plan   All questions were answered. The patient knows to call the clinic with any problems, questions or concerns. No barriers to learning was detected.  Jeffrey Dry, MD 6/25/20253:20 PM   SUMMARY OF HEMATOLOGIC HISTORY: - CLL: RAI stage 0   INTERVAL HISTORY: Jeffrey Wheeler 88 y.o. male following for  leukocytosis. Patient was accompanied by his wife today.He has been experiencing night sweats two to three times a month for the past one to two months, significant enough to wake him up. He also experiences frequent fatigue, which he finds bothersome. No significant weight loss, loss of appetite, fevers, or chills are present.  He has no other complaints.  Believes that he has been doing good for his age.   I have reviewed the past medical history, past surgical history, social history and family history with the patient   ALLERGIES:  has no known allergies.  MEDICATIONS:  Current Outpatient Medications  Medication Sig Dispense Refill   dorzolamide -timolol  (COSOPT ) 2-0.5 % ophthalmic solution Place 1 drop into both eyes 2 (two) times daily.     aspirin  EC 81 MG tablet Take 1 tablet (81 mg total) by mouth at bedtime. Restart on 08/27/20 30 tablet 11   docusate sodium  (COLACE) 100 MG capsule Take 300 mg by mouth at bedtime.     doxazosin  (CARDURA ) 8 MG tablet Take 8 mg by mouth at bedtime.      finasteride  (PROSCAR ) 5 MG tablet SMARTSIG:1 Tablet(s) By Mouth     latanoprost  (XALATAN ) 0.005 % ophthalmic solution Place 1 drop into both eyes at bedtime.     MYRBETRIQ  50 MG TB24 tablet Take 1 tablet by mouth daily.     ondansetron  (ZOFRAN ) 4 MG  tablet Take 1 tablet (4 mg total) by mouth every 8 (eight) hours as needed for nausea or vomiting. (Patient not taking: Reported on 03/10/2024) 10 tablet 0   pantoprazole  (PROTONIX ) 40 MG tablet Take 40 mg by mouth 2 (two) times daily before a meal.      traMADol  (ULTRAM ) 50 MG tablet Take 1-2 tablets (50-100 mg total) by mouth every 6 (six) hours as needed for moderate pain or severe pain. 10 tablet 0   vitamin B-12 (CYANOCOBALAMIN ) 1000 MCG tablet Take 1,000 mcg by mouth at bedtime.     No current facility-administered medications for this visit.     REVIEW OF SYSTEMS:   Constitutional: Denies fevers, chills or night sweats Eyes: Denies blurriness of  vision Ears, nose, mouth, throat, and face: Denies mucositis or sore throat Respiratory: Denies cough, dyspnea or wheezes Cardiovascular: Denies palpitation, chest discomfort or lower extremity swelling Gastrointestinal:  Denies nausea, heartburn or change in bowel habits Skin: Denies abnormal skin rashes Lymphatics: Denies new lymphadenopathy or easy bruising Neurological:Denies numbness, tingling or new weaknesses Behavioral/Psych: Mood is stable, no new changes  All other systems were reviewed with the patient and are negative.  PHYSICAL EXAMINATION:   Vitals:   03/10/24 1342  BP: 103/86  Pulse: 63  Resp: 18  Temp: 97.7 F (36.5 C)  SpO2: 96%    GENERAL:alert, no distress and comfortable SKIN: skin color, texture, turgor are normal, no rashes or significant lesions LYMPH:  no palpable lymphadenopathy in the cervical, axillary or inguinal LUNGS: clear to auscultation and percussion with normal breathing effort HEART: regular rate & rhythm and no murmurs and no lower extremity edema ABDOMEN:abdomen soft, non-tender and normal bowel sounds Musculoskeletal:no cyanosis of digits and no clubbing  NEURO: alert & oriented x 3 with fluent speech  LABORATORY DATA:  I have reviewed the data as listed  Lab Results  Component Value Date   WBC 15.3 (H) 02/25/2024   NEUTROABS 6.1 02/25/2024   HGB 15.2 02/25/2024   HCT 44.0 02/25/2024   MCV 90.0 02/25/2024   PLT 258 02/25/2024        Chemistry      Component Value Date/Time   NA 131 (L) 02/25/2024 1154   K 4.7 02/25/2024 1154   CL 101 02/25/2024 1154   CO2 20 (L) 02/25/2024 1154   BUN 18 02/25/2024 1154   CREATININE 0.96 02/25/2024 1154      Component Value Date/Time   CALCIUM 9.4 02/25/2024 1154   ALKPHOS 88 02/25/2024 1154   AST 13 (L) 02/25/2024 1154   ALT 11 02/25/2024 1154   BILITOT 0.7 02/25/2024 1154      Latest Reference Range & Units 02/25/24 11:54  LDH 98 - 192 U/L 138  CRP <1.0 mg/dL <9.4    Latest  Reference Range & Units 02/25/24 11:54  Sed Rate 0 - 20 mm/hr 13    Flow cytometry:  DIAGNOSIS:   Peripheral blood, flow cytometry:  -  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    RADIOGRAPHIC STUDIES: I have personally reviewed the radiological images as listed and agreed with the findings in the report.  None new to review

## 2024-03-11 ENCOUNTER — Ambulatory Visit: Admitting: Hematology

## 2024-03-16 DIAGNOSIS — H34832 Tributary (branch) retinal vein occlusion, left eye, with macular edema: Secondary | ICD-10-CM | POA: Diagnosis not present

## 2024-03-16 DIAGNOSIS — H35352 Cystoid macular degeneration, left eye: Secondary | ICD-10-CM | POA: Diagnosis not present

## 2024-03-22 LAB — FISH HES LEUKEMIA, 4Q12 REA

## 2024-04-02 DIAGNOSIS — M48061 Spinal stenosis, lumbar region without neurogenic claudication: Secondary | ICD-10-CM | POA: Diagnosis not present

## 2024-04-02 DIAGNOSIS — C9110 Chronic lymphocytic leukemia of B-cell type not having achieved remission: Secondary | ICD-10-CM | POA: Diagnosis not present

## 2024-04-02 DIAGNOSIS — E871 Hypo-osmolality and hyponatremia: Secondary | ICD-10-CM | POA: Diagnosis not present

## 2024-04-02 DIAGNOSIS — R030 Elevated blood-pressure reading, without diagnosis of hypertension: Secondary | ICD-10-CM | POA: Diagnosis not present

## 2024-04-13 DIAGNOSIS — H34832 Tributary (branch) retinal vein occlusion, left eye, with macular edema: Secondary | ICD-10-CM | POA: Diagnosis not present

## 2024-04-13 DIAGNOSIS — H35352 Cystoid macular degeneration, left eye: Secondary | ICD-10-CM | POA: Diagnosis not present

## 2024-04-22 DIAGNOSIS — G4730 Sleep apnea, unspecified: Secondary | ICD-10-CM | POA: Diagnosis not present

## 2024-04-28 DIAGNOSIS — H35352 Cystoid macular degeneration, left eye: Secondary | ICD-10-CM | POA: Diagnosis not present

## 2024-04-29 DIAGNOSIS — R030 Elevated blood-pressure reading, without diagnosis of hypertension: Secondary | ICD-10-CM | POA: Diagnosis not present

## 2024-04-29 DIAGNOSIS — R5382 Chronic fatigue, unspecified: Secondary | ICD-10-CM | POA: Diagnosis not present

## 2024-04-29 DIAGNOSIS — M48061 Spinal stenosis, lumbar region without neurogenic claudication: Secondary | ICD-10-CM | POA: Diagnosis not present

## 2024-05-18 ENCOUNTER — Telehealth: Payer: Self-pay

## 2024-05-18 NOTE — Telephone Encounter (Signed)
Called for precharting

## 2024-05-18 NOTE — Progress Notes (Unsigned)
 New Patient Pulmonology Office Visit   Subjective:  Patient ID: Jeffrey Wheeler, male    DOB: 01/21/1936  MRN: [id number]  Referred by: Sheryle Carwin, MD  CC: No chief complaint on file.   HPI Jeffrey Wheeler is a 88 y.o. male with PMH of HTN, CLL, and obstructive sleep apnea who presents for initial evaluation of the latter.  Underwent HST on 04/22/24, found to have severe sleep apnea with AHI 26. He had significant desaturation with SPO2 < 88% or 10 minutes. SPO2  nadir was 79%.  Sleep history The patient goes to bed at *** PM and falls asleep within *** minutes. They have *** awakenings at night due to ***. They do/do not fall asleep easily after returning to bed. The patient does/does not report snoring. The patient does/does not report witnessed apneas. The patient does/does not report morning headaches or dry mouth. The patient does/does not complain of excessive daytime sleepiness. The patient does/does not take naps. The patient gets out of bed at ***AM. The patient does/does not drink *** cups of coffee per day.   The Epworth Sleepiness score is ***/24.   STOP BANG: [ ]  Snoring - do you snore loudly? [ ]  Tired - do you feel tired, fatigued, or sleepy during daytime? [ ]  Observed apneas? [ ]  Pressure - hx of high blood pressure? [ ]  BMI? [ ]  Age > 50? [ ]  Neck circumference: > 16 inch or 40 cm collar? [ ]  Male Gender  Total - ***/8 (>2 moderate risk, >4 high risk)   {PULM QUESTIONNAIRES (Optional):33196}  ROS  Allergies: Patient has no known allergies.  Current Outpatient Medications:    aspirin  EC 81 MG tablet, Take 1 tablet (81 mg total) by mouth at bedtime. Restart on 08/27/20, Disp: 30 tablet, Rfl: 11   docusate sodium  (COLACE) 100 MG capsule, Take 300 mg by mouth at bedtime., Disp: , Rfl:    dorzolamide -timolol  (COSOPT ) 2-0.5 % ophthalmic solution, Place 1 drop into both eyes 2 (two) times daily., Disp: , Rfl:    doxazosin  (CARDURA ) 8 MG tablet, Take 8 mg by mouth at bedtime. ,  Disp: , Rfl:    finasteride  (PROSCAR ) 5 MG tablet, SMARTSIG:1 Tablet(s) By Mouth, Disp: , Rfl:    latanoprost  (XALATAN ) 0.005 % ophthalmic solution, Place 1 drop into both eyes at bedtime., Disp: , Rfl:    MYRBETRIQ  50 MG TB24 tablet, Take 1 tablet by mouth daily., Disp: , Rfl:    ondansetron  (ZOFRAN ) 4 MG tablet, Take 1 tablet (4 mg total) by mouth every 8 (eight) hours as needed for nausea or vomiting. (Patient not taking: Reported on 03/10/2024), Disp: 10 tablet, Rfl: 0   pantoprazole  (PROTONIX ) 40 MG tablet, Take 40 mg by mouth 2 (two) times daily before a meal. , Disp: , Rfl:    traMADol  (ULTRAM ) 50 MG tablet, Take 1-2 tablets (50-100 mg total) by mouth every 6 (six) hours as needed for moderate pain or severe pain., Disp: 10 tablet, Rfl: 0   vitamin B-12 (CYANOCOBALAMIN ) 1000 MCG tablet, Take 1,000 mcg by mouth at bedtime., Disp: , Rfl:  Past Medical History:  Diagnosis Date   Back pain    BPH (benign prostatic hyperplasia)    Cancer (HCC)    basal cell skin cancer   Coxsackie viruses    age 89   Gastroesophageal reflux disease    Generalized hyperhidrosis    GERD (gastroesophageal reflux disease)    Glaucoma    High cholesterol  Osteoarthritis    Pneumonia    aspiration pneumonia   Primary localized osteoarthritis of right knee    Prostate disease    Scoliosis    Past Surgical History:  Procedure Laterality Date   APPENDECTOMY  1969   CHOLECYSTECTOMY N/A 12/20/2020   Procedure: LAPAROSCOPIC CHOLECYSTECTOMY;  Surgeon: Kallie Manuelita BROCKS, MD;  Location: [facility];  Service: General;  Laterality: N/A;   EYE SURGERY     laser surgery for glaucoma, cataract surgery on both eyes   KNEE ARTHROSCOPY W/ MENISCECTOMY Right    TONSILLECTOMY     TOTAL KNEE ARTHROPLASTY Right 09/12/2014   Procedure: RIGHT TOTAL KNEE ARTHROPLASTY;  Surgeon: Lamar DELENA Millman, MD;  Location: [facility] OR;  Service: Orthopedics;  Laterality: Right;   TUMOR EXCISION Right 40 yrs ago   calf   VENTRICULOPERITONEAL  SHUNT Right 08/23/2020   Procedure: Right ventriculoperitoneal shunt placement;  Surgeon: Cheryle Debby DELENA, MD;  Location: [facility];  Service: Neurosurgery;  Laterality: Right;   Family History  Problem Relation Age of Onset   Diabetes Other    CAD Other    Hypertension Other    CVA Other    Multiple myeloma Mother    Cirrhosis Father    Social History   Socioeconomic History   Marital status: Married    Spouse name: Not on file   Number of children: Not on file   Years of education: Not on file   Highest education level: Not on file  Occupational History   Not on file  Tobacco Use   Smoking status: Former   Smokeless tobacco: Former    Quit date: 08/31/2000  Substance and Sexual Activity   Alcohol use: Yes    Alcohol/week: 3.0 standard drinks of alcohol    Types: 3 Shots of liquor per week    Comment: occasional   Drug use: No   Sexual activity: Not on file  Other Topics Concern   Not on file  Social History Narrative   Not on file   Social Drivers of Health   Financial Resource Strain: Not on file  Food Insecurity: No Food Insecurity (02/25/2024)   Hunger Vital Sign    Worried About Running Out of Food in the Last Year: Never true    Ran Out of Food in the Last Year: Never true  Transportation Needs: No Transportation Needs (02/25/2024)   PRAPARE - Administrator, Civil Service (Medical): No    Lack of Transportation (Non-Medical): No  Physical Activity: Not on file  Stress: Not on file  Social Connections: Not on file  Intimate Partner Violence: Not At Risk (02/25/2024)   Humiliation, Afraid, Rape, and Kick questionnaire    Fear of Current or Ex-Partner: No    Emotionally Abused: No    Physically Abused: No    Sexually Abused: No       Objective:  There were no vitals taken for this visit. {Pulm Vitals (Optional):32837}  Physical Exam  Diagnostic Review:  {Labs (Optional):32838}     Assessment & Plan:   Assessment & Plan   No  orders of the defined types were placed in this encounter.     No follow-ups on file.   Jeffrey Salehi, MD

## 2024-05-19 ENCOUNTER — Encounter: Payer: Self-pay | Admitting: Pulmonary Disease

## 2024-05-19 ENCOUNTER — Ambulatory Visit: Admitting: Pulmonary Disease

## 2024-05-19 VITALS — BP 128/81 | HR 73 | Ht 70.0 in | Wt 219.4 lb

## 2024-05-19 DIAGNOSIS — I10 Essential (primary) hypertension: Secondary | ICD-10-CM | POA: Diagnosis not present

## 2024-05-19 DIAGNOSIS — G4733 Obstructive sleep apnea (adult) (pediatric): Secondary | ICD-10-CM

## 2024-05-19 DIAGNOSIS — E66811 Obesity, class 1: Secondary | ICD-10-CM

## 2024-05-19 DIAGNOSIS — Z6831 Body mass index (BMI) 31.0-31.9, adult: Secondary | ICD-10-CM

## 2024-05-19 NOTE — Patient Instructions (Signed)
-   Please get CPAP titration test done - Come back after that is done

## 2024-05-26 DIAGNOSIS — Z961 Presence of intraocular lens: Secondary | ICD-10-CM | POA: Diagnosis not present

## 2024-05-26 DIAGNOSIS — H35352 Cystoid macular degeneration, left eye: Secondary | ICD-10-CM | POA: Diagnosis not present

## 2024-05-26 DIAGNOSIS — H409 Unspecified glaucoma: Secondary | ICD-10-CM | POA: Diagnosis not present

## 2024-05-26 DIAGNOSIS — H4052X0 Glaucoma secondary to other eye disorders, left eye, stage unspecified: Secondary | ICD-10-CM | POA: Diagnosis not present

## 2024-05-26 DIAGNOSIS — H34832 Tributary (branch) retinal vein occlusion, left eye, with macular edema: Secondary | ICD-10-CM | POA: Diagnosis not present

## 2024-06-02 ENCOUNTER — Inpatient Hospital Stay: Attending: Oncology

## 2024-06-02 DIAGNOSIS — R61 Generalized hyperhidrosis: Secondary | ICD-10-CM | POA: Diagnosis not present

## 2024-06-02 DIAGNOSIS — C9110 Chronic lymphocytic leukemia of B-cell type not having achieved remission: Secondary | ICD-10-CM | POA: Insufficient documentation

## 2024-06-02 LAB — CBC WITH DIFFERENTIAL/PLATELET
Abs Immature Granulocytes: 0.06 K/uL (ref 0.00–0.07)
Basophils Absolute: 0.1 K/uL (ref 0.0–0.1)
Basophils Relative: 1 %
Eosinophils Absolute: 0.2 K/uL (ref 0.0–0.5)
Eosinophils Relative: 1 %
HCT: 43.8 % (ref 39.0–52.0)
Hemoglobin: 15.1 g/dL (ref 13.0–17.0)
Immature Granulocytes: 0 %
Lymphocytes Relative: 41 %
Lymphs Abs: 6.3 K/uL — ABNORMAL HIGH (ref 0.7–4.0)
MCH: 30.8 pg (ref 26.0–34.0)
MCHC: 34.5 g/dL (ref 30.0–36.0)
MCV: 89.4 fL (ref 80.0–100.0)
Monocytes Absolute: 3.5 K/uL — ABNORMAL HIGH (ref 0.1–1.0)
Monocytes Relative: 23 %
Neutro Abs: 5.3 K/uL (ref 1.7–7.7)
Neutrophils Relative %: 34 %
Platelets: 277 K/uL (ref 150–400)
RBC: 4.9 MIL/uL (ref 4.22–5.81)
RDW: 13.8 % (ref 11.5–15.5)
Smear Review: NORMAL
WBC: 15.3 K/uL — ABNORMAL HIGH (ref 4.0–10.5)
nRBC: 0 % (ref 0.0–0.2)

## 2024-06-02 LAB — COMPREHENSIVE METABOLIC PANEL WITH GFR
ALT: 15 U/L (ref 0–44)
AST: 16 U/L (ref 15–41)
Albumin: 3.7 g/dL (ref 3.5–5.0)
Alkaline Phosphatase: 91 U/L (ref 38–126)
Anion gap: 12 (ref 5–15)
BUN: 10 mg/dL (ref 8–23)
CO2: 21 mmol/L — ABNORMAL LOW (ref 22–32)
Calcium: 9.3 mg/dL (ref 8.9–10.3)
Chloride: 100 mmol/L (ref 98–111)
Creatinine, Ser: 0.91 mg/dL (ref 0.61–1.24)
GFR, Estimated: >60 mL/min (ref >60)
Glucose, Bld: 122 mg/dL — ABNORMAL HIGH (ref 70–99)
Potassium: 4.1 mmol/L (ref 3.5–5.1)
Sodium: 133 mmol/L — ABNORMAL LOW (ref 135–145)
Total Bilirubin: 0.9 mg/dL (ref 0.0–1.2)
Total Protein: 6.9 g/dL (ref 6.5–8.1)

## 2024-06-02 LAB — URIC ACID: Uric Acid, Serum: 5 mg/dL (ref 3.7–8.6)

## 2024-06-02 LAB — LACTATE DEHYDROGENASE: LDH: 151 U/L (ref 98–192)

## 2024-06-09 ENCOUNTER — Inpatient Hospital Stay (HOSPITAL_BASED_OUTPATIENT_CLINIC_OR_DEPARTMENT_OTHER): Admitting: Oncology

## 2024-06-09 VITALS — BP 108/74 | HR 66 | Temp 96.6°F | Resp 20 | Wt 215.0 lb

## 2024-06-09 DIAGNOSIS — C9110 Chronic lymphocytic leukemia of B-cell type not having achieved remission: Secondary | ICD-10-CM

## 2024-06-09 DIAGNOSIS — R61 Generalized hyperhidrosis: Secondary | ICD-10-CM | POA: Diagnosis not present

## 2024-06-09 NOTE — Assessment & Plan Note (Addendum)
 Patient with chronic leukocytosis present since 2015 RAI stage 0.  Patient has some night sweats.  No other B symptoms. CT abdomen pelvis from 2023 showed no evidence of hepatosplenomegaly No thrombocytopenia or anemia No palpable lymphadenopathy  -Patient's wife reports stable B symptoms.  Discussed starting treatment if the symptoms worsen.  The patient and wife agreed to wait and watch at this time. -Labs reviewed today: CMP: Normal creatinine, normal LFTs, CBC: WBC: 15.3-stable, hemoglobin: 15.1, platelets: 277.  LDH: 151. - Will monitor counts every 3 months and assess for lymphadenopathy, hepatosplenomegaly - Patient does not meet criteria for treatment at this time.  If at all he has worsening B symptoms, can consider treating with Zanubrutinib starting at a low dose of 80 mg BID.  Return to clinic in 3 months with labs

## 2024-06-09 NOTE — Patient Instructions (Signed)
 [facility] at [facility] Discharge Instructions   You were seen and examined today by Dr. Davonna.  She reviewed the results of your lab work which are normal/stable.   We will see you back in 6 months. We will repeat lab work prior to this visit.   Return as scheduled.    Thank you for choosing  Cancer Center at [facility] to provide your oncology and hematology care.  To afford each patient quality time with our provider, please arrive at least 15 minutes before your scheduled appointment time.   If you have a lab appointment with the Cancer Center please come in thru the Main Entrance and check in at the main information desk.  You need to re-schedule your appointment should you arrive 10 or more minutes late.  We strive to give you quality time with our providers, and arriving late affects you and other patients whose appointments are after yours.  Also, if you no show three or more times for appointments you may be dismissed from the clinic at the providers discretion.     Again, thank you for choosing [facility].  Our hope is that these requests will decrease the amount of time that you wait before being seen by our physicians.       _____________________________________________________________  Should you have questions after your visit to [facility], please contact our office at [phone] and follow the prompts.  Our office hours are 8:00 a.m. and 4:30 p.m. Monday - Friday.  Please note that voicemails left after 4:00 p.m. may not be returned until the following business day.  We are closed weekends and major holidays.  You do have access to a nurse 24-7, just call the main number to the clinic [phone] and do not press any options, hold on the line and a nurse will answer the phone.    For prescription refill requests, have your pharmacy contact our office and allow 72 hours.    Due to Covid, you will  need to wear a mask upon entering the hospital. If you do not have a mask, a mask will be given to you at the Main Entrance upon arrival. For doctor visits, patients may have 1 support person age 4 or older with them. For treatment visits, patients can not have anyone with them due to social distancing guidelines and our immunocompromised population.

## 2024-06-09 NOTE — Progress Notes (Signed)
 [facility] at [facility]  HEMATOLOGY FOLLOW-UP VISIT  Sheryle Carwin, MD  REASON FOR FOLLOW-UP: CLL  ASSESSMENT & PLAN:  Patient is a 88 y.o. male following for CLL Assessment & Plan CLL (chronic lymphocytic leukemia) (HCC) Patient with chronic leukocytosis present since 2015 RAI stage 0.  Patient has some night sweats.  No other B symptoms. CT abdomen pelvis from 2023 showed no evidence of hepatosplenomegaly No thrombocytopenia or anemia No palpable lymphadenopathy  -Patient's wife reports stable B symptoms.  Discussed starting treatment if the symptoms worsen.  The patient and wife agreed to wait and watch at this time. -Labs reviewed today: CMP: Normal creatinine, normal LFTs, CBC: WBC: 15.3-stable, hemoglobin: 15.1, platelets: 277.  LDH: 151. - Will monitor counts every 3 months and assess for lymphadenopathy, hepatosplenomegaly - Patient does not meet criteria for treatment at this time.  If at all he has worsening B symptoms, can consider treating with Zanubrutinib starting at a low dose of 80 mg BID.  Return to clinic in 3 months with labs   Orders Placed This Encounter  Procedures   CBC with Differential    Standing Status:   Future    Expected Date:   11/30/2024    Expiration Date:   02/28/2025   Comprehensive metabolic panel    Standing Status:   Future    Expected Date:   11/30/2024    Expiration Date:   02/28/2025   Lactate dehydrogenase    Standing Status:   Future    Expected Date:   11/30/2024    Expiration Date:   02/28/2025   CBC with Differential    Standing Status:   Future    Expected Date:   09/13/2024    Expiration Date:   12/12/2024   Comprehensive metabolic panel    Standing Status:   Future    Expected Date:   09/13/2024    Expiration Date:   12/12/2024   Lactate dehydrogenase    Standing Status:   Future    Expected Date:   09/13/2024    Expiration Date:   12/12/2024    The total time spent in the appointment was 20 minutes  encounter with patients including review of chart and various tests results, discussions about plan of care and coordination of care plan   All questions were answered. The patient knows to call the clinic with any problems, questions or concerns. No barriers to learning was detected.  Mickiel Dry, MD 9/24/20255:23 PM   SUMMARY OF HEMATOLOGIC HISTORY: - CLL: RAI stage 0 -Patient has some night sweats and fatigue.  No other B symptoms. -CT abdomen pelvis from 2023 showed no evidence of hepatosplenomegaly -No thrombocytopenia or anemia -No palpable lymphadenopathy - CLL FISH: Trisomy 12: Positive, CCND1/IGH, ATM, 13 q., TP 53: Normal   INTERVAL HISTORY: Jeffrey Wheeler 88 y.o. male following for CLL. Patient was accompanied by his wife today.  He experiences significant fatigue, feeling constantly worn out and lacking energy, which affects his daily activities such as bathing. His caregiver notes low energy levels and reports two episodes of drenching night sweats this week. He takes a B12 supplement of 1000 mg daily due to previously low levels.  He suffers from severe back pain located in the middle of his back. He has a history of spinal stenosis in three areas and scoliosis. He reports that the pain makes it too painful to walk. He has undergone various treatments, including physical therapy, but still reports pain.  He has a history of constipation, requiring a strict regimen to manage bowel movements. His caregiver reports significant straining during bowel movements, leaving him exhausted.   He is scheduled for a sleep study in October due to a home test kit indicating his oxygen levels drop into the seventies during sleep, which may contribute to his fatigue.  I have reviewed the past medical history, past surgical history, social history and family history with the patient   ALLERGIES:  has no known allergies.  MEDICATIONS:  Current Outpatient Medications  Medication Sig  Dispense Refill   aspirin  EC 81 MG tablet Take 1 tablet (81 mg total) by mouth at bedtime. Restart on 08/27/20 30 tablet 11   docusate sodium  (COLACE) 100 MG capsule Take 300 mg by mouth at bedtime.     dorzolamide -timolol  (COSOPT ) 2-0.5 % ophthalmic solution Place 1 drop into both eyes 2 (two) times daily.     doxazosin  (CARDURA ) 8 MG tablet Take 8 mg by mouth at bedtime.      latanoprost  (XALATAN ) 0.005 % ophthalmic solution Place 1 drop into both eyes at bedtime.     MYRBETRIQ  50 MG TB24 tablet Take 1 tablet by mouth daily.     ondansetron  (ZOFRAN ) 4 MG tablet Take 1 tablet (4 mg total) by mouth every 8 (eight) hours as needed for nausea or vomiting. 10 tablet 0   pantoprazole  (PROTONIX ) 40 MG tablet Take 40 mg by mouth 2 (two) times daily before a meal.      prednisoLONE acetate (PRED FORTE) 1 % ophthalmic suspension Place 1 drop into the left eye 4 (four) times daily.     traMADol  (ULTRAM ) 50 MG tablet Take 1-2 tablets (50-100 mg total) by mouth every 6 (six) hours as needed for moderate pain or severe pain. 10 tablet 0   vitamin B-12 (CYANOCOBALAMIN ) 1000 MCG tablet Take 1,000 mcg by mouth at bedtime.     finasteride  (PROSCAR ) 5 MG tablet SMARTSIG:1 Tablet(s) By Mouth (Patient not taking: Reported on 06/09/2024)     No current facility-administered medications for this visit.     REVIEW OF SYSTEMS:   Constitutional: Denies fevers, chills or night sweats Eyes: Denies blurriness of vision Ears, nose, mouth, throat, and face: Denies mucositis or sore throat Respiratory: Denies cough, dyspnea or wheezes Cardiovascular: Denies palpitation, chest discomfort or lower extremity swelling Gastrointestinal:  Denies nausea, heartburn or change in bowel habits Skin: Denies abnormal skin rashes Lymphatics: Denies new lymphadenopathy or easy bruising Neurological:Denies numbness, tingling or new weaknesses Behavioral/Psych: Mood is stable, no new changes  All other systems were reviewed with the  patient and are negative.  PHYSICAL EXAMINATION:   Vitals:   06/09/24 1503  BP: 108/74  Pulse: 66  Resp: 20  Temp: (!) 96.6 F (35.9 C)  SpO2: 96%    GENERAL:alert, no distress and comfortable SKIN: skin color, texture, turgor are normal, no rashes or significant lesions LYMPH:  no palpable lymphadenopathy in the cervical, axillary or inguinal LUNGS: clear to auscultation and percussion with normal breathing effort HEART: regular rate & rhythm and no murmurs and no lower extremity edema ABDOMEN:abdomen soft, non-tender and normal bowel sounds Musculoskeletal:no cyanosis of digits and no clubbing  NEURO: alert & oriented x 3 with fluent speech  LABORATORY DATA:  I have reviewed the data as listed  Lab Results  Component Value Date   WBC 15.3 (H) 06/02/2024   NEUTROABS 5.3 06/02/2024   HGB 15.1 06/02/2024   HCT 43.8 06/02/2024   MCV 89.4  06/02/2024   PLT 277 06/02/2024        Chemistry      Component Value Date/Time   NA 133 (L) 06/02/2024 1344   K 4.1 06/02/2024 1344   CL 100 06/02/2024 1344   CO2 21 (L) 06/02/2024 1344   BUN 10 06/02/2024 1344   CREATININE 0.91 06/02/2024 1344      Component Value Date/Time   CALCIUM 9.3 06/02/2024 1344   ALKPHOS 91 06/02/2024 1344   AST 16 06/02/2024 1344   ALT 15 06/02/2024 1344   BILITOT 0.9 06/02/2024 1344      Latest Reference Range & Units 06/02/24 13:44  LDH 98 - 192 U/L 151    Latest Reference Range & Units 02/25/24 11:54  Sed Rate 0 - 20 mm/hr 13    Flow cytometry:  DIAGNOSIS:   Peripheral blood, flow cytometry:  -  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    RADIOGRAPHIC STUDIES: I have personally reviewed the radiological images as listed and agreed with the findings in the report.  None new to review

## 2024-06-14 DIAGNOSIS — L209 Atopic dermatitis, unspecified: Secondary | ICD-10-CM | POA: Diagnosis not present

## 2024-06-14 DIAGNOSIS — L820 Inflamed seborrheic keratosis: Secondary | ICD-10-CM | POA: Diagnosis not present

## 2024-06-14 DIAGNOSIS — L570 Actinic keratosis: Secondary | ICD-10-CM | POA: Diagnosis not present

## 2024-06-16 DIAGNOSIS — N401 Enlarged prostate with lower urinary tract symptoms: Secondary | ICD-10-CM | POA: Diagnosis not present

## 2024-06-16 DIAGNOSIS — R350 Frequency of micturition: Secondary | ICD-10-CM | POA: Diagnosis not present

## 2024-06-17 DIAGNOSIS — H903 Sensorineural hearing loss, bilateral: Secondary | ICD-10-CM | POA: Diagnosis not present

## 2024-07-08 DIAGNOSIS — J181 Lobar pneumonia, unspecified organism: Secondary | ICD-10-CM | POA: Diagnosis not present

## 2024-07-08 DIAGNOSIS — G912 (Idiopathic) normal pressure hydrocephalus: Secondary | ICD-10-CM | POA: Diagnosis not present

## 2024-07-08 DIAGNOSIS — G4733 Obstructive sleep apnea (adult) (pediatric): Secondary | ICD-10-CM | POA: Diagnosis not present

## 2024-07-08 DIAGNOSIS — F0150 Vascular dementia without behavioral disturbance: Secondary | ICD-10-CM | POA: Diagnosis not present

## 2024-07-08 DIAGNOSIS — Z23 Encounter for immunization: Secondary | ICD-10-CM | POA: Diagnosis not present

## 2024-07-13 ENCOUNTER — Ambulatory Visit (HOSPITAL_BASED_OUTPATIENT_CLINIC_OR_DEPARTMENT_OTHER): Attending: Pulmonary Disease | Admitting: Internal Medicine

## 2024-07-13 VITALS — Ht 71.0 in | Wt 218.0 lb

## 2024-07-13 DIAGNOSIS — G4733 Obstructive sleep apnea (adult) (pediatric): Secondary | ICD-10-CM | POA: Insufficient documentation

## 2024-07-13 DIAGNOSIS — H3589 Other specified retinal disorders: Secondary | ICD-10-CM | POA: Diagnosis not present

## 2024-07-18 NOTE — Procedures (Signed)
 [facility] [address] [place], [place] [address] Tel: [phone]   Fax: [phone]  Titration Interpretation  Patient Name:  Jeffrey Wheeler, Jeffrey Wheeler Date:  07/13/2024 Referring Physician:  PAULA SOUTHERLY [phone]) %%startinterp%% Indications for Polysomnography The patient is an 88 year old Male who is 5' 11 and weighs 218.0 lbs. His BMI equals 30.5.  A full night titration treatment study was performed. Baseline on file- AHI 26/hr.  Medication  No Data.   Polysomnogram Data A full night polysomnogram recorded the standard physiologic parameters including EEG, EOG, EMG, EKG, nasal and oral airflow.  Respiratory parameters of chest and abdominal movements were recorded with Respiratory Inductance Plethysmography belts.  Oxygen saturation was recorded by pulse oximetry.   Sleep Architecture The total recording time of the polysomnogram was 368.6 minutes.  The total sleep time was 269.5 minutes.  The patient spent 10.9% of total sleep time in Stage N1, 74.0% in Stage N2, 0.0% in Stages N3, and 15.0% in REM.  Sleep latency was 20.7 minutes.  REM latency was 196.0 minutes.  Sleep Efficiency was 73.1%.  Wake after Sleep Onset time was 78.0 minutes.  Titration Summary The patient was titrated at pressures ranging from 5/2* cm/H20 with supplemental oxygen at - up to 13/2* cm/H20 with supplemental oxygen at -.  The last pressure used in the study was 13/2* cm/H20 with supplemental oxygen at -.  Respiratory Events The polysomnogram revealed a presence of - obstructive, 9 centrals, and - mixed apneas resulting in an Apnea index of 2.0 events per hour.  There were 80 hypopneas (>=3% desaturation and/or arousal) resulting in an Apnea\Hypopnea Index (AHI >=3% desaturation and/or arousal) of 19.8 events per hour.  There were 61 hypopneas (>=4% desaturation) resulting in an Apnea\Hypopnea Index (AHI >=4% desaturation) of 15.6 events per hour.  There were 3  Respiratory Effort Related Arousals resulting in a RERA index of 0.7 events per hour. The Respiratory Disturbance Index is 20.5 events per hour.  The snore index was 0.2 events per hour.  Mean oxygen saturation was 92.2%.  The lowest oxygen saturation during sleep was 86.0%.  Time spent <=88% oxygen saturation was 10.4 minutes (2.8%).  Limb Activity There were 124 limb movements recorded.  Of this total, 124 were classified as PLMs.  Of the PLMs, 2 were associated with arousals.  The Limb Movement index was 27.6 per hour while the PLM index was 27.6 per hour.  Cardiac Summary The average pulse rate was 55.4 bpm.  The minimum pulse rate was 51.0 bpm while the maximum pulse rate was 79.0 bpm.  Cardiac rhythm was normal- occasional PVC.  Comments: CPAP titration to 13 cwp, EPR 2 with residual AHI(4%) 0/hr, desaturation to nadir 91%, mean 93%.  Diagnosis: Obstructive sleep apnea  Recommendations: Suggest autopap 10-18 or fixed CPAP 13. Patient wore a m3edium ResMed AirFit P 30i nasal pillow mask with heated humidity.   This study was personally reviewed and electronically signed by: Dr. Reggy Salt Accredited Board Certified in Sleep Medicine Date/Time: 07/18/24   12:53    %%endinterp%%  Titration Report  Patient Name: Jeffrey Wheeler, Jeffrey Wheeler Study Date: 07/13/2024  Date of Birth: 27-Mar-1936 Study Type: CPAP Titration  Age: 67 year MRN #: [id number]  Sex: Male Interpreting Physician: SALT REGGY, [id number]  Height: 5' 11 Referring Physician: PAULA SOUTHERLY [phone])  Weight: 218.0 lbs Recording Tech: Dewane Hacker CRT RPSGT RST  BMI: 30.5 Scoring Tech: Dewane Hacker CRT RPSGT RST  ESS: 7 Neck Size: 17.5  Mask Type RESMED AIRFIT P30i Final Pressure: 13cmH2O  Mask Size: MEDIUM Supplemental O2: N/A   Study Overview  Lights Off: 10:57:08 PM  Count Index  Lights On: 05:05:45 AM Awakenings: 24 5.3  Time in Bed: 368.6 min. Arousals: 44 9.8  Total Sleep Time: 269.5 min. AHI (>=3% Desat and/or  Ar.): 89 19.8   Sleep Efficiency: 73.1% AHI (>=4% Desat): 70 15.6   Sleep Latency: 20.7 min. Limb Movements: 124 27.6  Wake After Sleep Onset: 78.0 min. Snore: 1 0.2  REM Latency from Sleep Onset: 196.0 min. Desaturations: 118 26.3     Minimum SpO2 TST: 86.0%    Sleep Architecture  % of Time in Bed Stages Time (mins) % Sleep Time  Wake 99.0   Stage N1 29.5 10.9%  Stage N2 199.5 74.0%  Stage N3 0.0 0.0%  REM 40.5 15.0%   Arousal Summary   NREM REM Sleep Index  Respiratory Arousals 12 1 13  2.9  PLM Arousals 2 - 2 0.4  Isolated Limb Movement Arousals - - - -  Snore Arousals - - - -  Spontaneous Arousals 26 3 29  6.5  Total 40 4 44 9.8   Limb Movement Summary   Count Index  Isolated Limb Movements - -  Periodic Limb Movements (PLMs) 124 27.6  Total Limb Movements 124 27.6    Respiratory Summary   By Sleep Stage By Body Position Total   NREM REM Supine Non-Supine   Time (min) 229.0 40.5 267.5 2.0 269.5         Obstructive Apnea - - - - -  Mixed Apnea - - - - -  Central Apnea 5 4 9  - 9  Total Apneas 5 4 9  - 9  Total Apnea Index 1.3 5.9 2.0 - 2.0         Hypopneas (>=3% Desat and/or Ar.) 74 6 79 1 80  AHI (>=3% Desat and/or Ar.) 20.7 14.8 19.7 30.0 19.8         Hypopneas (>=4% Desat) 58 3 60 1 61  AHI (>=4% Desat) 16.5 10.4 15.5 30.0 15.6          RERAs 2 1 3  - 3  RERA Index 0.5 1.5 0.7 - 0.7         RDI 21.2 16.3 20.4 30.0 20.5     Respiratory Event Durations   Apnea Hypopnea   NREM REM NREM REM  Average (seconds) 23.3 11.6 35.5 25.4  Maximum (seconds) 30.1 15.0 48.4 38.0    Oxygen Saturation Summary   Wake NREM REM TST TIB  Average SpO2 92.2% 92.2% 92.8% 92.3% 92.2%  Minimum SpO2 86.0% 86.0% 90.0% 86.0% 86.0%  Maximum SpO2 97.0% 96.0% 95.0% 96.0% 97.0%   Oxygen Saturation Distribution  Range (%) Time in range (min) Time in range (%)  90.0 - 100.0 320.8 87.4%  80.0 - 90.0 46.4 12.6%  70.0 - 80.0 - -  60.0 - 70.0 - -  50.0 - 60.0 - -  0.0 -  50.0 - -  Time Spent <=88% SpO2  Range (%) Time in range (min) Time in range (%)  0.0 - 88.0 10.4 2.8%      Count Index  Desaturations 118 26.3    Cardiac Summary   Wake NREM REM Sleep Total  Average Pulse Rate (BPM) 56.8 54.8 55.5 54.9 55.4  Minimum Pulse Rate (BPM) 52.0 51.0 52.0 51.0 51.0  Maximum Pulse Rate (BPM) 79.0 64.0 62.0 64.0 79.0   Pulse Rate Distribution:  Range (bpm)  Time in range (min) Time in range (%)  0.0 - 40.0 - -  40.0 - 60.0 357.4 97.3%  60.0 - 80.0 5.0 1.4%  80.0 - 100.0 - -  100.0 - 120.0 - -  120.0 - 140.0 - -  140.0 - 200.0 - -   Titration Summary  PAP Device PAP Level O2 Level Time (min) Wake (min) NREM (min) REM (min) Supine TST (min) Sleep Eff% OA# CA# MA# Hyp# (>=3%) AHI (>=3%) Hyp# (>=4%) AHI (>=%4) RERA RDI SpO2 <=88% (min) Min SpO2 Mean SpO2 Ar. Index  - Off - 0.5 0.5 0.0 0.0  0.0 0.0%               CPAP EPR 5/2 - 67.0 42.0 25.0 0.0  23.0 37.3% - 2 - 26 67.2 26  67.2 -  67.2  3.7 86.0 91.2 40.8  CPAP EPR 7/2 - 16.5 0.0 16.5 0.0  16.5 100.0% - - - 15 54.5 11  40.0 1  58.2  2.0 87.0 90.2 3.6  CPAP EPR 9/2 - 83.5 50.0 33.5 0.0  33.5 40.1% - 3 - 17 35.8 15  32.2 -  35.8  0.4 87.0 92.2 17.9  CPAP EPR 11/2 - 79.5 2.5 52.0 25.0  77.0 96.9% - 3 - 10 10.1 5  6.2 2  11.7  0.0 90.0 92.4 6.2  CPAP EPR 12/2 - 75.0 4.0 57.5 13.5  71.0 94.7% - 1 - 10 9.3 4  4.2 -  9.3  0.0 89.0 92.5 3.4  CPAP EPR 13/2 - 47.0 0.0 44.5 2.0  46.5 98.9% - - - 2 2.6 -  - -  2.6  0.0 91.0 93.0 5.2    Hypnograms                           Technologist Comments  THE 59-YEAR-OLD MALE PATIENT PRESENTED TO THE SLEEP DISORDER CENTER IN A WHEELCHAIR, ACCOMPANIED BY HIS WIFE, FOR A CPAP TITRATION STUDY WITH A CHIEF COMPLAINT OF OSA. THE PATIENT WAS FITTED WITH A MEDIUM RESMED AIRFIT P30i NASAL PILLOW MASK FOR PRE-CPAP TRIAL, WHICH WAS TOLERATED WELL. NO BEDTIME MEDICATIONS WERE SELF ADMINISTERED. THE CPAP TITRATION WAS EXPLAINED, THE LEAD PLACEMENT WAS  INITIATED, THEN THE STUDY WAS BEGUN. THE CPAP TRIAL WAS INITIATED USING THE ABOVE MASK TYPE ON A PRESSURE OF 5cmH2O WITH AN EPR OF 2 AND HEATED HUMIDIFICATION. THE CPAP PRESSURE WAS INCREASED FOR RESPIRATORY EVENTS AND RERAs UNTIL OPTIMAL PRESSURE WAS ACHIEVED. PLMs - PLMAs WERE NOTED. SUPPLEMENTAL OXYGEN WAS NOT WARRANTED DURING THE STUDY. THE PATIENT USED THE URINAL ONCE. QUESTIONABLE CARDIAC ARRHYTHMIAS WERE OBSERVED THROUGHOUT THE ENTIRE STUDY. PLEASE SEE ATTCHED SHEETS. NO OBVIOUS PARASOMNIAs WERE OBSERVED. THE PATIENT TOLERATED THE CPAP TRIAL WELL.                          Reggy Salt Diplomate, Biomedical Engineer of Sleep Medicine  ELECTRONICALLY SIGNED ON:  07/18/2024, 12:45 PM [facility] PH: [phone]   FX: [phone] ACCREDITED BY THE AMERICAN ACADEMY OF SLEEP MEDICINE

## 2024-07-18 NOTE — Procedures (Addendum)
  Indications for Polysomnography The patient is an 88 year old Male who is 5' 11 and weighs 218.0 lbs. His BMI equals 30.5.  A full night titration treatment study was performed.  MedicationNo Data. Polysomnogram Data A full night polysomnogram recorded the standard physiologic parameters including EEG, EOG, EMG, EKG, nasal and oral airflow.  Respiratory parameters of chest and abdominal movements were recorded with Respiratory Inductance Plethysmography belts.   Oxygen saturation was recorded by pulse oximetry.  Sleep Architecture The total recording time of the polysomnogram was 368.6 minutes.  The total sleep time was 269.5 minutes.  The patient spent 10.9% of total sleep time in Stage N1, 74.0% in Stage N2, 0.0% in Stages N3, and 15.0% in REM.  Sleep latency was 20.7 minutes.   REM latency was 196.0 minutes.  Sleep Efficiency was 73.1%.  Wake after Sleep Onset time was 78.0 minutes.  Titration Summary The patient was titrated at pressures ranging from 5/2* cm/H20 with supplemental oxygen at - up to 13/2* cm/H20 with supplemental oxygen at -.  The last pressure used in the study was 13/2* cm/H20 with supplemental oxygen at -.  Respiratory Events The polysomnogram revealed a presence of - obstructive, 9 centrals, and - mixed apneas resulting in an Apnea index of 2.0 events per hour.  There were 80 hypopneas (GreaterEqual to3% desaturation and/or arousal) resulting in an Apnea\Hypopnea Index (AHI  GreaterEqual to3% desaturation and/or arousal) of 19.8 events per hour.  There were 61 hypopneas (GreaterEqual to4% desaturation) resulting in an Apnea\Hypopnea Index (AHI GreaterEqual to4% desaturation) of 15.6 events per hour.  There were 3 Respiratory  Effort Related Arousals resulting in a RERA index of 0.7 events per hour. The Respiratory Disturbance Index is 20.5 events per hour.  The snore index was 0.2 events per hour.  Mean oxygen saturation was 92.2%.  The lowest oxygen saturation during  sleep was 86.0%.  Time spent LessEqual to88% oxygen saturation was  minutes ().  Limb Activity There were 124 limb movements recorded.  Of this total, 124 were classified as PLMs.  Of the PLMs, 2 were associated with arousals.  The Limb Movement index was 27.6 per hour while the PLM index was 27.6 per hour.  Cardiac Summary The average pulse rate was 55.4 bpm.  The minimum pulse rate was 51.0 bpm while the maximum pulse rate was 79.0 bpm.  Cardiac rhythm was normal/abnormal.  Comments:  Diagnosis:  Recommendations:   This study was personally reviewed and electronically signed by: Dr. Reggy Salt Accredited Board Certified in Sleep Medicine Date/Time:

## 2024-08-09 NOTE — Progress Notes (Signed)
 am  Established Patient Pulmonology Office Visit   Subjective:  Patient ID: Jeffrey Wheeler, male    DOB: 1936-07-20  MRN: [id number]  CC:  Chief Complaint  Patient presents with   Sleep Apnea    Titration study - no cpap     HPI  Jeffrey Wheeler is a 88 y.o. male with PMH of HTN, CLL, and obstructive sleep apnea who presents for follow up.  He was last seen on 05/19/2024, I recommended CPAP titration test.   Discussed the use of AI scribe software for clinical note transcription with the patient, who gave verbal consent to proceed.  History of Present Illness Jeffrey Wheeler is an 88 year old male who presents with sleep apnea management.  He has been diagnosed with sleep apnea following a sleep study and underwent a CPAP titration test to determine the appropriate pressure settings for his CPAP machine. He experiences difficulty sleeping, usually going to bed around 9:30 PM but lying awake for three to four hours before falling asleep. He rarely snores and has not noticed any episodes of apnea at night, although the sleep study indicated otherwise. He is currently not using a CPAP machine at home but found nasal pillows comfortable during the titration test.  He reports a recent episode of confusion during the day, which has been a concern for the past five days. No inappropriate conversation has been noted during the night.  He experiences constipation and requires an extensive regimen to manage bowel movements, including the use of Symbicort, Colace, and occasional use of a '[place] brown pill.' He takes one tramadol  a day and maintains good hydration.  There is a significant difference in blood pressure readings between his arms, with the left arm showing 74/49 mmHg and the right arm showing 105/?. He feels dizzy. He does not take any blood pressure medication and has not been diagnosed with hypertension.  He lives near [place], [place] , and prefers to stay within his comfort zone,  avoiding travel to family gatherings.     05/19/2024    8:00 AM  Results of the Epworth flowsheet  Sitting and reading 1  Watching TV 3  Sitting, inactive in a public place (e.g. a theatre or a meeting) 0  As a passenger in a car for an hour without a break 1  Lying down to rest in the afternoon when circumstances permit 3  Sitting and talking to someone 0  Sitting quietly after a lunch without alcohol 3  In a car, while stopped for a few minutes in traffic 0  Total score 11   ROS    Current Outpatient Medications:    aspirin  EC 81 MG tablet, Take 1 tablet (81 mg total) by mouth at bedtime. Restart on 08/27/20, Disp: 30 tablet, Rfl: 11   docusate sodium  (COLACE) 100 MG capsule, Take 300 mg by mouth at bedtime., Disp: , Rfl:    dorzolamide -timolol  (COSOPT ) 2-0.5 % ophthalmic solution, Place 1 drop into both eyes 2 (two) times daily., Disp: , Rfl:    doxazosin  (CARDURA ) 8 MG tablet, Take 8 mg by mouth at bedtime. , Disp: , Rfl:    latanoprost  (XALATAN ) 0.005 % ophthalmic solution, Place 1 drop into both eyes at bedtime., Disp: , Rfl:    MYRBETRIQ  50 MG TB24 tablet, Take 1 tablet by mouth daily., Disp: , Rfl:    ondansetron  (ZOFRAN ) 4 MG tablet, Take 1 tablet (4 mg total) by mouth every 8 (eight) hours as needed for nausea or vomiting., Disp: 10 tablet,  Rfl: 0   pantoprazole  (PROTONIX ) 40 MG tablet, Take 40 mg by mouth 2 (two) times daily before a meal. , Disp: , Rfl:    prednisoLONE acetate (PRED FORTE) 1 % ophthalmic suspension, Place 1 drop into the left eye 4 (four) times daily., Disp: , Rfl:    traMADol  (ULTRAM ) 50 MG tablet, Take 1-2 tablets (50-100 mg total) by mouth every 6 (six) hours as needed for moderate pain or severe pain., Disp: 10 tablet, Rfl: 0   vitamin B-12 (CYANOCOBALAMIN ) 1000 MCG tablet, Take 1,000 mcg by mouth at bedtime., Disp: , Rfl:    finasteride  (PROSCAR ) 5 MG tablet, SMARTSIG:1 Tablet(s) By Mouth (Patient not taking: Reported on 08/10/2024), Disp: , Rfl:        Objective:  BP 105/68   Pulse (!) 107   Ht 5' 11 (1.803 m)   Wt 218 lb 3.2 oz (99 kg)   SpO2 92% Comment: ra  BMI 30.43 kg/m  Wt Readings from Last 3 Encounters:  08/10/24 218 lb 3.2 oz (99 kg)  07/13/24 218 lb (98.9 kg)  06/09/24 215 lb (97.5 kg)   BMI Readings from Last 3 Encounters:  08/10/24 30.43 kg/m  07/13/24 30.40 kg/m  06/09/24 30.85 kg/m   SpO2 Readings from Last 3 Encounters:  08/10/24 92%  06/09/24 96%  05/19/24 94%    Physical Exam General: NAD, alert, WD, WN Eyes: PERRL, no scleral icterus ENMT: oropharynx clear, good dentition, no oral lesions, mallampati score IV Skin: warm, intact, no rashes Neck: neck circ 17.5 inches CV: RRR, no MRG, nl S1 and S2, no peripheral edema Resp: clear to auscultation bilaterally, no wheezes, rales, or rhonchi, normal effort, no clubbing/cyanosis Neuro: Awake alert oriented to person place time and situation  Diagnostic Review:  Last CBC Lab Results  Component Value Date   WBC 15.3 (H) 06/02/2024   HGB 15.1 06/02/2024   HCT 43.8 06/02/2024   MCV 89.4 06/02/2024   MCH 30.8 06/02/2024   RDW 13.8 06/02/2024   PLT 277 06/02/2024   Last metabolic panel Lab Results  Component Value Date   GLUCOSE 122 (H) 06/02/2024   NA 133 (L) 06/02/2024   K 4.1 06/02/2024   CL 100 06/02/2024   CO2 21 (L) 06/02/2024   BUN 10 06/02/2024   CREATININE 0.91 06/02/2024   GFRNONAA >60 06/02/2024   CALCIUM 9.3 06/02/2024   PROT 6.9 06/02/2024   ALBUMIN 3.7 06/02/2024   LABGLOB 2.7 08/04/2020   BILITOT 0.9 06/02/2024   ALKPHOS 91 06/02/2024   AST 16 06/02/2024   ALT 15 06/02/2024   ANIONGAP 12 06/02/2024    Underwent HST on 04/22/24, found to have severe sleep apnea with AHI 26. He had significant desaturation with SPO2 < 88% or 10 minutes. SPO2 nadir was 79%.   CPAP Titration: Comments: CPAP titration to 13 cwp, EPR 2 with residual AHI(4%) 0/hr, desaturation to nadir 91%, mean 93%.   Diagnosis: Obstructive sleep apnea    Recommendations: Suggest autopap 10-18 or fixed CPAP 13. Patient wore a m3edium ResMed AirFit P 30i nasal pillow mask with heated humidity.     Assessment & Plan:   Assessment & Plan Obstructive sleep apnea Assessment and Plan Assessment & Plan Obstructive sleep apnea Confirmed by sleep study. CPAP titration determined effective pressure. Oxygen levels adequate with CPAP. Nasal pillows preferred. - Prescribed CPAP machine with nasal pillows. - Coordinated with [facility] in [place] for CPAP delivery. - Scheduled follow-up in three months to assess CPAP efficacy. Blood pressure alteration  Significant blood pressure discrepancy between arms suggests possible vascular issue. Concerns about vascular dementia due to recent confusion. - Rechecked blood pressure in both arms. - Advised keeping a daily blood pressure diary for both arms. - Instructed to discuss with Dr. Sheryle regarding potential vascular issues and further evaluation, including possible CT scan with contrast of head, neck, and chest. - I rechecked patient's BP with manual cuff and patient's BP was 84/60 on R arm and 90/60 on L arm. The patient has no symptoms and he is not dizzy. I discussed with patient and wife to keep an eye on his BP and to keep a diary of it. If patient becomes lightheaded or has peri-syncope or syncope to go to the ED. I advised patient to increase fluid intake today.  Orders Placed This Encounter  Procedures   AMB REFERRAL FOR DME   I spent 40 minutes reviewing patient's chart including prior consultant notes, imaging, and PFTs as well as face-to-face with the patient, over half in discussion of the diagnosis and the importance of compliance with the treatment plan.  Return in about 3 months (around 11/10/2024).   Samia Kukla, MD

## 2024-08-10 ENCOUNTER — Ambulatory Visit: Admitting: Pulmonary Disease

## 2024-08-10 ENCOUNTER — Telehealth: Payer: Self-pay

## 2024-08-10 ENCOUNTER — Encounter: Payer: Self-pay | Admitting: Pulmonary Disease

## 2024-08-10 VITALS — BP 105/68 | HR 107 | Ht 71.0 in | Wt 218.2 lb

## 2024-08-10 DIAGNOSIS — G4733 Obstructive sleep apnea (adult) (pediatric): Secondary | ICD-10-CM

## 2024-08-10 DIAGNOSIS — R6889 Other general symptoms and signs: Secondary | ICD-10-CM

## 2024-08-10 NOTE — Telephone Encounter (Signed)
 Atc per alghanim to inform pt if hi BP is very low to go to the ED

## 2024-08-10 NOTE — Patient Instructions (Signed)
  VISIT SUMMARY: Today, we discussed the management of your sleep apnea and addressed concerns about your blood pressure and recent confusion. You will start using a CPAP machine with nasal pillows, and we have coordinated its delivery. We also discussed the significant difference in blood pressure readings between your arms and the need for further evaluation.  YOUR PLAN: OBSTRUCTIVE SLEEP APNEA: You have been diagnosed with sleep apnea, and a CPAP titration test determined the appropriate pressure settings for your CPAP machine. -You will start using a CPAP machine with nasal pillows. -We have coordinated with [facility] in [place] for the delivery of your CPAP machine. -We will have a follow-up appointment in three months to assess how well the CPAP is working for you.  ESSENTIAL HYPERTENSION: There is a significant difference in blood pressure readings between your arms, which may indicate a vascular issue. Your recent confusion is also a concern. -We rechecked your blood pressure in both arms. -Keep a daily blood pressure diary for both arms. -Discuss with Dr. Sheryle about potential vascular issues and further evaluation, including a possible CT scan with contrast of your head, neck, and chest.  Contains text generated by [employer].

## 2024-08-24 NOTE — Telephone Encounter (Signed)
 LVM for Mrs. Mendel to return my call regarding Mr. Lal's CPAP machine

## 2024-08-24 NOTE — Telephone Encounter (Signed)
 Copied from CRM #[id number]. Topic: Clinical - Order For Equipment >> Aug 23, 2024 11:46 AM [name] wrote: Reason for CRM: Spouse calling on behalf of patient - was told by [facility] that the office hasn't been responding, they have been requesting notes before patient can get his equipment. Spouse is adamant about having this completed today. Spouse states patient can't sleep, she is expecting a phone call from somebody in the office today because she is angry.   Callback number: [phone] >> Aug 23, 2024  3:53 PM [name] wrote: Patients wife Nichole is upset that no one contacted her today to complete the request as the patient has not and cannot sleep and she is very worried about him. Please provide necessary documentation to [facility] so that the patient can get his equipment.   Atc x1 lmtcb but routing to [facility] to handle due to cpap issues - order has been placed on our end to [employer]

## 2024-08-25 NOTE — Telephone Encounter (Signed)
 Spoke with Mrs. Kirlin and informed her I could not find any requests from [facility] for additional information for Mr. Dimaio I faxed the most recent office note to [facility] yesterday 08/24/24.  Requested patient call back if there are any future issues with [facility] (ask to speak with me).  She voiced her understanding and was very grateful for my help

## 2024-09-13 ENCOUNTER — Telehealth (HOSPITAL_BASED_OUTPATIENT_CLINIC_OR_DEPARTMENT_OTHER): Payer: Self-pay

## 2024-09-13 NOTE — Telephone Encounter (Signed)
 CMN received for CPAP supplies sent to [facility] signed by provider and faxed confirmation received

## 2024-09-15 ENCOUNTER — Inpatient Hospital Stay

## 2024-09-15 ENCOUNTER — Inpatient Hospital Stay: Admitting: Oncology

## 2024-09-29 ENCOUNTER — Inpatient Hospital Stay: Admitting: Oncology

## 2024-09-29 ENCOUNTER — Inpatient Hospital Stay: Attending: Oncology

## 2024-09-29 VITALS — BP 93/70 | HR 73 | Temp 97.5°F | Resp 16 | Wt 219.4 lb

## 2024-09-29 DIAGNOSIS — C9110 Chronic lymphocytic leukemia of B-cell type not having achieved remission: Secondary | ICD-10-CM | POA: Diagnosis not present

## 2024-09-29 LAB — CBC WITH DIFFERENTIAL/PLATELET
Basophils Absolute: 0 K/uL (ref 0.0–0.1)
Basophils Relative: 0 %
Eosinophils Absolute: 0.2 K/uL (ref 0.0–0.5)
Eosinophils Relative: 1 %
HCT: 42.4 % (ref 39.0–52.0)
Hemoglobin: 14.1 g/dL (ref 13.0–17.0)
Lymphocytes Relative: 64 %
Lymphs Abs: 10.1 K/uL — ABNORMAL HIGH (ref 0.7–4.0)
MCH: 30.3 pg (ref 26.0–34.0)
MCHC: 33.3 g/dL (ref 30.0–36.0)
MCV: 91 fL (ref 80.0–100.0)
Monocytes Absolute: 1.7 K/uL — ABNORMAL HIGH (ref 0.1–1.0)
Monocytes Relative: 11 %
Neutro Abs: 3.8 K/uL (ref 1.7–7.7)
Neutrophils Relative %: 24 %
Platelets: 222 K/uL (ref 150–400)
RBC: 4.66 MIL/uL (ref 4.22–5.81)
RDW: 14.1 % (ref 11.5–15.5)
Smear Review: NORMAL
WBC: 15.8 K/uL — ABNORMAL HIGH (ref 4.0–10.5)
nRBC: 0 % (ref 0.0–0.2)

## 2024-09-29 LAB — COMPREHENSIVE METABOLIC PANEL WITH GFR
ALT: 12 U/L (ref 0–44)
AST: 17 U/L (ref 15–41)
Albumin: 3.8 g/dL (ref 3.5–5.0)
Alkaline Phosphatase: 89 U/L (ref 38–126)
Anion gap: 12 (ref 5–15)
BUN: 14 mg/dL (ref 8–23)
CO2: 19 mmol/L — ABNORMAL LOW (ref 22–32)
Calcium: 9.3 mg/dL (ref 8.9–10.3)
Chloride: 106 mmol/L (ref 98–111)
Creatinine, Ser: 1 mg/dL (ref 0.61–1.24)
GFR, Estimated: >60 mL/min
Glucose, Bld: 132 mg/dL — ABNORMAL HIGH (ref 70–99)
Potassium: 4.5 mmol/L (ref 3.5–5.1)
Sodium: 137 mmol/L (ref 135–145)
Total Bilirubin: 0.5 mg/dL (ref 0.0–1.2)
Total Protein: 6.8 g/dL (ref 6.5–8.1)

## 2024-09-29 LAB — LACTATE DEHYDROGENASE: LDH: 218 U/L (ref 105–235)

## 2024-09-29 NOTE — Progress Notes (Signed)
 " [facility] at [facility]  HEMATOLOGY FOLLOW-UP VISIT  Sheryle Carwin, MD  REASON FOR FOLLOW-UP: CLL  ASSESSMENT & PLAN:  Patient is a 89 y.o. male following for CLL  CLL (chronic lymphocytic leukemia)  Patient with chronic leukocytosis present since 2015 RAI stage 0.  Patient has some night sweats.  No other B symptoms. CT abdomen pelvis from 2023 showed no evidence of hepatosplenomegaly No thrombocytopenia or anemia No palpable lymphadenopathy   -Patient's wife reports stable B symptoms.  Discussed starting treatment if the symptoms worsen.  The patient and wife agreed to wait and watch at this time. -Labs reviewed today: CMP: Normal creatinine, normal LFTs, CBC: WBC: 15.8-stable, hemoglobin: 14.1, platelets: 222.  LDH: 218. - Will monitor counts every 6 months and assess for lymphadenopathy, hepatosplenomegaly - Patient does not meet criteria for treatment at this time.  If at all he has worsening B symptoms, can consider treating with Zanubrutinib starting at a low dose of 80 mg BID.   Return to clinic in 6 months with labs   Orders Placed This Encounter  Procedures   CBC with Differential/Platelet    Standing Status:   Future    Expected Date:   03/28/2025    Expiration Date:   06/26/2025   Comprehensive metabolic panel with GFR    Standing Status:   Future    Expected Date:   03/28/2025    Expiration Date:   06/26/2025   Lactate dehydrogenase    Standing Status:   Future    Expected Date:   03/28/2025    Expiration Date:   06/26/2025    The total time spent in the appointment was 15 minutes encounter with patients including review of chart and various tests results, discussions about plan of care and coordination of care plan   All questions were answered. The patient knows to call the clinic with any problems, questions or concerns. No barriers to learning was detected.  Mickiel Dry, MD 1/14/20262:25 PM   SUMMARY OF HEMATOLOGIC  HISTORY: - CLL: RAI stage 0 -Patient has some night sweats and fatigue.  No other B symptoms. -CT abdomen pelvis from 2023 showed no evidence of hepatosplenomegaly -No thrombocytopenia or anemia -No palpable lymphadenopathy - CLL FISH: Trisomy 12: Positive, CCND1/IGH, ATM, 13 q., TP 53: Normal   INTERVAL HISTORY: Discussed the use of AI scribe software for clinical note transcription with the patient, who gave verbal consent to proceed.  History of Present Illness Jeffrey Wheeler is an 89 year old male with chronic lymphocytic leukemia, B-cell type, who presents for routine hematology follow-up. He is accompanied by his wife.  He reports feeling generally well and denies recent fevers. He experienced a single episode of chills in the past week, which resolved spontaneously and has not recurred. Night sweats persist but are stable in severity and do not disrupt sleep. He describes intermittent episodes of feeling overheated that resolve without intervention.   I have reviewed the past medical history, past surgical history, social history and family history with the patient   ALLERGIES:  has no known allergies.  MEDICATIONS:  Current Outpatient Medications  Medication Sig Dispense Refill   aspirin  EC 81 MG tablet Take 1 tablet (81 mg total) by mouth at bedtime. Restart on 08/27/20 30 tablet 11   docusate sodium  (COLACE) 100 MG capsule Take 300 mg by mouth at bedtime.     dorzolamide -timolol  (COSOPT ) 2-0.5 % ophthalmic solution Place 1 drop into both eyes 2 (two) times daily.  doxazosin  (CARDURA ) 8 MG tablet Take 8 mg by mouth at bedtime.      MYRBETRIQ  50 MG TB24 tablet Take 1 tablet by mouth daily.     pantoprazole  (PROTONIX ) 40 MG tablet Take 40 mg by mouth 2 (two) times daily before a meal.      traMADol  (ULTRAM ) 50 MG tablet Take 1-2 tablets (50-100 mg total) by mouth every 6 (six) hours as needed for moderate pain or severe pain. 10 tablet 0   vitamin B-12 (CYANOCOBALAMIN ) 1000  MCG tablet Take 1,000 mcg by mouth at bedtime.     No current facility-administered medications for this visit.     PHYSICAL EXAMINATION:   Vitals:   09/29/24 1401  BP: 93/70  Pulse: 73  Resp: 16  Temp: (!) 97.5 F (36.4 C)  SpO2: 94%     GENERAL:alert, no distress and comfortable SKIN: skin color, texture, turgor are normal, no rashes or significant lesions LYMPH:  no palpable lymphadenopathy in the cervical, axillary or inguinal LUNGS: clear to auscultation and percussion with normal breathing effort HEART: regular rate & rhythm and no murmurs and no lower extremity edema ABDOMEN:abdomen soft, non-tender and normal bowel sounds Musculoskeletal:no cyanosis of digits and no clubbing  NEURO: alert & oriented x 3 with fluent speech  LABORATORY DATA:  I have reviewed the data as listed  Lab Results  Component Value Date   WBC 15.8 (H) 09/29/2024   NEUTROABS 3.8 09/29/2024   HGB 14.1 09/29/2024   HCT 42.4 09/29/2024   MCV 91.0 09/29/2024   PLT 222 09/29/2024        Chemistry      Component Value Date/Time   NA 137 09/29/2024 1308   K 4.5 09/29/2024 1308   CL 106 09/29/2024 1308   CO2 19 (L) 09/29/2024 1308   BUN 14 09/29/2024 1308   CREATININE 1.00 09/29/2024 1308      Component Value Date/Time   CALCIUM 9.3 09/29/2024 1308   ALKPHOS 89 09/29/2024 1308   AST 17 09/29/2024 1308   ALT 12 09/29/2024 1308   BILITOT 0.5 09/29/2024 1308      Latest Reference Range & Units 09/29/24 13:08  LDH 105 - 235 U/L 218    Flow cytometry:  DIAGNOSIS:   Peripheral blood, flow cytometry:  -  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    RADIOGRAPHIC STUDIES: I have personally reviewed the radiological images as listed and agreed with the findings in the report.  None new to review    "

## 2024-09-29 NOTE — Patient Instructions (Signed)
 [facility] at [facility] Discharge Instructions   You were seen and examined today by Dr. Davonna.  She reviewed the results of your lab work which are normal/stable.   We will see you back in 6 months.   Return as scheduled.    Thank you for choosing [facility] at [facility] to provide your oncology and hematology care.  To afford each patient quality time with our provider, please arrive at least 15 minutes before your scheduled appointment time.   If you have a lab appointment with the Cancer Center please come in thru the Main Entrance and check in at the main information desk.  You need to re-schedule your appointment should you arrive 10 or more minutes late.  We strive to give you quality time with our providers, and arriving late affects you and other patients whose appointments are after yours.  Also, if you no show three or more times for appointments you may be dismissed from the clinic at the providers discretion.     Again, thank you for choosing [facility].  Our hope is that these requests will decrease the amount of time that you wait before being seen by our physicians.       _____________________________________________________________  Should you have questions after your visit to [facility], please contact our office at [phone] and follow the prompts.  Our office hours are 8:00 a.m. and 4:30 p.m. Monday - Friday.  Please note that voicemails left after 4:00 p.m. may not be returned until the following business day.  We are closed weekends and major holidays.  You do have access to a nurse 24-7, just call the main number to the clinic [phone] and do not press any options, hold on the line and a nurse will answer the phone.    For prescription refill requests, have your pharmacy contact our office and allow 72 hours.    Due to Covid, you will need to wear a mask upon entering the  hospital. If you do not have a mask, a mask will be given to you at the Main Entrance upon arrival. For doctor visits, patients may have 1 support person age 28 or older with them. For treatment visits, patients can not have anyone with them due to social distancing guidelines and our immunocompromised population.

## 2024-11-24 ENCOUNTER — Ambulatory Visit: Admitting: Pulmonary Disease

## 2024-12-01 ENCOUNTER — Other Ambulatory Visit

## 2024-12-08 ENCOUNTER — Ambulatory Visit: Admitting: Physician Assistant

## 2025-03-21 ENCOUNTER — Inpatient Hospital Stay

## 2025-03-22 ENCOUNTER — Inpatient Hospital Stay

## 2025-03-29 ENCOUNTER — Inpatient Hospital Stay: Admitting: Physician Assistant
